# Patient Record
Sex: Female | Born: 1940 | Race: Black or African American | Hispanic: No | Marital: Single | Smoking: Never smoker
Health system: Southern US, Community
[De-identification: ages and names within clinical notes are randomized; demographics above are authoritative.]

## PROBLEM LIST (undated history)

## (undated) DIAGNOSIS — D509 Iron deficiency anemia, unspecified: Secondary | ICD-10-CM

## (undated) DIAGNOSIS — E785 Hyperlipidemia, unspecified: Secondary | ICD-10-CM

## (undated) DIAGNOSIS — E119 Type 2 diabetes mellitus without complications: Secondary | ICD-10-CM

## (undated) DIAGNOSIS — I10 Essential (primary) hypertension: Secondary | ICD-10-CM

## (undated) DIAGNOSIS — M1990 Unspecified osteoarthritis, unspecified site: Secondary | ICD-10-CM

## (undated) DIAGNOSIS — I2510 Atherosclerotic heart disease of native coronary artery without angina pectoris: Secondary | ICD-10-CM

## (undated) DIAGNOSIS — I2119 ST elevation (STEMI) myocardial infarction involving other coronary artery of inferior wall: Secondary | ICD-10-CM

## (undated) HISTORY — DX: Atherosclerotic heart disease of native coronary artery without angina pectoris: I25.10

## (undated) HISTORY — DX: Hyperlipidemia, unspecified: E78.5

## (undated) HISTORY — PX: CATARACT EXTRACTION: SUR2

## (undated) HISTORY — DX: Iron deficiency anemia, unspecified: D50.9

---

## 2007-06-21 ENCOUNTER — Ambulatory Visit: Payer: Self-pay | Admitting: Internal Medicine

## 2007-10-12 DIAGNOSIS — M1710 Unilateral primary osteoarthritis, unspecified knee: Secondary | ICD-10-CM

## 2007-10-12 DIAGNOSIS — K029 Dental caries, unspecified: Secondary | ICD-10-CM | POA: Insufficient documentation

## 2007-10-13 ENCOUNTER — Encounter (INDEPENDENT_AMBULATORY_CARE_PROVIDER_SITE_OTHER): Payer: Self-pay | Admitting: Internal Medicine

## 2007-10-13 ENCOUNTER — Telehealth (INDEPENDENT_AMBULATORY_CARE_PROVIDER_SITE_OTHER): Payer: Self-pay | Admitting: Internal Medicine

## 2007-10-27 ENCOUNTER — Ambulatory Visit: Payer: Self-pay | Admitting: Internal Medicine

## 2007-10-27 DIAGNOSIS — M79609 Pain in unspecified limb: Secondary | ICD-10-CM | POA: Insufficient documentation

## 2008-05-30 ENCOUNTER — Ambulatory Visit: Payer: Self-pay | Admitting: Internal Medicine

## 2008-05-30 ENCOUNTER — Encounter (INDEPENDENT_AMBULATORY_CARE_PROVIDER_SITE_OTHER): Payer: Self-pay | Admitting: Internal Medicine

## 2009-11-12 ENCOUNTER — Encounter: Admission: RE | Admit: 2009-11-12 | Discharge: 2009-11-12 | Payer: Self-pay | Admitting: General Practice

## 2011-01-04 ENCOUNTER — Encounter: Payer: Self-pay | Admitting: Internal Medicine

## 2011-05-11 ENCOUNTER — Emergency Department (HOSPITAL_COMMUNITY): Payer: Medicare Other

## 2011-05-11 ENCOUNTER — Encounter (HOSPITAL_COMMUNITY): Payer: Self-pay

## 2011-05-11 ENCOUNTER — Inpatient Hospital Stay (HOSPITAL_COMMUNITY)
Admission: EM | Admit: 2011-05-11 | Discharge: 2011-05-14 | DRG: 312 | Disposition: A | Discharge status: Home / Self Care | Payer: Medicare Other | Attending: Internal Medicine | Admitting: Internal Medicine

## 2011-05-11 DIAGNOSIS — D72829 Elevated white blood cell count, unspecified: Secondary | ICD-10-CM | POA: Diagnosis present

## 2011-05-11 DIAGNOSIS — R55 Syncope and collapse: Principal | ICD-10-CM | POA: Diagnosis present

## 2011-05-11 DIAGNOSIS — Z79899 Other long term (current) drug therapy: Secondary | ICD-10-CM

## 2011-05-11 DIAGNOSIS — I10 Essential (primary) hypertension: Secondary | ICD-10-CM | POA: Diagnosis present

## 2011-05-11 DIAGNOSIS — M6282 Rhabdomyolysis: Secondary | ICD-10-CM | POA: Diagnosis present

## 2011-05-11 DIAGNOSIS — M1990 Unspecified osteoarthritis, unspecified site: Secondary | ICD-10-CM | POA: Diagnosis present

## 2011-05-11 LAB — COMPREHENSIVE METABOLIC PANEL
ALT: 17 U/L (ref 0–35)
AST: 24 U/L (ref 0–37)
Albumin: 4.4 g/dL (ref 3.5–5.2)
Alkaline Phosphatase: 68 U/L (ref 39–117)
BUN: 16 mg/dL (ref 6–23)
CO2: 26 mEq/L (ref 19–32)
Calcium: 9.8 mg/dL (ref 8.4–10.5)
Chloride: 101 mEq/L (ref 96–112)
Creatinine, Ser: 0.98 mg/dL (ref 0.4–1.2)
GFR calc Af Amer: >60 mL/min (ref >60)
GFR calc non Af Amer: 56 mL/min — ABNORMAL LOW (ref >60)
Glucose, Bld: 112 mg/dL — ABNORMAL HIGH (ref 70–99)
Potassium: 3.4 mEq/L — ABNORMAL LOW (ref 3.5–5.1)
Sodium: 141 mEq/L (ref 135–145)
Total Bilirubin: 0.2 mg/dL — ABNORMAL LOW (ref 0.3–1.2)
Total Protein: 7.8 g/dL (ref 6.0–8.3)

## 2011-05-11 LAB — DIFFERENTIAL
Eosinophils Absolute: 0 10*3/uL (ref 0.0–0.7)
Eosinophils Relative: 0 % (ref 0–5)
Lymphs Abs: 1.6 10*3/uL (ref 0.7–4.0)
Monocytes Relative: 9 % (ref 3–12)

## 2011-05-11 LAB — URINALYSIS, ROUTINE W REFLEX MICROSCOPIC
Bilirubin Urine: NEGATIVE
Glucose, UA: NEGATIVE mg/dL
Hgb urine dipstick: NEGATIVE
Ketones, ur: NEGATIVE mg/dL
Nitrite: NEGATIVE
Protein, ur: NEGATIVE mg/dL
Specific Gravity, Urine: 1.02 (ref 1.005–1.030)
Urobilinogen, UA: 0.2 mg/dL (ref 0.0–1.0)
pH: 5 (ref 5.0–8.0)

## 2011-05-11 LAB — CARDIAC PANEL(CRET KIN+CKTOT+MB+TROPI)
CK, MB: 26.9 ng/mL (ref 0.3–4.0)
CK, MB: 24.1 ng/mL (ref 0.3–4.0)
Relative Index: 1.1 (ref 0.0–2.5)
Relative Index: 0.8 (ref 0.0–2.5)
Total CK: 3019 U/L — ABNORMAL HIGH (ref 7–177)
Troponin I: <0.3 ng/mL (ref <0.30)

## 2011-05-11 LAB — LIPID PANEL
Cholesterol: 236 mg/dL — ABNORMAL HIGH (ref 0–200)
HDL: 72 mg/dL (ref >39)
LDL Cholesterol: 146 mg/dL — ABNORMAL HIGH (ref 0–99)
Triglycerides: 88 mg/dL (ref <150)

## 2011-05-11 LAB — T3, FREE: T3, Free: 3 pg/mL (ref 2.3–4.2)

## 2011-05-11 LAB — CBC
MCH: 25.3 pg — ABNORMAL LOW (ref 26.0–34.0)
MCV: 75.8 fL — ABNORMAL LOW (ref 78.0–100.0)
Platelets: 164 10*3/uL (ref 150–400)
RBC: 4.99 MIL/uL (ref 3.87–5.11)
RDW: 15.1 % (ref 11.5–15.5)

## 2011-05-11 LAB — TSH: TSH: 0.274 u[IU]/mL — ABNORMAL LOW (ref 0.350–4.500)

## 2011-05-11 LAB — T4, FREE: Free T4: 1.14 ng/dL (ref 0.80–1.80)

## 2011-05-11 LAB — TROPONIN I: Troponin I: <0.3 ng/mL (ref <0.30)

## 2011-05-11 MED ORDER — IOHEXOL 300 MG/ML  SOLN
100.0000 mL | Freq: Once | INTRAMUSCULAR | Status: AC | PRN
  Administered 2011-05-11: 100 mL via INTRAVENOUS

## 2011-05-12 LAB — BASIC METABOLIC PANEL
CO2: 28 mEq/L (ref 19–32)
Calcium: 9 mg/dL (ref 8.4–10.5)
Chloride: 105 mEq/L (ref 96–112)
Creatinine, Ser: 0.72 mg/dL (ref 0.4–1.2)
GFR calc Af Amer: >60 mL/min (ref >60)
Sodium: 141 mEq/L (ref 135–145)

## 2011-05-12 LAB — DIFFERENTIAL
Basophils Absolute: 0 10*3/uL (ref 0.0–0.1)
Basophils Relative: 0 % (ref 0–1)
Eosinophils Absolute: 0.1 10*3/uL (ref 0.0–0.7)
Neutro Abs: 3.4 10*3/uL (ref 1.7–7.7)
Neutrophils Relative %: 47 % (ref 43–77)

## 2011-05-12 LAB — CBC
Hemoglobin: 11.8 g/dL — ABNORMAL LOW (ref 12.0–15.0)
MCH: 24.7 pg — ABNORMAL LOW (ref 26.0–34.0)
MCHC: 32.5 g/dL (ref 30.0–36.0)
Platelets: 170 10*3/uL (ref 150–400)
RBC: 4.78 MIL/uL (ref 3.87–5.11)

## 2011-05-12 LAB — CK: Total CK: 2474 U/L — ABNORMAL HIGH (ref 7–177)

## 2011-05-13 DIAGNOSIS — I059 Rheumatic mitral valve disease, unspecified: Secondary | ICD-10-CM

## 2011-05-13 DIAGNOSIS — R55 Syncope and collapse: Secondary | ICD-10-CM

## 2011-05-13 LAB — BASIC METABOLIC PANEL
CO2: 26 mEq/L (ref 19–32)
Chloride: 101 mEq/L (ref 96–112)
Glucose, Bld: 105 mg/dL — ABNORMAL HIGH (ref 70–99)
Potassium: 3.6 mEq/L (ref 3.5–5.1)
Sodium: 140 mEq/L (ref 135–145)

## 2011-05-13 LAB — CBC
HCT: 40.7 % (ref 36.0–46.0)
Hemoglobin: 13.2 g/dL (ref 12.0–15.0)
MCHC: 32.4 g/dL (ref 30.0–36.0)

## 2011-05-13 NOTE — H&P (Signed)
[name], [name]                ACCOUNT NO.:  [id number]  MEDICAL RECORD NO.:  [id number]           PATIENT TYPE:  E  LOCATION:  WLED                         FACILITY:  [facility]  PHYSICIAN:  Thad Ranger, MD       DATE OF BIRTH:  07/15/1941  DATE OF ADMISSION:  05/11/2011 DATE OF DISCHARGE:                             HISTORY & PHYSICAL   PRIMARY CARE PHYSICIAN:  Fleet Contras, M.D.  CHIEF COMPLAINT:  Syncopal episode.  HISTORY OF PRESENT ILLNESS:  [name] is a 70 year old African-American pleasant female who presented to [facility] Emergency Room with a syncopal episode today.  History was obtained from the patient who stated that she was at her friend son's graduation party yesterday.  She stated that she saw all her friends and was extremely happy and drank too much beer copying her friends.  She also stated that she usually does not drink that much and had last drink before this 2 years ago. She stated that she was extremely happy at the party and was just copying her friend.  She stated that she returned to her home at 10:30 p.m. and was hungry.  She had some carry out African food which she stated as "ate a lot" but then she did not drink enough water.  The patient stated that around midnight 12:00 a.m. she went to bed and at 3:30 a.m. she got up to use the bathroom when she felt very dizzy and lightheaded and passed out.  She had initially reported that she fell, however, the daughter who was accompanying the patient told the emergency room physician that she had passed out.  The patient stated that she had felt dizzy and lightheaded and a sharp pain in the back for a few seconds.  She fell otherwise she had no chest pain or any palpitations, fevers or chills, any diaphoresis.  The patient did have vomiting and was found in emesis by the EMS.  She had no urinary or bowel incontinence.  No seizure was reported.  The patient had no history of prior syncopal  episode.  REVIEW OF SYSTEMS:  Pertinent positives are dictated above, otherwise negative.  PAST MEDICAL HISTORY: 1. Hypertension. 2. Arthritis.  SOCIAL HISTORY:  The patient states that she seldom drinks and last drink was 2 years ago.  Denies any smoking or any drug abuse.  She currently lives at home with her family and is functional with her ADLs.  ALLERGIES:  No known drug allergies.  MEDICATIONS PRIOR TO ADMISSION:  Awaiting pharmacy med reconciliation.  PHYSICAL EXAMINATION:  VITAL SIGNS:  Blood pressure 135/60, pulse rate 72, respirations 20, temperature 98.4. GENERAL:  The patient is alert, awake and oriented x3, not in acute distress. HEENT:  Anicteric sclerae.  Pink conjunctivae.  Pupils reactive to light and accommodation.  EOMI. NECK:  Supple.  No lymphadenopathy.  No JVD. CVS:  S1, S2 clear.  Regular rate and rhythm. CHEST:  Clear to auscultation bilaterally. ABDOMEN:  Soft, nontender, nondistended.  Normal bowel sounds.  No flank pain.  No CV angle tenderness. EXTREMITIES:  No cyanosis, clubbing or edema noted  in upper or lowerextremities bilaterally. NEURO:  No focal neurological deficits noted.  The patient is alert and oriented x3, very pleasant and talkative.  Strength 5/5 in upper and lower extremities bilaterally.  LAB DIAGNOSTIC DATA:  CBC showed white count of 12.0, hemoglobin 12.6, hematocrit 37.8, platelets 164, neutrophils 78%, troponin less than 0.3, D-dimer elevated at 1.08.  UA negative for any UTI.  CMP showed sodium 141, potassium 3.4, BUN 16, creatinine 0.9.  LFTs essentially normal. CK elevated as 633, MB 10.3.  EKG currently unavailable to me, however, per ER rate 68, nonspecific intraventricular conduction delay, nonspecific ST-T wave changes.  IMPRESSION AND PLAN:  [name] is a 70 year old female with history of hypertension, arthritis, presenting with syncopal episode. 1. Syncope likely vasovagal from the patient's description,  however,     admits to rule out any cardiac cause.  We will admit the patient to     medicine service to tele monitored floor.  Obtain cardiac enzymes     x3 to rule out ACS.  The patient will have a 2-D echo and carotid     Doppler for further workup.  Will gently hydrate the patient.     Obtain orthostatics on admission and in a.m.. 2. Rhabdomyolysis with slight leukocytosis.  Likely rhabdomyolysis     from the fall.  We will continue to hydrate the patient.  Her renal     function is currently normal.  We will follow the CKs.  Mild     leukocytosis is likely secondary to stress demargination.  Patient     is afebrile.  UA is negative.  Chest x-ray does not show any     pneumonia.  Will continue to monitor the counts or if patient     develops any fevers. 3. Questionable thyroid mass.  Noticed the patient has mild leftward     deviation of the proximal trachea on the chest x-ray.  Will obtain     TSH, T4 and T3 levels and thyroid ultrasound for further workup. 4. Hypertension, currently stable.  Will continue home medications     when pharmacy med reconciliation is available. 5. History of osteoarthritis.  Continue p.r.n. pain medication. 6. DVT prophylaxis.  Lovenox for DVT prophylaxis.     Thad Ranger, MD     RR/MEDQ  D:  05/11/2011  T:  05/11/2011  Job:  [id number]  cc:   Fleet Contras, M.D. Fax: [phone]  Electronically Signed by RIPUDEEP RAI  on 05/13/2011 11:03:11 AM

## 2011-05-17 NOTE — Discharge Summary (Signed)
[name], [name]                ACCOUNT NO.:  [id number]  MEDICAL RECORD NO.:  [id number]           PATIENT TYPE:  I  LOCATION:  1426                         FACILITY:  [facility]  PHYSICIAN:  Kathlen Mody, MD       DATE OF BIRTH:  August 30, 1941  DATE OF ADMISSION:  05/11/2011 DATE OF DISCHARGE:  05/14/2011                              DISCHARGE SUMMARY   DISCHARGE DIAGNOSES: 1. Vasovagal syncope. 2. Rhabdomyolysis. 3. Hypertension. 4. Osteoarthritis.  DISCHARGE MEDICATIONS: 1. Cyclobenzaprine 5 mg 1 tablet q.h.s. p.r.n. 2. Naproxen 100 mg 1 tablet twice a day as needed. 3. Hydrochlorothiazide 12.5 mg 1 tablet daily. 4. Nucynta 50 mg 1 tablet t.i.d. p.r.n. 5. Latanoprost ophthalmic eye drops 1 drop q.h.s.  PERTINENT LABS:  On admission, the patient had a CBC which was significant for a WBC count of 12, hemoglobin of 12.6, hematocrit of 37.8, platelets of 164.  Troponin within normal limits.  D-dimers were high at 1.08.  Urinalysis negative for nitrites and leukocytes. Comprehensive metabolic panel significant for potassium of 3.4, glucose of 112, creatinine kinase was 633.  CK-MB was 10.3.  Lipid profile shows LDL of 146, total cholesterol of 236.  TSH was low, T3 and T4 were within normal limits.  Creatinine kinase was 2474 and repeat CK level on May 13, 2011, 1431.  Repeat CK level on May 14, 2011, was 668.  Basic metabolic panel within normal limits.  CBC showed a WBC count of 5.9, hemoglobin of 13.1, hematocrit of 40.7, platelets of 167.  RADIOLOGY:  The patient had a CT head without contrast showed mild cortical volume loss and mild small vessel ischemic microangiopathy, no evidence of traumatic intracranial injury or fracture.  Chest x-ray shows minimal basilar atelectasis and mild leftward deviation of proximal trachea, may reflect a thyroid mass.  Thyroid ultrasound would be helpful.  Ultrasound of the soft tissues of the neck shows tiny bilateral predominant cystic  thyroid nodule, too small to be palpable on physical examination.  No focal abnormalities.  No specific imaging. Followup is likely needed for either of these nodules.  CT angiogram of the chest was done, shows no evidence of PE or other acute findings.  Extrinsic mass compression on the trachea on chest x- ray findings likely related to phase of respiration and neck rotation.  CONSULTS:  None.  BRIEF HOSPITAL COURSE:  This is a 70 year old lady who was admitted to [facility] for an episode of syncope most likely vasovagal syncope from the history.  She was admitted to telemetry initially to rule out ACS. Cardiac enzymes were negative.  A 2-D echocardiogram showed good ejection fraction with an EF of 60% to 65% with no regional wall abnormalities.  Doppler parameters consistent with abnormal left ventricular relaxation.  Her carotid Dopplers were negative for any carotid stenosis.  She was adequately hydrated and the PT, OT consult was called, and she was able to ambulate without any symptoms of dizziness.  The syncope was most likely secondary to vasovagal and she was asked to adequately hydrate.  Rhabdomyolysis, most likely from the fall.  She was adequately hydrated with  normal saline.  Her renal functions were normal.  Her CK levels have been trending down.  Her latest CK level is 600.  Leukocytosis, most likely secondary to stress demargination.  The patient does not appear to be septic, she is afebrile, her leukocytosis has resolved and chest x-ray is negative, UA is negative for any infection.  Thyroid mass.  An ultrasound of the neck was obtained, showed tiny nodules.  No further workup needed.  A CT angiogram which showed this extensive mass compression on the trachea could be related to phase of respiration and/or neck rotation.  Hypertension, stable.  Continue with her home dose of home medications.  Osteoarthritis.  Continue with her p.r.n. pain medications.  On the  day of discharge, the patient's vitals include temperature of 98.4, pulse of 56, respirations 16, blood pressure 122/74, saturating 99% on room air.  DISCHARGE PHYSICAL EXAMINATION:  GENERAL:  On exam, she is alert, afebrile, oriented x3, comfortable, no acute distress. CARDIOVASCULAR EXAM:  S1 and S2 heard. RESPIRATORY EXAM:  Chest clear to auscultation bilaterally. ABDOMEN:  Soft, nontender, nondistended.  Bowel sounds are heard. EXTREMITIES:  No pedal edema.  The patient is hemodynamically stable for discharge.  She is recommended to follow up with her PCP in about 2 weeks.          ______________________________ Kathlen Mody, MD     VA/MEDQ  D:  05/14/2011  T:  05/14/2011  Job:  [id number]  Electronically Signed by Kathlen Mody MD on 05/17/2011 08:25:12 AM

## 2011-06-18 ENCOUNTER — Other Ambulatory Visit: Payer: Self-pay | Admitting: Internal Medicine

## 2011-06-18 DIAGNOSIS — Z1231 Encounter for screening mammogram for malignant neoplasm of breast: Secondary | ICD-10-CM

## 2011-06-23 ENCOUNTER — Ambulatory Visit
Admission: RE | Admit: 2011-06-23 | Discharge: 2011-06-23 | Disposition: A | Discharge status: Home / Self Care | Payer: Medicare Other | Source: Ambulatory Visit | Attending: Internal Medicine | Admitting: Internal Medicine

## 2011-06-23 DIAGNOSIS — Z1231 Encounter for screening mammogram for malignant neoplasm of breast: Secondary | ICD-10-CM

## 2011-06-26 ENCOUNTER — Other Ambulatory Visit: Payer: Self-pay | Admitting: Internal Medicine

## 2011-06-26 DIAGNOSIS — R928 Other abnormal and inconclusive findings on diagnostic imaging of breast: Secondary | ICD-10-CM

## 2011-07-02 ENCOUNTER — Ambulatory Visit
Admission: RE | Admit: 2011-07-02 | Discharge: 2011-07-02 | Disposition: A | Discharge status: Home / Self Care | Payer: Medicare Other | Source: Ambulatory Visit | Attending: Internal Medicine | Admitting: Internal Medicine

## 2011-07-02 ENCOUNTER — Other Ambulatory Visit: Payer: Self-pay | Admitting: Internal Medicine

## 2011-07-02 DIAGNOSIS — R928 Other abnormal and inconclusive findings on diagnostic imaging of breast: Secondary | ICD-10-CM

## 2011-07-09 ENCOUNTER — Inpatient Hospital Stay: Admission: RE | Admit: 2011-07-09 | Payer: Medicare Other | Source: Ambulatory Visit

## 2011-07-21 ENCOUNTER — Ambulatory Visit
Admission: RE | Admit: 2011-07-21 | Discharge: 2011-07-21 | Disposition: A | Discharge status: Home / Self Care | Payer: Medicare Other | Source: Ambulatory Visit | Attending: Internal Medicine | Admitting: Internal Medicine

## 2011-07-21 ENCOUNTER — Other Ambulatory Visit: Payer: Self-pay | Admitting: Internal Medicine

## 2011-07-21 DIAGNOSIS — R928 Other abnormal and inconclusive findings on diagnostic imaging of breast: Secondary | ICD-10-CM

## 2011-07-27 HISTORY — PX: BREAST BIOPSY: SHX20

## 2012-12-19 ENCOUNTER — Encounter (HOSPITAL_COMMUNITY): Payer: Self-pay | Admitting: Emergency Medicine

## 2012-12-19 ENCOUNTER — Emergency Department (HOSPITAL_COMMUNITY)
Admission: EM | Admit: 2012-12-19 | Discharge: 2012-12-19 | Disposition: A | Discharge status: Home / Self Care | Payer: Medicare Other | Attending: Emergency Medicine | Admitting: Emergency Medicine

## 2012-12-19 ENCOUNTER — Emergency Department (HOSPITAL_COMMUNITY): Payer: Medicare Other

## 2012-12-19 DIAGNOSIS — Y939 Activity, unspecified: Secondary | ICD-10-CM | POA: Insufficient documentation

## 2012-12-19 DIAGNOSIS — W108XXA Fall (on) (from) other stairs and steps, initial encounter: Secondary | ICD-10-CM | POA: Insufficient documentation

## 2012-12-19 DIAGNOSIS — Z79899 Other long term (current) drug therapy: Secondary | ICD-10-CM | POA: Insufficient documentation

## 2012-12-19 DIAGNOSIS — S46909A Unspecified injury of unspecified muscle, fascia and tendon at shoulder and upper arm level, unspecified arm, initial encounter: Secondary | ICD-10-CM | POA: Insufficient documentation

## 2012-12-19 DIAGNOSIS — Y929 Unspecified place or not applicable: Secondary | ICD-10-CM | POA: Insufficient documentation

## 2012-12-19 DIAGNOSIS — S0990XA Unspecified injury of head, initial encounter: Secondary | ICD-10-CM | POA: Insufficient documentation

## 2012-12-19 DIAGNOSIS — W19XXXA Unspecified fall, initial encounter: Secondary | ICD-10-CM

## 2012-12-19 DIAGNOSIS — I10 Essential (primary) hypertension: Secondary | ICD-10-CM | POA: Insufficient documentation

## 2012-12-19 DIAGNOSIS — G44309 Post-traumatic headache, unspecified, not intractable: Secondary | ICD-10-CM | POA: Insufficient documentation

## 2012-12-19 DIAGNOSIS — S4980XA Other specified injuries of shoulder and upper arm, unspecified arm, initial encounter: Secondary | ICD-10-CM | POA: Insufficient documentation

## 2012-12-19 HISTORY — DX: Essential (primary) hypertension: I10

## 2012-12-19 NOTE — ED Provider Notes (Signed)
History   CSN: [id number] Arrival date & time 12/19/12  1601 First MD Initiated Contact with Patient 12/19/12 1828      Chief Complaint  Patient presents with  . Fall  . Hip Pain  . Headache  . Shoulder Pain    HPI The patient presents emergency room after stumbling and falling down a couple of stairs appear the patient fell striking her head her left shoulder and her right hip. The injury occurred a few hours ago. Patient is able to walk.  She denies any loss of consciousness. She has not had any nausea vomiting.  Patient denies any chest pain or abdominal pain. She has no focal numbness or weakness Past Medical History  Diagnosis Date  . Hypertension     Past Surgical History  Procedure Date  . Cataract extraction     History reviewed. No pertinent family history.  History  Substance Use Topics  . Smoking status: Never Smoker   . Smokeless tobacco: Not on file  . Alcohol Use: No    OB History    Grav Para Term Preterm Abortions TAB SAB Ect Mult Living                  Review of Systems  All other systems reviewed and are negative.    Allergies  Review of patient's allergies indicates no known allergies.  Home Medications   Current Outpatient Rx  Name  Route  Sig  Dispense  Refill  . HYDROCHLOROTHIAZIDE 12.5 MG PO CAPS   Oral   Take 12.5 mg by mouth daily.         Marland Kitchen LATANOPROST 0.005 % OP SOLN   Both Eyes   Place 1 drop into both eyes at bedtime.         Marland Kitchen NAPROXEN 500 MG PO TBEC   Oral   Take 500 mg by mouth 2 (two) times daily with a meal.           BP 157/68  Pulse 77  Temp 98.7 F (37.1 C) (Oral)  Resp 18  SpO2 99%  Physical Exam  Nursing note and vitals reviewed. Constitutional: She appears well-developed and well-nourished. No distress.  HENT:  Head: Normocephalic.  Right Ear: External ear normal.  Left Ear: External ear normal.       Small contusion right posterior occiput  Eyes: Conjunctivae normal are normal. Right eye  exhibits no discharge. Left eye exhibits no discharge. No scleral icterus.  Neck: Neck supple. No spinous process tenderness present. No tracheal deviation present.  Cardiovascular: Normal rate, regular rhythm and intact distal pulses.   Pulmonary/Chest: Effort normal and breath sounds normal. No stridor. No respiratory distress. She has no wheezes. She has no rales.  Abdominal: Soft. Bowel sounds are normal. She exhibits no distension. There is no tenderness. There is no rebound and no guarding.  Musculoskeletal: She exhibits no edema and no tenderness.       Left shoulder: She exhibits normal range of motion, no tenderness, no deformity, no pain, no spasm and normal pulse.       Right hip: She exhibits tenderness. She exhibits normal range of motion, normal strength, no swelling and no deformity.       Cervical back: Normal.       Thoracic back: Normal.       Lumbar back: Normal.  Neurological: She is alert. She has normal strength. No sensory deficit. Cranial nerve deficit:  no gross defecits noted. She exhibits normal muscle tone.  She displays no seizure activity. Coordination normal.  Skin: Skin is warm and dry. No rash noted.  Psychiatric: She has a normal mood and affect.    ED Course  Procedures (including critical care time)  Labs Reviewed - No data to display Dg Hip Complete Right  12/19/2012  *RADIOLOGY REPORT*  Clinical Data: Fall, right hip pain  RIGHT HIP - COMPLETE 2+ VIEW  Comparison: None.  Findings: There is no evidence of fracture or dislocation.  There is no evidence of arthropathy or other focal bony abnormality. Soft tissues are unremarkable.  IMPRESSION: Negative.   Original Report Authenticated By: Davonna Belling, M.D.    Ct Head Wo Contrast  12/19/2012  *RADIOLOGY REPORT*  Clinical Data: Posterior head trauma.  Fall.  Headache.  CT HEAD WITHOUT CONTRAST  Technique:  Contiguous axial images were obtained from the base of the skull through the vertex without contrast.   Comparison: 05/10/2012.  Findings: No mass lesion, mass effect, midline shift, hydrocephalus, hemorrhage.  No territorial ischemia or acute infarction.  Left ethmoid mucous retention cyst or polyp. Intracranial atherosclerosis.  IMPRESSION: Negative CT brain.   Original Report Authenticated By: Andreas Newport, M.D.    Dg Shoulder Left  12/19/2012  *RADIOLOGY REPORT*  Clinical Data: Larey Seat,  left shoulder pain.  LEFT SHOULDER - 2+ VIEW  Comparison: None.  Findings: Mild degenerative change at the acromioclavicular joint and glenohumeral joint.  There is no fracture or dislocation.  IMPRESSION: As above.   Original Report Authenticated By: Davonna Belling, M.D.       MDM  Patient does not appear to have any serious injuries associated with her fall.  At this time there does not appear to be any evidence of an acute emergency medical condition and the patient appears stable for discharge with appropriate outpatient follow up.         Celene Kras, MD 12/19/12 [phone]

## 2012-12-19 NOTE — ED Notes (Signed)
Patient slipped down the steps and fell. Patient c/o pain to right hip, left shoulder, and a bump on the posterior right side of her head. Patient denies taking blood thinners. Patient states she has been able to ambulate since incident. Patient also c/o right shoulder pain that started after arriving to hospital.

## 2012-12-19 NOTE — ED Notes (Addendum)
Pt states that she fell around 1430 today.  Denies LOC.  C/o bump on back of head and right hip pain, lt shoulder pain.  Small knot noted to back of head.  Pt is A&O x 4 and denies blood thinners.

## 2012-12-23 ENCOUNTER — Encounter (HOSPITAL_COMMUNITY): Payer: Self-pay | Admitting: Emergency Medicine

## 2012-12-23 ENCOUNTER — Emergency Department (HOSPITAL_COMMUNITY)
Admission: EM | Admit: 2012-12-23 | Discharge: 2012-12-23 | Disposition: A | Discharge status: Home / Self Care | Payer: Medicare Other | Attending: Emergency Medicine | Admitting: Emergency Medicine

## 2012-12-23 DIAGNOSIS — R55 Syncope and collapse: Secondary | ICD-10-CM

## 2012-12-23 DIAGNOSIS — Z8739 Personal history of other diseases of the musculoskeletal system and connective tissue: Secondary | ICD-10-CM | POA: Insufficient documentation

## 2012-12-23 DIAGNOSIS — Z79899 Other long term (current) drug therapy: Secondary | ICD-10-CM | POA: Insufficient documentation

## 2012-12-23 DIAGNOSIS — I10 Essential (primary) hypertension: Secondary | ICD-10-CM | POA: Insufficient documentation

## 2012-12-23 HISTORY — DX: Unspecified osteoarthritis, unspecified site: M19.90

## 2012-12-23 LAB — URINALYSIS, ROUTINE W REFLEX MICROSCOPIC
Bilirubin Urine: NEGATIVE
Glucose, UA: NEGATIVE mg/dL
Ketones, ur: NEGATIVE mg/dL
Specific Gravity, Urine: 1.021 (ref 1.005–1.030)
pH: 5.5 (ref 5.0–8.0)

## 2012-12-23 LAB — BASIC METABOLIC PANEL
BUN: 14 mg/dL (ref 6–23)
CO2: 30 mEq/L (ref 19–32)
Chloride: 101 mEq/L (ref 96–112)
Creatinine, Ser: 0.8 mg/dL (ref 0.50–1.10)

## 2012-12-23 LAB — CBC
HCT: 38.9 % (ref 36.0–46.0)
Hemoglobin: 13.1 g/dL (ref 12.0–15.0)
MCV: 75.4 fL — ABNORMAL LOW (ref 78.0–100.0)
RBC: 5.16 MIL/uL — ABNORMAL HIGH (ref 3.87–5.11)
WBC: 8.6 10*3/uL (ref 4.0–10.5)

## 2012-12-23 LAB — URINE MICROSCOPIC-ADD ON

## 2012-12-23 NOTE — ED Provider Notes (Signed)
History     CSN: [id number]  Arrival date & time 12/23/12  1919   First MD Initiated Contact with Patient 12/23/12 1928      Chief Complaint  Patient presents with  . Loss of Consciousness    (Consider location/radiation/quality/duration/timing/severity/associated sxs/prior treatment) Patient is a 72 y.o. female presenting with syncope. The history is provided by the patient and a relative.  Loss of Consciousness Pertinent negatives include no chest pain, no abdominal pain, no headaches and no shortness of breath.  pt states riding in car with family. Then noted she began to feel nauseated. Then felt hot/flushed. Then briefly fainted. Notes similar symptoms 1 x in past. No recent fainting. No injury or fall, was sitting in car. No preceding palpitations or sense of irregular heart beat. No hx dysrhythmia or cad. No current or recent cp or discomfort of any sort. No unusual fatigue or doe. No headache. No problems w speech or vision. No numbness/weakness. No abd pain. No vomiting or diarrhea. No dysuria or gu c/o. No cough or uri c/o. States had eaten very little today, hungry now. No recent change in meds or new meds.     Past Medical History  Diagnosis Date  . Hypertension   . Arthritis     Past Surgical History  Procedure Date  . Cataract extraction     No family history on file.  History  Substance Use Topics  . Smoking status: Never Smoker   . Smokeless tobacco: Not on file  . Alcohol Use: No    OB History    Grav Para Term Preterm Abortions TAB SAB Ect Mult Living                  Review of Systems  Constitutional: Negative for fever and chills.  HENT: Negative for neck pain.   Eyes: Negative for redness and visual disturbance.  Respiratory: Negative for shortness of breath.   Cardiovascular: Positive for syncope. Negative for chest pain, palpitations and leg swelling.  Gastrointestinal: Negative for abdominal pain.  Genitourinary: Negative for dysuria and  flank pain.  Musculoskeletal: Negative for back pain.  Skin: Negative for rash.  Neurological: Negative for weakness, numbness and headaches.  Hematological: Does not bruise/bleed easily.  Psychiatric/Behavioral: Negative for confusion.    Allergies  Review of patient's allergies indicates no known allergies.  Home Medications   Current Outpatient Rx  Name  Route  Sig  Dispense  Refill  . HYDROCHLOROTHIAZIDE 12.5 MG PO CAPS   Oral   Take 12.5 mg by mouth daily.         Marland Kitchen LATANOPROST 0.005 % OP SOLN   Both Eyes   Place 1 drop into both eyes at bedtime.         Marland Kitchen NAPROXEN 500 MG PO TBEC   Oral   Take 500 mg by mouth 2 (two) times daily with a meal.           BP 121/57  Pulse 60  Temp 98 F (36.7 C) (Oral)  Resp 18  SpO2 100%  Physical Exam  Nursing note and vitals reviewed. Constitutional: She is oriented to person, place, and time. She appears well-developed and well-nourished. No distress.  HENT:  Mouth/Throat: Oropharynx is clear and moist.  Eyes: Conjunctivae normal are normal. Pupils are equal, round, and reactive to light. No scleral icterus.  Neck: Neck supple. No tracheal deviation present.  Cardiovascular: Normal rate, regular rhythm, normal heart sounds and intact distal pulses.  Exam reveals  no gallop and no friction rub.   No murmur heard. Pulmonary/Chest: Effort normal and breath sounds normal. No respiratory distress.  Abdominal: Soft. Normal appearance and bowel sounds are normal. She exhibits no distension. There is no tenderness.  Genitourinary:       No cva tenderness  Musculoskeletal: She exhibits no edema and no tenderness.  Neurological: She is alert and oriented to person, place, and time. No cranial nerve deficit.       Motor intact bil.   Skin: Skin is warm and dry. No rash noted.  Psychiatric: She has a normal mood and affect.    ED Course  Procedures (including critical care time)  Results for orders placed during the hospital  encounter of 12/23/12  CBC      Component Value Range   WBC 8.6  4.0 - 10.5 K/uL   RBC 5.16 (*) 3.87 - 5.11 MIL/uL   Hemoglobin 13.1  12.0 - 15.0 g/dL   HCT 16.1  09.6 - 04.5 %   MCV 75.4 (*) 78.0 - 100.0 fL   MCH 25.4 (*) 26.0 - 34.0 pg   MCHC 33.7  30.0 - 36.0 g/dL   RDW 40.9  81.1 - 91.4 %   Platelets 165  150 - 400 K/uL  BASIC METABOLIC PANEL      Component Value Range   Sodium 142  135 - 145 mEq/L   Potassium 4.7  3.5 - 5.1 mEq/L   Chloride 101  96 - 112 mEq/L   CO2 30  19 - 32 mEq/L   Glucose, Bld 116 (*) 70 - 99 mg/dL   BUN 14  6 - 23 mg/dL   Creatinine, Ser 7.82  0.50 - 1.10 mg/dL   Calcium 95.6  8.4 - 21.3 mg/dL   GFR calc non Af Amer 74 (*) >90 mL/min   GFR calc Af Amer 85 (*) >90 mL/min  URINALYSIS, ROUTINE W REFLEX MICROSCOPIC      Component Value Range   Color, Urine YELLOW  YELLOW   APPearance HAZY (*) CLEAR   Specific Gravity, Urine 1.021  1.005 - 1.030   pH 5.5  5.0 - 8.0   Glucose, UA NEGATIVE  NEGATIVE mg/dL   Hgb urine dipstick NEGATIVE  NEGATIVE   Bilirubin Urine NEGATIVE  NEGATIVE   Ketones, ur NEGATIVE  NEGATIVE mg/dL   Protein, ur NEGATIVE  NEGATIVE mg/dL   Urobilinogen, UA 0.2  0.0 - 1.0 mg/dL   Nitrite NEGATIVE  NEGATIVE   Leukocytes, UA SMALL (*) NEGATIVE  URINE MICROSCOPIC-ADD ON      Component Value Range   Squamous Epithelial / LPF FEW (*) RARE   WBC, UA 3-6  <3 WBC/hpf   Bacteria, UA RARE  RARE   Urine-Other MUCOUS PRESENT        MDM  Iv ns. Monitor.   Labs.  Reviewed nursing notes and prior charts for additional history.   Notes reviewed ?prior eval for vasovagal syncope w extensive workup then, neg for acute process   Date: 12/23/2012  Rate: 59  Rhythm: normal sinus rhythm  QRS Axis: normal  Intervals: normal  ST/T Wave abnormalities: normal  Conduction Disutrbances:none  Narrative Interpretation:   Old EKG Reviewed: unchanged  Recheck remains asymptomatic. Ambulatory. bp stable, no dysrhythmia on monitor. Symptoms  appear c/w vasovagal episode.  Pt appears stable for d/c. pcp w [facility], discussed close pcp f/u.         Suzi Roots, MD 12/23/12 2214

## 2012-12-23 NOTE — ED Notes (Signed)
Dr. Steinl at bedside 

## 2012-12-23 NOTE — ED Notes (Signed)
Per Pt's son pt passed out in the car and lost consciousness for 5-64mins. He states "She looked like she just started sleeping, and she wasn't responding". Pt was c/o feeling "sick", stated she felt like she needed to use the bathroom, and she was "hot".

## 2012-12-23 NOTE — ED Notes (Addendum)
Per EMS pt had a syncope episode with loc for . Family states pt was diaphoretic, felt weak, and had a stomach ache. Pt was A&O x 4 when EMS arrived. Pt was seen at [facility] two days ago for syncopal episode. Vitals 132/80, 62 P irregular, 100% RA, denies pain. 20g LH.

## 2012-12-23 NOTE — ED Notes (Signed)
Discharge instructions reviewed. Pt verbalized understanding.  

## 2014-03-25 ENCOUNTER — Ambulatory Visit (HOSPITAL_COMMUNITY): Admit: 2014-03-25 | Payer: Self-pay | Admitting: Interventional Cardiology

## 2014-03-25 ENCOUNTER — Encounter (HOSPITAL_COMMUNITY): Payer: Self-pay | Admitting: Internal Medicine

## 2014-03-25 ENCOUNTER — Inpatient Hospital Stay (HOSPITAL_COMMUNITY): Payer: Medicare Other

## 2014-03-25 ENCOUNTER — Inpatient Hospital Stay (HOSPITAL_COMMUNITY)
Admission: EM | Admit: 2014-03-25 | Discharge: 2014-03-27 | DRG: 247 | Disposition: A | Discharge status: Home / Self Care | Payer: Medicare Other | Attending: Interventional Cardiology | Admitting: Interventional Cardiology

## 2014-03-25 ENCOUNTER — Encounter (HOSPITAL_COMMUNITY)
Admission: EM | Disposition: A | Discharge status: Home / Self Care | Payer: Medicare Other | Source: Home / Self Care | Attending: Interventional Cardiology

## 2014-03-25 DIAGNOSIS — Z955 Presence of coronary angioplasty implant and graft: Secondary | ICD-10-CM

## 2014-03-25 DIAGNOSIS — I252 Old myocardial infarction: Secondary | ICD-10-CM | POA: Diagnosis present

## 2014-03-25 DIAGNOSIS — I498 Other specified cardiac arrhythmias: Secondary | ICD-10-CM | POA: Diagnosis present

## 2014-03-25 DIAGNOSIS — M129 Arthropathy, unspecified: Secondary | ICD-10-CM | POA: Diagnosis present

## 2014-03-25 DIAGNOSIS — D649 Anemia, unspecified: Secondary | ICD-10-CM

## 2014-03-25 DIAGNOSIS — D509 Iron deficiency anemia, unspecified: Secondary | ICD-10-CM | POA: Diagnosis present

## 2014-03-25 DIAGNOSIS — E785 Hyperlipidemia, unspecified: Secondary | ICD-10-CM | POA: Diagnosis present

## 2014-03-25 DIAGNOSIS — I213 ST elevation (STEMI) myocardial infarction of unspecified site: Secondary | ICD-10-CM

## 2014-03-25 DIAGNOSIS — I2119 ST elevation (STEMI) myocardial infarction involving other coronary artery of inferior wall: Secondary | ICD-10-CM

## 2014-03-25 DIAGNOSIS — I10 Essential (primary) hypertension: Secondary | ICD-10-CM

## 2014-03-25 DIAGNOSIS — M7750 Other enthesopathy of unspecified foot: Secondary | ICD-10-CM

## 2014-03-25 DIAGNOSIS — I2510 Atherosclerotic heart disease of native coronary artery without angina pectoris: Secondary | ICD-10-CM

## 2014-03-25 DIAGNOSIS — E876 Hypokalemia: Secondary | ICD-10-CM | POA: Diagnosis not present

## 2014-03-25 DIAGNOSIS — Z79899 Other long term (current) drug therapy: Secondary | ICD-10-CM

## 2014-03-25 HISTORY — PX: PERCUTANEOUS CORONARY STENT INTERVENTION (PCI-S): SHX5485

## 2014-03-25 HISTORY — DX: ST elevation (STEMI) myocardial infarction involving other coronary artery of inferior wall: I21.19

## 2014-03-25 HISTORY — PX: CORONARY ANGIOPLASTY WITH STENT PLACEMENT: SHX49

## 2014-03-25 HISTORY — PX: LEFT HEART CATH: SHX5478

## 2014-03-25 LAB — BASIC METABOLIC PANEL
BUN: 12 mg/dL (ref 6–23)
CHLORIDE: 103 meq/L (ref 96–112)
CO2: 25 mEq/L (ref 19–32)
Calcium: 9.4 mg/dL (ref 8.4–10.5)
Creatinine, Ser: 0.98 mg/dL (ref 0.50–1.10)
GFR calc non Af Amer: 57 mL/min — ABNORMAL LOW (ref >90)
GFR, EST AFRICAN AMERICAN: 66 mL/min — AB (ref >90)
GLUCOSE: 136 mg/dL — AB (ref 70–99)
POTASSIUM: 3.4 meq/L — AB (ref 3.7–5.3)
Sodium: 143 mEq/L (ref 137–147)

## 2014-03-25 LAB — DIFFERENTIAL
Basophils Absolute: 0 10*3/uL (ref 0.0–0.1)
Basophils Relative: 1 % (ref 0–1)
EOS ABS: 0.1 10*3/uL (ref 0.0–0.7)
EOS PCT: 2 % (ref 0–5)
LYMPHS ABS: 4.6 10*3/uL — AB (ref 0.7–4.0)
Lymphocytes Relative: 59 % — ABNORMAL HIGH (ref 12–46)
MONO ABS: 0.6 10*3/uL (ref 0.1–1.0)
MONOS PCT: 7 % (ref 3–12)
Neutro Abs: 2.5 10*3/uL (ref 1.7–7.7)
Neutrophils Relative %: 32 % — ABNORMAL LOW (ref 43–77)

## 2014-03-25 LAB — CBC
HCT: 35.8 % — ABNORMAL LOW (ref 36.0–46.0)
HEMOGLOBIN: 12 g/dL (ref 12.0–15.0)
MCH: 25.8 pg — AB (ref 26.0–34.0)
MCHC: 33.5 g/dL (ref 30.0–36.0)
MCV: 77 fL — AB (ref 78.0–100.0)
Platelets: 149 10*3/uL — ABNORMAL LOW (ref 150–400)
RBC: 4.65 MIL/uL (ref 3.87–5.11)
RDW: 15.5 % (ref 11.5–15.5)
WBC: 7.9 10*3/uL (ref 4.0–10.5)

## 2014-03-25 LAB — APTT: aPTT: 53 seconds — ABNORMAL HIGH (ref 24–37)

## 2014-03-25 LAB — POCT I-STAT TROPONIN I: Troponin i, poc: 0.01 ng/mL (ref 0.00–0.08)

## 2014-03-25 LAB — PROTIME-INR
INR: 1.22 (ref 0.00–1.49)
Prothrombin Time: 15.1 seconds (ref 11.6–15.2)

## 2014-03-25 LAB — MRSA PCR SCREENING: MRSA by PCR: NEGATIVE

## 2014-03-25 SURGERY — LEFT HEART CATH
Anesthesia: LOCAL

## 2014-03-25 MED ORDER — MORPHINE SULFATE 2 MG/ML IJ SOLN: 2.0000 mg | INTRAMUSCULAR | Status: DC | PRN

## 2014-03-25 MED ORDER — TICAGRELOR 90 MG PO TABS
ORAL_TABLET | ORAL | Status: AC
  Administered 2014-03-26: 90 mg via ORAL
  Filled 2014-03-25: qty 1

## 2014-03-25 MED ORDER — ASPIRIN EC 81 MG PO TBEC: 81.0000 mg | DELAYED_RELEASE_TABLET | Freq: Every day | ORAL | Status: DC

## 2014-03-25 MED ORDER — ATORVASTATIN CALCIUM 40 MG PO TABS
40.0000 mg | ORAL_TABLET | Freq: Every day | ORAL | Status: DC
  Administered 2014-03-26: 40 mg via ORAL
  Filled 2014-03-25 (×2): qty 1

## 2014-03-25 MED ORDER — HEART ATTACK BOUNCING BOOK
Freq: Once | Status: AC
  Administered 2014-03-25: 1
  Filled 2014-03-25: qty 1

## 2014-03-25 MED ORDER — VERAPAMIL HCL 2.5 MG/ML IV SOLN
INTRAVENOUS | Status: AC
  Filled 2014-03-25: qty 2

## 2014-03-25 MED ORDER — TICAGRELOR 90 MG PO TABS
90.0000 mg | ORAL_TABLET | Freq: Two times a day (BID) | ORAL | Status: DC
Start: 2014-03-26 — End: 2014-03-27
  Administered 2014-03-26 – 2014-03-27 (×3): 90 mg via ORAL
  Filled 2014-03-25 (×4): qty 1

## 2014-03-25 MED ORDER — LIDOCAINE HCL (PF) 1 % IJ SOLN
INTRAMUSCULAR | Status: AC
  Filled 2014-03-25: qty 30

## 2014-03-25 MED ORDER — EXERCISE FOR HEART AND HEALTH BOOK
Freq: Once | Status: DC
  Filled 2014-03-25: qty 1

## 2014-03-25 MED ORDER — HEPARIN SODIUM (PORCINE) 1000 UNIT/ML IJ SOLN
4000.0000 [IU] | Freq: Once | INTRAMUSCULAR | Status: DC
  Filled 2014-03-25: qty 4

## 2014-03-25 MED ORDER — ASPIRIN 81 MG PO CHEW
81.0000 mg | CHEWABLE_TABLET | Freq: Every day | ORAL | Status: DC
  Administered 2014-03-26 – 2014-03-27 (×2): 81 mg via ORAL
  Filled 2014-03-25 (×2): qty 1

## 2014-03-25 MED ORDER — HEPARIN (PORCINE) IN NACL 2-0.9 UNIT/ML-% IJ SOLN
INTRAMUSCULAR | Status: AC
  Filled 2014-03-25: qty 1000

## 2014-03-25 MED ORDER — TICAGRELOR 90 MG PO TABS
ORAL_TABLET | ORAL | Status: AC
  Filled 2014-03-25: qty 1

## 2014-03-25 MED ORDER — NITROGLYCERIN 0.4 MG SL SUBL: 0.4000 mg | SUBLINGUAL_TABLET | SUBLINGUAL | Status: DC | PRN

## 2014-03-25 MED ORDER — SODIUM CHLORIDE 0.9 % IV SOLN
INTRAVENOUS | Status: AC
  Administered 2014-03-25: 23:00:00 via INTRAVENOUS

## 2014-03-25 MED ORDER — OXYCODONE-ACETAMINOPHEN 5-325 MG PO TABS
1.0000 | ORAL_TABLET | ORAL | Status: DC | PRN
  Administered 2014-03-26: 1 via ORAL
  Filled 2014-03-25: qty 1

## 2014-03-25 MED ORDER — NITROGLYCERIN 0.2 MG/ML ON CALL CATH LAB
INTRAVENOUS | Status: AC
  Filled 2014-03-25: qty 1

## 2014-03-25 MED ORDER — SODIUM CHLORIDE 0.9 % IV SOLN: INTRAVENOUS | Status: DC

## 2014-03-25 MED ORDER — ATROPINE SULFATE 0.1 MG/ML IJ SOLN
INTRAMUSCULAR | Status: AC
  Filled 2014-03-25: qty 10

## 2014-03-25 MED ORDER — ASPIRIN 81 MG PO CHEW
324.0000 mg | CHEWABLE_TABLET | ORAL | Status: AC
  Administered 2014-03-25: 324 mg via ORAL
  Filled 2014-03-25: qty 4

## 2014-03-25 MED ORDER — BIVALIRUDIN 250 MG IV SOLR
INTRAVENOUS | Status: AC
  Filled 2014-03-25: qty 250

## 2014-03-25 MED ORDER — HEPARIN SODIUM (PORCINE) 5000 UNIT/ML IJ SOLN
INTRAMUSCULAR | Status: AC
  Administered 2014-03-25: 4000 [IU]
  Filled 2014-03-25: qty 1

## 2014-03-25 MED ORDER — LATANOPROST 0.005 % OP SOLN
1.0000 [drp] | Freq: Every day | OPHTHALMIC | Status: DC
  Administered 2014-03-25 – 2014-03-26 (×2): 1 [drp] via OPHTHALMIC
  Filled 2014-03-25: qty 2.5

## 2014-03-25 MED ORDER — ASPIRIN 300 MG RE SUPP
300.0000 mg | RECTAL | Status: AC
  Filled 2014-03-25: qty 1

## 2014-03-25 MED ORDER — HEPARIN SODIUM (PORCINE) 5000 UNIT/ML IJ SOLN
5000.0000 [IU] | Freq: Three times a day (TID) | INTRAMUSCULAR | Status: DC
  Administered 2014-03-26 – 2014-03-27 (×4): 5000 [IU] via SUBCUTANEOUS
  Filled 2014-03-25 (×8): qty 1

## 2014-03-25 NOTE — ED Provider Notes (Signed)
I saw and evaluated the patient, reviewed the resident's note and I agree with the findings and plan.  Pt presented to the ED as a STEMI activation.  Cardiology was at the bedside upon arrival.   EKG consistent with STEMI.  Pt was taken up to the cath lab in stable condition.  Celene Kras, MD 03/25/14 [phone]

## 2014-03-25 NOTE — CV Procedure (Signed)
Left Heart Catheterization with Coronary Angiography and PCI Report  Lynn CanadaFenella Whitehead  73 y.o.  female 10/12/1941  Procedure Date: 03/25/2014 Referring Physician: Chelsea PrimusEdwin Avebere, , M.D. Primary Cardiologist:: Alanda AmassHenry WB Leia AlfSmith, III, M.D.  INDICATIONS: Inferior ST elevation MI  PROCEDURE: 1. Left heart catheterization; 2. Coronary angiography; 3. Left ventriculography; 4. Drug-eluting stent implantation mid right coronary x2, overlapping  CONSENT:  The risks, benefits, and details of the procedure were explained in detail to the patient. Risks including death, stroke, heart attack, kidney injury, allergy, limb ischemia, bleeding and radiation injury were discussed.  The patient verbalized understanding and wanted to proceed.  Informed written consent was obtained.  PROCEDURE TECHNIQUE:  After Xylocaine anesthesia a 5 French Slender sheath was placed in the right radial artery with an angiocath and the modified Seldinger technique.  Coronary angiography was done using a 5 F JL4 diagnostic and JR 4 guide catheters.  Left ventriculography was done using the JR 4 guide catheter and hand injection.   The culprit vessel is identified to be the right coronary which is totally occluded in the mid segment. She was loaded Brilinta, 180 mg orally, and an IV bolus and infusion a bivalirudin. A CT was documented greater than 300. The JR 4 guide catheter easily engage the right coronary. A Pro-water 0.014 wire easily crossed the mid right coronary total occlusion. Predilatation with a 2 5 x 12 mm Trek led to reperfusion. We then positioned and deployed a 27 5 x 23 mm long Xience Alpine. Due to respiratory and cardiac induced stent motion partial geographic miss of the predilatation territory occurred. A second Xience Alpine was overlapped with the proximal margin of the initial stent. This was a 3.0 x 8 mm long drug-eluting stent deployed at 14 atmospheres. The initial stent was also deployed at 14 atmospheres.  Postdilatation was performed with a 3.0 x 15 mm Vernon Euphora balloon to 14 atmospheres within the entire stented segment.. The final result was excellent with TIMI grade 3 flow noted.  Atropine 0.5 mg was administered after the first balloon inflation reestablished antegrade blood flow and produced a Bezold Jarisch reflex. The atropine and IV fluids reversed the reflex.   CONTRAST:  Total of 175 cc.  COMPLICATIONS:  None   HEMODYNAMICS:  Aortic pressure 126/60 mmHg; LV pressure 130/16 mmHg; LVEDP 20 mm mercury  ANGIOGRAPHIC DATA:   The left main coronary artery is widely patent.  The left anterior descending artery is widely patent and transapical. There is proximal to mid irregularities but no high-grade obstruction is noted..  The left circumflex artery is widely patent. 3 obtuse of free of any significant obstruction.  The ramus intermedius is diffusely diseased and contains a 70 to 80% ostial/proximal stenosis and a mid 95% stenosis. Likely this vessel is relatively small in vascular territory supplied.  The right coronary artery is 100% occluded in the mid vessel.  PCI RESULTS: The right coronary was 100% occluded in the midsegment and following PCI with overlapping stent implantation and post dilatation to 3.0 mm in diameter, 0% stenosis was noted to TIMI grade 3 flow was reestablished.  LEFT VENTRICULOGRAM:  Left ventricular angiogram was done in the 30 RAO projection and revealed minimal inferobasal hypokinesis. EF 60%.   IMPRESSIONS:  1. Acute inferior ST elevation microinfarction related to acute occlusion of the mid right coronary  2. Successful drug-eluting stent implantation in the mid right coronary, overlapping and post dilated to 3.0 mm in diameter.  3. Severe ramus  intermedius disease. This vessel is relatively small in distribution and can easily be treated with medical therapy. There is irregularity in the mid LAD but no significant obstruction is noted in the LAD or  other vascular territories not already mentioned.  4. Overall normal LV function with inferobasal hypokinesis. EF 60%   RECOMMENDATION:   1. Aspirin and Brilinta 2. Statin therapy 3. Consider beta blocker therapy within the next 12-24 hours depending upon heart rate and blood pressure 4.Candidate for discharge in 48-72 hours .

## 2014-03-25 NOTE — H&P (Signed)
[name] is an 73 y.o. female.     Chief Complaint: inferior STEMI HPI: [name] is a 73 yo woman with PMH of hypertension who was able to go to the beach today [facility] with her family but developed sudden chest pain at home where she slumped and had brief 30 seconds to 1 minute loss of consciousness. She did not hit her head. EMS called and STEMI activation in the field. On arrival, [name] felt improved but had inferior STEMI. She denied hitting her head; she had some associated SOB and chest pain improved with aspirin. In the ER she received 4000 units of IV heparin. Cath lab arrived soon and urgent cardiac catheterization performed.   Past Medical History  Diagnosis Date  . Hypertension   . Arthritis     Past Surgical History  Procedure Laterality Date  . Cataract extraction      History reviewed. No pertinent family history. Social History:  reports that she has never smoked. She does not have any smokeless tobacco history on file. She reports that she does not drink alcohol or use illicit drugs. No known family history of CAD Allergies: No Known Allergies  Medications Prior to Admission  Medication Sig Dispense Refill  . furosemide (LASIX) 40 MG tablet Take 20 mg by mouth daily as needed (Swelling).      . hydrochlorothiazide (MICROZIDE) 12.5 MG capsule Take 12.5 mg by mouth daily.      Marland Kitchen latanoprost (XALATAN) 0.005 % ophthalmic solution Place 1 drop into both eyes at bedtime.      . naproxen (EC NAPROSYN) 500 MG EC tablet Take 500 mg by mouth 2 (two) times daily as needed. For pain/inflammation        Results for orders placed during the hospital encounter of 03/25/14 (from the past 48 hour(s))  CBC     Status: Abnormal   Collection Time    03/25/14  8:08 PM      Result Value Ref Range   WBC 7.9  4.0 - 10.5 K/uL   RBC 4.65  3.87 - 5.11 MIL/uL   Hemoglobin 12.0  12.0 - 15.0 g/dL   HCT 35.8 (*) 36.0 - 46.0 %   MCV 77.0 (*) 78.0 - 100.0 fL   MCH 25.8 (*) 26.0 -  34.0 pg   MCHC 33.5  30.0 - 36.0 g/dL   RDW 15.5  11.5 - 15.5 %   Platelets 149 (*) 150 - 400 K/uL  DIFFERENTIAL     Status: Abnormal   Collection Time    03/25/14  8:08 PM      Result Value Ref Range   Neutrophils Relative % 32 (*) 43 - 77 %   Neutro Abs 2.5  1.7 - 7.7 K/uL   Lymphocytes Relative 59 (*) 12 - 46 %   Lymphs Abs 4.6 (*) 0.7 - 4.0 K/uL   Monocytes Relative 7  3 - 12 %   Monocytes Absolute 0.6  0.1 - 1.0 K/uL   Eosinophils Relative 2  0 - 5 %   Eosinophils Absolute 0.1  0.0 - 0.7 K/uL   Basophils Relative 1  0 - 1 %   Basophils Absolute 0.0  0.0 - 0.1 K/uL  PROTIME-INR     Status: None   Collection Time    03/25/14  8:08 PM      Result Value Ref Range   Prothrombin Time 15.1  11.6 - 15.2 seconds   INR 1.22  0.00 - 1.49  APTT     Status: Abnormal   Collection Time    03/25/14  8:08 PM      Result Value Ref Range   aPTT 53 (*) 24 - 37 seconds   Comment:            IF BASELINE aPTT IS ELEVATED,     SUGGEST PATIENT RISK ASSESSMENT     BE USED TO DETERMINE APPROPRIATE     ANTICOAGULANT THERAPY.  BASIC METABOLIC PANEL     Status: Abnormal   Collection Time    03/25/14  8:08 PM      Result Value Ref Range   Sodium 143  137 - 147 mEq/L   Potassium 3.4 (*) 3.7 - 5.3 mEq/L   Chloride 103  96 - 112 mEq/L   CO2 25  19 - 32 mEq/L   Glucose, Bld 136 (*) 70 - 99 mg/dL   BUN 12  6 - 23 mg/dL   Creatinine, Ser 0.98  0.50 - 1.10 mg/dL   Calcium 9.4  8.4 - 10.5 mg/dL   GFR calc non Af Amer 57 (*) >90 mL/min   GFR calc Af Amer 66 (*) >90 mL/min   Comment: (NOTE)     The eGFR has been calculated using the CKD EPI equation.     This calculation has not been validated in all clinical situations.     eGFR's persistently <90 mL/min signify possible Chronic Kidney     Disease.  POCT I-STAT TROPONIN I     Status: None   Collection Time    03/25/14  8:13 PM      Result Value Ref Range   Troponin i, poc 0.01  0.00 - 0.08 ng/mL   Comment 3            Comment: Due to the  release kinetics of cTnI,     a negative result within the first hours     of the onset of symptoms does not rule out     myocardial infarction with certainty.     If myocardial infarction is still suspected,     repeat the test at appropriate intervals.   No results found.  Review of Systems  Constitutional: Negative for fever, chills and weight loss.  HENT: Negative for hearing loss and tinnitus.   Eyes: Negative for blurred vision, photophobia and pain.  Respiratory: Positive for shortness of breath. Negative for hemoptysis and sputum production.   Cardiovascular: Positive for chest pain. Negative for palpitations and claudication.  Gastrointestinal: Negative for nausea, vomiting and abdominal pain.  Genitourinary: Negative for dysuria, urgency and frequency.  Musculoskeletal: Negative for back pain and neck pain.  Skin: Negative for rash.  Neurological: Positive for dizziness and loss of consciousness. Negative for tremors, speech change and headaches.  Endo/Heme/Allergies: Negative for polydipsia. Does not bruise/bleed easily.  Psychiatric/Behavioral: Negative for depression and substance abuse.    Blood pressure 100/68, pulse 55, temperature 96.6 F (35.9 C), temperature source Temporal, resp. rate 18, height '5\' 4"'  (1.626 m), weight 72.576 kg (160 lb), SpO2 98.00%. Physical Exam  Nursing note and vitals reviewed. Constitutional: She is oriented to person, place, and time. She appears well-developed and well-nourished. She appears distressed.  HENT:  Head: Normocephalic and atraumatic.  Nose: Nose normal.  Mouth/Throat: Oropharynx is clear and moist. No oropharyngeal exudate.  Eyes: Conjunctivae and EOM are normal. Pupils are equal, round, and reactive to light. No scleral icterus.  Neck: Normal range of motion. Neck supple. No JVD  present. No tracheal deviation present.  Cardiovascular: Normal rate, regular rhythm, normal heart sounds and intact distal pulses.  Exam reveals  no gallop.   No murmur heard. Respiratory: Effort normal and breath sounds normal. No respiratory distress. She has no wheezes. She has no rales.  GI: Soft. Bowel sounds are normal. She exhibits no distension. There is no tenderness. There is no rebound.  Musculoskeletal: Normal range of motion. She exhibits no edema and no tenderness.  Neurological: She is alert and oriented to person, place, and time. No cranial nerve deficit. Coordination normal.  Skin: No rash noted. She is diaphoretic. No erythema.  Psychiatric: She has a normal mood and affect. Her behavior is normal. Thought content normal.   labs reviewed as above; cr 0.98, K 3.4, wbc 7.9, plt 140s ECG: inferior STEMI  Problem List Inferior STEMI Hypertension Hypokalemia Dyslipidemia  Assessment/Plan 73 yo woman with PMH of hypertension here with inferior stemi s/p cardiac catheterization revealing 100% mid RCA s/p DES. Loaded with ticagrelor. Stable in CCU now.  - CCU, telemetry - trend cardiac markers x2 - aspirin 81 mg daily, ticagrelor 90 mg bid now - defer beta blocker given bradycardia on admission - begin cardiac rehab phase I - diet/education - tsh, bnp, lipid panel, hba1c   Jules Husbands 03/25/2014, 9:40 PM

## 2014-03-25 NOTE — ED Notes (Addendum)
Cath lab ready, to cath lab with RR RN and Dr. Katrinka Blazing.

## 2014-03-25 NOTE — ED Notes (Addendum)
Arrives on LSB, flat, on NRB and zoll pads, SB 34 PTA, now 55, 300cc NS bolusn given by EMS, ASA 324mg  given PTA, no ntg given d/t sBP 80s. Arrives to full STEMI team, Drs. Ayesha Rumpf (card) & card fellow. EMS reports syncope sb and low BP, chest & back pain.

## 2014-03-25 NOTE — ED Notes (Signed)
Patient taken to Cath lab at this time.

## 2014-03-25 NOTE — ED Provider Notes (Signed)
CSN: [id number]     Arrival date & time 03/25/14  1953 History   First MD Initiated Contact with Patient 03/25/14 2000     Chief Complaint: chest pain   (Consider location/radiation/quality/duration/timing/severity/associated sxs/prior Treatment) The history is provided by the patient.   history of present illness: 73 year old female who presents with chief complaint of chest pain. Onset of symptoms was earlier today. [place] After arriving home today she had onset of chest pain. Pain is substernal, described as pressure. Pain radiates to the back.. Was initially worse now rated 2/10. She also had associated shortness of breath.  Past Medical History  Diagnosis Date  . Hypertension   . Arthritis    Past Surgical History  Procedure Laterality Date  . Cataract extraction     History reviewed. No pertinent family history. History  Substance Use Topics  . Smoking status: Never Smoker   . Smokeless tobacco: Not on file  . Alcohol Use: No   OB History   Grav Para Term Preterm Abortions TAB SAB Ect Mult Living                 Review of Systems  Constitutional: Negative for fever and chills.  HENT: Negative for congestion.   Eyes: Negative for pain.  Respiratory: Positive for shortness of breath.   Cardiovascular: Positive for chest pain.  Gastrointestinal: Negative for nausea, vomiting, abdominal pain, diarrhea and constipation.  Genitourinary: Negative for dysuria.  Musculoskeletal: Negative for back pain.  Skin: Negative for rash and wound.  Neurological: Negative for headaches.  All other systems reviewed and are negative.     Allergies  Review of patient's allergies indicates no known allergies.  Home Medications   No current outpatient prescriptions on file. BP 136/70  Pulse 54  Temp(Src) 96.7 F (35.9 C) (Oral)  Resp 19  Ht 5\' 4"  (1.626 m)  Wt 164 lb 0.4 oz (74.4 kg)  BMI 28.14 kg/m2  SpO2  100% Physical Exam  Nursing note and vitals reviewed. Constitutional: She is oriented to person, place, and time. She appears well-developed and well-nourished. No distress.  HENT:  Head: Normocephalic and atraumatic.  Eyes: Conjunctivae are normal.  Neck: Neck supple.  Cardiovascular: Regular rhythm, normal heart sounds and intact distal pulses.  Bradycardia present.   Pulmonary/Chest: Effort normal and breath sounds normal. She has no wheezes. She has no rales.  Abdominal: Soft. She exhibits no distension. There is no tenderness.  Musculoskeletal: Normal range of motion. She exhibits no edema.  Neurological: She is alert and oriented to person, place, and time.  Skin: Skin is warm and dry.    ED Course  Procedures (including critical care time) Labs Review Labs Reviewed  CBC - Abnormal; Notable for the following:    HCT 35.8 (*)    MCV 77.0 (*)    MCH 25.8 (*)    Platelets 149 (*)    All other components within normal limits  DIFFERENTIAL - Abnormal; Notable for the following:    Neutrophils Relative % 32 (*)    Lymphocytes Relative 59 (*)    Lymphs Abs 4.6 (*)    All other components within normal limits  APTT - Abnormal; Notable for the following:    aPTT 53 (*)    All other components within normal limits  BASIC METABOLIC PANEL - Abnormal; Notable for the following:    Potassium 3.4 (*)    Glucose, Bld 136 (*)  GFR calc non Af Amer 57 (*)    GFR calc Af Amer 66 (*)    All other components within normal limits  MRSA PCR SCREENING  PROTIME-INR  CBC  BASIC METABOLIC PANEL  TSH  LIPID PANEL  PRO B NATRIURETIC PEPTIDE  TROPONIN I  TROPONIN I  LIPID PANEL  I-STAT TROPOININ, ED  POCT I-STAT TROPONIN I   Imaging Review Dg Chest Portable 1 View  03/25/2014   CLINICAL DATA:  Status post cardiac catheterization, no chest pain or shortness of breath, hypertension  EXAM: PORTABLE CHEST - 1 VIEW  COMPARISON:  CT ANGIO CHEST W/CM &/OR WO/CM dated 05/11/2011  FINDINGS:  The heart size and mediastinal contours are within normal limits. Both lungs are clear. The visualized skeletal structures are unremarkable.  IMPRESSION: No active disease.   Electronically Signed   By: Elige Ko   On: 03/25/2014 22:45     EKG Interpretation None      MDM   Final diagnoses:  None    73 year old female with history of hypertension who presented with several hours of substernal chest pain radiating to back and shortness of breath. Patient bradycardic with vital signs otherwise stable. EKG showed inferior ST elevation myocardial infarction. Cardiology present on patient's arrival to the emergency department. She was given aspirin and heparin in the ED. Patient taken directly to the Cath Lab with cardiology further management.   Date: 03/25/2014  Rate: 54  Rhythm: normal sinus rhythm  QRS Axis: normal  Intervals: normal  ST/T Wave abnormalities: ST elevations inferiorly and ST depressions laterally  Conduction Disutrbances:none  Narrative Interpretation: Inferior STEMI with lateral reciprocal changes  Old EKG Reviewed: none available    Cherre Robins, MD 03/25/14 2332

## 2014-03-25 NOTE — ED Notes (Addendum)
Heparin given

## 2014-03-25 NOTE — ED Notes (Addendum)
Care handed over to cath team, no changes, pt flat, supine, alert, NAD, calm, interactive, resps e/u, no dyspnea noted, SB 56 on monitor, rates pain "slight" 2/10 back, denies CP.

## 2014-03-26 DIAGNOSIS — I10 Essential (primary) hypertension: Secondary | ICD-10-CM

## 2014-03-26 DIAGNOSIS — I2119 ST elevation (STEMI) myocardial infarction involving other coronary artery of inferior wall: Secondary | ICD-10-CM

## 2014-03-26 DIAGNOSIS — M7750 Other enthesopathy of unspecified foot: Secondary | ICD-10-CM

## 2014-03-26 DIAGNOSIS — D649 Anemia, unspecified: Secondary | ICD-10-CM

## 2014-03-26 DIAGNOSIS — I219 Acute myocardial infarction, unspecified: Secondary | ICD-10-CM

## 2014-03-26 LAB — TROPONIN I
TROPONIN I: 16.39 ng/mL — AB (ref <0.30)
TROPONIN I: 19.26 ng/mL — AB (ref <0.30)

## 2014-03-26 LAB — LIPID PANEL
CHOLESTEROL: 167 mg/dL (ref 0–200)
HDL: 53 mg/dL (ref >39)
LDL CALC: 89 mg/dL (ref 0–99)
TRIGLYCERIDES: 124 mg/dL (ref <150)
Total CHOL/HDL Ratio: 3.2 RATIO
VLDL: 25 mg/dL (ref 0–40)

## 2014-03-26 LAB — BASIC METABOLIC PANEL
BUN: 10 mg/dL (ref 6–23)
CALCIUM: 9.4 mg/dL (ref 8.4–10.5)
CO2: 20 mEq/L (ref 19–32)
CREATININE: 0.6 mg/dL (ref 0.50–1.10)
Chloride: 104 mEq/L (ref 96–112)
GFR calc Af Amer: >90 mL/min (ref >90)
GFR calc non Af Amer: 90 mL/min — ABNORMAL LOW (ref >90)
Glucose, Bld: 110 mg/dL — ABNORMAL HIGH (ref 70–99)
Potassium: 4.3 mEq/L (ref 3.7–5.3)
Sodium: 140 mEq/L (ref 137–147)

## 2014-03-26 LAB — POCT ACTIVATED CLOTTING TIME: ACTIVATED CLOTTING TIME: 653 s

## 2014-03-26 LAB — PRO B NATRIURETIC PEPTIDE: Pro B Natriuretic peptide (BNP): 133.2 pg/mL — ABNORMAL HIGH (ref 0–125)

## 2014-03-26 LAB — TSH: TSH: 0.402 u[IU]/mL (ref 0.350–4.500)

## 2014-03-26 MED ORDER — METOPROLOL TARTRATE 12.5 MG HALF TABLET
12.5000 mg | ORAL_TABLET | Freq: Two times a day (BID) | ORAL | Status: DC
Start: 2014-03-26 — End: 2014-03-27
  Administered 2014-03-26 – 2014-03-27 (×3): 12.5 mg via ORAL
  Filled 2014-03-26 (×4): qty 1

## 2014-03-26 MED ORDER — ISOSORBIDE MONONITRATE 15 MG HALF TABLET
15.0000 mg | ORAL_TABLET | Freq: Every day | ORAL | Status: DC
  Administered 2014-03-26 – 2014-03-27 (×2): 15 mg via ORAL
  Filled 2014-03-26 (×2): qty 1

## 2014-03-26 MED FILL — Sodium Chloride IV Soln 0.9%: INTRAVENOUS | Qty: 50 | Status: AC

## 2014-03-26 NOTE — Care Management Note (Signed)
    Page 1 of 1   03/26/2014     10:09:05 AM   CARE MANAGEMENT NOTE 03/26/2014  Patient:  [name], [name]   Account Number:  [id number]  Date Initiated:  03/26/2014  Documentation initiated by:  Junius Creamer  Subjective/Objective Assessment:   adm w mi     Action/Plan:   lives w husband, pcp dr Julieanne Manson   Anticipated DC Date:     Anticipated DC Plan:        DC Planning Services  CM consult  Medication Assistance      Choice offered to / List presented to:             Status of service:   Medicare Important Message given?   (If response is "NO", the following Medicare IM given date fields will be blank) Date Medicare IM given:   Date Additional Medicare IM given:    Discharge Disposition:    Per UR Regulation:  Reviewed for med. necessity/level of care/duration of stay  If discussed at Long Length of Stay Meetings, dates discussed:    Comments:  4/13 1008 debbie Amica Harron rn,bsn spoke w pt. she states has ins for meds. gave her 30day free brilinta card.

## 2014-03-26 NOTE — Progress Notes (Signed)
Subjective:  Day 1 s/p Inferior STEMI; no recurrent chest pain.  Objective:   Vital Signs in the last 24 hours: Temp:  [96.6 F (35.9 C)-97.7 F (36.5 C)] 97.7 F (36.5 C) (04/13 0700) Pulse Rate:  [45-82] 58 (04/13 0700) Resp:  [11-24] 19 (04/13 0700) BP: (100-141)/(54-88) 136/70 mmHg (04/13 0700) SpO2:  [98 %-100 %] 100 % (04/13 0700) FiO2 (%):  [28 %] 28 % (04/12 2154) Weight:  [160 lb (72.576 kg)-164 lb 0.4 oz (74.4 kg)] 163 lb 5.8 oz (74.1 kg) (04/13 0500)  Intake/Output from previous day: 04/12 0701 - 04/13 0700 In: 875 [P.O.:60; I.V.:815] Out: 1800 [Urine:1800]  Medications: . aspirin  81 mg Oral Daily  . atorvastatin  40 mg Oral q1800  . excerise for heart and health book   Does not apply Once  . heparin  5,000 Units Subcutaneous 3 times per day  . isosorbide mononitrate  15 mg Oral Daily  . latanoprost  1 drop Both Eyes QHS  . metoprolol tartrate  12.5 mg Oral BID  . Ticagrelor  90 mg Oral BID       Physical Exam:   General appearance: alert, cooperative and no distress Neck: no adenopathy, no carotid bruit, no JVD, supple, symmetrical, trachea midline and thyroid not enlarged, symmetric, no tenderness/mass/nodules Lungs: clear to auscultation bilaterally Heart: regular rate and rhythm and 1/6 sem; no s3 or s4 Abdomen: soft, non-tender; bowel sounds normal; no masses,  no organomegaly Extremities: no edema, redness or tenderness in the calves or thighs Pulses: 2+ and symmetric; R radial cath site stable. Skin: Skin color, texture, turgor normal. No rashes or lesions Neurologic: Grossly normal   Rate: 70  Rhythm: normal sinus rhythm  ECG: SB at 59 with T inversion 3, avF  Lab Results:     Recent Labs  03/25/14 2008 03/26/14 0250  NA 143 140  K 3.4* 4.3  CL 103 104  CO2 25 20  GLUCOSE 136* 110*  BUN 12 10  CREATININE 0.98 0.60   CBC    Component Value Date/Time   WBC 7.9 03/25/2014 2008   RBC 4.65 03/25/2014 2008   HGB 12.0 03/25/2014  2008   HCT 35.8* 03/25/2014 2008   PLT 149* 03/25/2014 2008   MCV 77.0* 03/25/2014 2008   MCH 25.8* 03/25/2014 2008   MCHC 33.5 03/25/2014 2008   RDW 15.5 03/25/2014 2008   LYMPHSABS 4.6* 03/25/2014 2008   MONOABS 0.6 03/25/2014 2008   EOSABS 0.1 03/25/2014 2008   BASOSABS 0.0 03/25/2014 2008     Recent Labs  03/26/14 0055 03/26/14 0315  TROPONINI 19.26* 16.39*   Hepatic Function Panel No results found for this basename: PROT, ALBUMIN, AST, ALT, ALKPHOS, BILITOT, BILIDIR, IBILI,  in the last 72 hours  Recent Labs  03/25/14 2008  INR 1.22   BNP (last 3 results)  Recent Labs  03/26/14 0315  PROBNP 133.2*    Lipid Panel     Component Value Date/Time   CHOL 167 03/26/2014 0250   TRIG 124 03/26/2014 0250   HDL 53 03/26/2014 0250   CHOLHDL 3.2 03/26/2014 0250   VLDL 25 03/26/2014 0250   LDLCALC 89 03/26/2014 0250      Imaging:  Dg Chest Portable 1 View  03/25/2014   CLINICAL DATA:  Status post cardiac catheterization, no chest pain or shortness of breath, hypertension  EXAM: PORTABLE CHEST - 1 VIEW  COMPARISON:  CT ANGIO CHEST W/CM &/OR WO/CM dated 05/11/2011  FINDINGS: The heart size  and mediastinal contours are within normal limits. Both lungs are clear. The visualized skeletal structures are unremarkable.  IMPRESSION: No active disease.   Electronically Signed   By: Elige Ko   On: 03/25/2014 22:45      Assessment/Plan:   Principal Problem:   ST elevation myocardial infarction (STEMI) of inferior wall Active Problems:   Hypertension   Anemia   Pt is [place] from [place]. Day 1 s/p inferior STEMI with DES stenting to RCA with concomitant CAD of Ramus Intermediate for which medical therapy is recommended. Will add lopressor 12.5 mg bid today and low dose nitrate for concomitant disease. If BP stable then add low dose ACE-I. Try to ambulate later today and if stable probable transfer to telemetry. Microcytic indices; will check Fe  studies.   Lynn Bihari, MD, [facility] 03/26/2014, 9:31 AM

## 2014-03-26 NOTE — Progress Notes (Signed)
CARDIAC REHAB PHASE I   PRE:  Rate/Rhythm: 64 SR    BP: sitting 126/64    SaO2:   MODE:  Ambulation: 350 ft   POST:  Rate/Rhythm: 90 SR    BP: sitting 148/49     SaO2:   Tolerated well. Sts she has slight right knee pain from arthritis. Began discussing MI education with daughter present. Will f/u tomorrow. [id number]   Megan Salon CES, ACSM 03/26/2014 3:05 PM

## 2014-03-27 ENCOUNTER — Other Ambulatory Visit: Payer: Self-pay

## 2014-03-27 ENCOUNTER — Encounter (HOSPITAL_COMMUNITY): Payer: Self-pay | Admitting: Physician Assistant

## 2014-03-27 LAB — RETICULOCYTES
RBC.: 4.93 MIL/uL (ref 3.87–5.11)
RETIC COUNT ABSOLUTE: 49.3 10*3/uL (ref 19.0–186.0)
RETIC CT PCT: 1 % (ref 0.4–3.1)

## 2014-03-27 LAB — FOLATE: Folate: >20 ng/mL

## 2014-03-27 LAB — VITAMIN B12: VITAMIN B 12: 594 pg/mL (ref 211–911)

## 2014-03-27 LAB — FERRITIN: Ferritin: 91 ng/mL (ref 10–291)

## 2014-03-27 MED ORDER — ATORVASTATIN CALCIUM 40 MG PO TABS: 40.0000 mg | ORAL_TABLET | Freq: Every day | ORAL | Status: DC

## 2014-03-27 MED ORDER — TICAGRELOR 90 MG PO TABS: 90.0000 mg | ORAL_TABLET | Freq: Two times a day (BID) | ORAL | Status: DC

## 2014-03-27 MED ORDER — NITROGLYCERIN 0.4 MG SL SUBL: 0.4000 mg | SUBLINGUAL_TABLET | SUBLINGUAL | Status: DC | PRN

## 2014-03-27 MED ORDER — ASPIRIN 81 MG PO TABS: 81.0000 mg | ORAL_TABLET | Freq: Every day | ORAL | Status: DC

## 2014-03-27 MED ORDER — ISOSORBIDE MONONITRATE 15 MG HALF TABLET: 15.0000 mg | ORAL_TABLET | Freq: Every day | ORAL | Status: DC

## 2014-03-27 MED ORDER — METOPROLOL SUCCINATE ER 25 MG PO TB24: 25.0000 mg | ORAL_TABLET | Freq: Every day | ORAL | Status: DC

## 2014-03-27 NOTE — Progress Notes (Signed)
Patient ID: [name], female   DOB: Jan 27, 1941, 73 y.o.   MRN: [id number]   Subjective:  Day 2 s/p Inferior STEMI; no recurrent chest pain. Ambulating and had breakfast   Objective:   Vital Signs in the last 24 hours: Temp:  [98 F (36.7 C)-98.7 F (37.1 C)] 98 F (36.7 C) (04/14 0623) Pulse Rate:  [52-62] 52 (04/14 0623) Resp:  [18] 18 (04/14 0623) BP: (122-133)/(53-69) 122/69 mmHg (04/14 0623) SpO2:  [98 %-100 %] 100 % (04/14 0623) Weight:  [163 lb 9.3 oz (74.2 kg)] 163 lb 9.3 oz (74.2 kg) (04/14 0623)  Intake/Output from previous day: 04/13 0701 - 04/14 0700 In: 960 [P.O.:960] Out: -   Medications: . aspirin  81 mg Oral Daily  . atorvastatin  40 mg Oral q1800  . excerise for heart and health book   Does not apply Once  . heparin  5,000 Units Subcutaneous 3 times per day  . isosorbide mononitrate  15 mg Oral Daily  . latanoprost  1 drop Both Eyes QHS  . metoprolol tartrate  12.5 mg Oral BID  . Ticagrelor  90 mg Oral BID       Physical Exam:   General appearance: alert, cooperative and no distress Neck: no adenopathy, no carotid bruit, no JVD, supple, symmetrical, trachea midline and thyroid not enlarged, symmetric, no tenderness/mass/nodules Lungs: clear to auscultation bilaterally Heart: regular rate and rhythm and 1/6 sem; no s3 or s4 Abdomen: soft, non-tender; bowel sounds normal; no masses,  no organomegaly Extremities: no edema, redness or tenderness in the calves or thighs Pulses: 2+ and symmetric; R radial cath site stable. Skin: Skin color, texture, turgor normal. No rashes or lesions Neurologic: Grossly normal   Rate: 70  Rhythm: normal sinus rhythm  ECG: SB at 59 with T inversion 3, avF  Lab Results:     Recent Labs  03/25/14 2008 03/26/14 0250  NA 143 140  K 3.4* 4.3  CL 103 104  CO2 25 20  GLUCOSE 136* 110*  BUN 12 10  CREATININE 0.98 0.60   CBC    Component Value Date/Time   WBC 7.9 03/25/2014 2008   RBC 4.93 03/27/2014 0601   RBC 4.65 03/25/2014 2008   HGB 12.0 03/25/2014 2008   HCT 35.8* 03/25/2014 2008   PLT 149* 03/25/2014 2008   MCV 77.0* 03/25/2014 2008   MCH 25.8* 03/25/2014 2008   MCHC 33.5 03/25/2014 2008   RDW 15.5 03/25/2014 2008   LYMPHSABS 4.6* 03/25/2014 2008   MONOABS 0.6 03/25/2014 2008   EOSABS 0.1 03/25/2014 2008   BASOSABS 0.0 03/25/2014 2008     Recent Labs  03/26/14 0055 03/26/14 0315  TROPONINI 19.26* 16.39*    Recent Labs  03/25/14 2008  INR 1.22   BNP (last 3 results)  Recent Labs  03/26/14 0315  PROBNP 133.2*    Lipid Panel     Component Value Date/Time   CHOL 167 03/26/2014 0250   TRIG 124 03/26/2014 0250   HDL 53 03/26/2014 0250   CHOLHDL 3.2 03/26/2014 0250   VLDL 25 03/26/2014 0250   LDLCALC 89 03/26/2014 0250      Imaging:  Dg Chest Portable 1 View  03/25/2014   CLINICAL DATA:  Status post cardiac catheterization, no chest pain or shortness of breath, hypertension  EXAM: PORTABLE CHEST - 1 VIEW  COMPARISON:  CT ANGIO CHEST W/CM &/OR WO/CM dated 05/11/2011  FINDINGS: The heart size and mediastinal contours are within normal limits. Both lungs are  clear. The visualized skeletal structures are unremarkable.  IMPRESSION: No active disease.   Electronically Signed   By: Elige Ko   On: 03/25/2014 22:45      Assessment/Plan:   Principal Problem:   ST elevation myocardial infarction (STEMI) of inferior wall Active Problems:   Hypertension   Anemia   Pt is originally from [place]. Day 2 s/p inferior STEMI with DES stenting to RCA with concomitant CAD of Ramus Intermediate for which medical therapy is recommended.  Tolerating Beta blocker and nitrates with moderate IM disease.  D/C home today Outpatient f/u anemia with primary Abruve.  F/U Dr Katrinka Blazing next available  DAT for a year  Lennette Bihari, MD, [facility] 03/27/2014, 8:35 AM

## 2014-03-27 NOTE — Discharge Instructions (Signed)
PLEASE REMEMBER TO BRING ALL OF YOUR MEDICATIONS TO EACH OF YOUR FOLLOW-UP OFFICE VISITS. ° °PLEASE ATTEND ALL SCHEDULED FOLLOW-UP APPOINTMENTS.  ° °Activity: Increase activity slowly as tolerated. You may shower, but no soaking baths (or swimming) for 1 week. No driving for 1 week. No lifting over 5 lbs for 2 weeks. No sexual activity for 1 week.  ° °You May Return to Work: in 3 weeks (if applicable) ° °Wound Care: You may wash cath site gently with soap and water. Keep cath site clean and dry. If you notice pain, swelling, bleeding or pus at your cath site, please call [phone]. ° ° ° °Cardiac Cath Site Care °Refer to this sheet in the next few weeks. These instructions provide you with information on caring for yourself after your procedure. Your caregiver may also give you more specific instructions. Your treatment has been planned according to current medical practices, but problems sometimes occur. Call your caregiver if you have any problems or questions after your procedure. °HOME CARE INSTRUCTIONS °· You may shower 24 hours after the procedure. Remove the bandage (dressing) and gently wash the site with plain soap and water. Gently pat the site dry.  °· Do not apply powder or lotion to the site.  °· Do not sit in a bathtub, swimming pool, or whirlpool for 5 to 7 days.  °· No bending, squatting, or lifting anything over 10 pounds (4.5 kg) as directed by your caregiver.  °· Inspect the site at least twice daily.  °· Do not drive home if you are discharged the same day of the procedure. Have someone else drive you.  °· You may drive 24 hours after the procedure unless otherwise instructed by your caregiver.  °What to expect: °· Any bruising will usually fade within 1 to 2 weeks.  °· Blood that collects in the tissue (hematoma) may be painful to the touch. It should usually decrease in size and tenderness within 1 to 2 weeks.  °SEEK IMMEDIATE MEDICAL CARE IF: °· You have unusual pain at the site or down the  affected limb.  °· You have redness, warmth, swelling, or pain at the site.  °· You have drainage (other than a small amount of blood on the dressing).  °· You have chills.  °· You have a fever or persistent symptoms for more than 72 hours.  °· You have a fever and your symptoms suddenly get worse.  °· Your leg becomes pale, cool, tingly, or numb.  °· You have heavy bleeding from the site. Hold pressure on the site.  °Document Released: 01/02/2011 Document Revised: 11/19/2011 Document Reviewed:  ° °

## 2014-03-27 NOTE — Clinical Documentation Improvement (Signed)
Presents with MI, Cath'd and stented x2. Anemia documented in progress notes.  Please clarify/specify the type of anemia you feel the patient has to enhance the Severity of Illness and Risk of Mortality scores.    Acute Blood Loss Anemia Acute on chronic blood loss anemia Chronic Blood Loss Anemia Fe Deficiency Anemia Other Condition     Thank You, Shellee Milo ,RN Clinical Documentation Specialist:  [phone]  [facility]- Health Information Management

## 2014-03-27 NOTE — Discharge Summary (Signed)
CARDIOLOGY DISCHARGE SUMMARY   Patient ID: [name] MRN: [id number] DOB/AGE: 1941-09-30 73 y.o.  Admit date: 03/25/2014 Discharge date: 03/27/2014  PCP: Julieanne Manson, MD Primary Cardiologist: New will be Dr. Katrinka Blazing  Primary Discharge Diagnosis:  ST elevation myocardial infarction (STEMI) of inferior wall - s/p 2.75 x 23 mm long Xience Alpine DES overlapped with 3.0 x 8 mm long DES to the RCA  Secondary Discharge Diagnosis:    Hypertension   Anemia - possible iron deficiency   Hyperglycemia   Hypokalemia  Procedures: 1. Left heart catheterization; 2. Coronary angiography; 3. Left ventriculography; 4. Drug-eluting stent implantation mid right coronary x2, overlapping   Hospital Course: [name] is a 73 y.o. female with no history of CAD. She had a brief syncopal episode and EMS was called. Her ECG was consistent with an inferior STEMI and she was taken directly to the Cath Lab.  Cardiac catheterization results below. She had 2 drug-eluting stents to the RCA, overlapping. She tolerated the procedure well. Her EF was 60%. Her ramus intermedius was diffusely diseased with an 80% ostial and mid 95% stenosis. This is a small vessel and medical therapy was recommended. She tolerated the procedure well.  She was on aspirin, a statin and a beta blocker was added. She also had nitrates added for the ramus intermedius disease. She was seen by cardiac rehabilitation and educated on MI restrictions, heart-healthy lifestyle changes and exercise guidelines. She is encouraged to follow up with cardiac rehabilitation as an outpatient.  She was noted to have a low MCV, and iron studies were drawn and are pending. Her blood pressure was tolerating the new medications. She is to hold the HCTZ and PRN Lasix she was taking prior to admission. She is to hold the Naprosyn she was taking for arthritis pain as well. She was having no bleeding problems on the aspirin and Brilinta.  On 4/14, she  was seen by Dr. Eden Emms and by cardiac rehabilitation. He reviewed all data. She was ambulating without chest pain or shortness of breath. No further inpatient workup is indicated and she is considered stable for discharge, to follow up as an outpatient.  Labs:   Lab Results  Component Value Date   WBC 7.9 03/25/2014   HGB 12.0 03/25/2014   HCT 35.8* 03/25/2014   MCV 77.0* 03/25/2014   PLT 149* 03/25/2014     Recent Labs Lab 03/26/14 0250  NA 140  K 4.3  CL 104  CO2 20  BUN 10  CREATININE 0.60  CALCIUM 9.4  GLUCOSE 110*    Recent Labs  03/26/14 0055 03/26/14 0315  TROPONINI 19.26* 16.39*   Lipid Panel     Component Value Date/Time   CHOL 167 03/26/2014 0250   TRIG 124 03/26/2014 0250   HDL 53 03/26/2014 0250   CHOLHDL 3.2 03/26/2014 0250   VLDL 25 03/26/2014 0250   LDLCALC 89 03/26/2014 0250    Pro B Natriuretic peptide (BNP)  Date/Time Value Ref Range Status  03/26/2014  3:15 AM 133.2* 0 - 125 pg/mL Final    Recent Labs  03/25/14 2008  INR 1.22   No results found for this basename: IRON,  TIBC,  FERRITIN      Radiology: Dg Chest Portable 1 View 03/25/2014   CLINICAL DATA:  Status post cardiac catheterization, no chest pain or shortness of breath, hypertension  EXAM: PORTABLE CHEST - 1 VIEW  COMPARISON:  CT ANGIO CHEST W/CM &/OR WO/CM dated 05/11/2011  FINDINGS: The heart  size and mediastinal contours are within normal limits. Both lungs are clear. The visualized skeletal structures are unremarkable.  IMPRESSION: No active disease.   Electronically Signed   By: Elige Ko   On: 03/25/2014 22:45    Cardiac Cath: 03/25/2014 ANGIOGRAPHIC DATA: The left main coronary artery is widely patent.  The left anterior descending artery is widely patent and transapical. There is proximal to mid irregularities but no high-grade obstruction is noted..  The left circumflex artery is widely patent. 3 obtuse of free of any significant obstruction.  The ramus intermedius is diffusely  diseased and contains a 70 to 80% ostial/proximal stenosis and a mid 95% stenosis. Likely this vessel is relatively small in vascular territory supplied.  The right coronary artery is 100% occluded in the mid vessel.  PCI RESULTS: The right coronary was 100% occluded in the midsegment and following PCI with overlapping stent implantation and post dilatation to 3.0 mm in diameter, 0% stenosis was noted to TIMI grade 3 flow was reestablished.  LEFT VENTRICULOGRAM: Left ventricular angiogram was done in the 30 RAO projection and revealed minimal inferobasal hypokinesis. EF 60%.  IMPRESSIONS: 1. Acute inferior ST elevation microinfarction related to acute occlusion of the mid right coronary  2. Successful drug-eluting stent implantation in the mid right coronary, overlapping and post dilated to 3.0 mm in diameter.  3. Severe ramus intermedius disease. This vessel is relatively small in distribution and can easily be treated with medical therapy. There is irregularity in the mid LAD but no significant obstruction is noted in the LAD or other vascular territories not already mentioned.  4. Overall normal LV function with inferobasal hypokinesis. EF 60%  RECOMMENDATION:  1. Aspirin and Brilinta  2. Statin therapy  3. Consider beta blocker therapy within the next 12-24 hours depending upon heart rate and blood pressure  4.Candidate for discharge in 48-72 hours  EKG: 27-Mar-2014 05:17:53   Sinus bradycardia with Premature supraventricular complexes Minimal voltage criteria for LVH, may be normal variant Inferior infarct  Vent. rate 54 BPM PR interval 124 ms QRS duration 100 ms QT/QTc 474/449 ms P-R-T axes 74 -4 -28  FOLLOW UP PLANS AND APPOINTMENTS No Known Allergies   Medication List    STOP taking these medications       furosemide 40 MG tablet  Commonly known as:  LASIX     hydrochlorothiazide 12.5 MG capsule  Commonly known as:  MICROZIDE     naproxen 500 MG EC tablet  Commonly  known as:  EC NAPROSYN      TAKE these medications       aspirin 81 MG tablet  Take 1 tablet (81 mg total) by mouth daily.     atorvastatin 40 MG tablet  Commonly known as:  LIPITOR  Take 1 tablet (40 mg total) by mouth daily.     isosorbide mononitrate 15 mg Tb24 24 hr tablet  Commonly known as:  IMDUR  Take 0.5 tablets (15 mg total) by mouth daily.     latanoprost 0.005 % ophthalmic solution  Commonly known as:  XALATAN  Place 1 drop into both eyes at bedtime.     metoprolol succinate 25 MG 24 hr tablet  Commonly known as:  TOPROL XL  Take 1 tablet (25 mg total) by mouth daily.     nitroGLYCERIN 0.4 MG SL tablet  Commonly known as:  NITROSTAT  Place 1 tablet (0.4 mg total) under the tongue every 5 (five) minutes as needed for chest pain.  Ticagrelor 90 MG Tabs tablet  Commonly known as:  BRILINTA  Take 1 tablet (90 mg total) by mouth 2 (two) times daily.        Discharge Orders   Future Appointments Provider Department Dept Phone   04/11/2014 11:10 AM Beatrice LecherScott T Weaver, PA-C [facility] [address] [facility] [phone]   Future Orders Complete By Expires   Amb Referral to Cardiac Rehabilitation  As directed    Diet - low sodium heart healthy  As directed    Increase activity slowly  As directed      Follow-up Information   Follow up with Tereso NewcomerScott Weaver, PA-C On 04/11/2014. (See for Dr. Katrinka BlazingSmith at 11:10 am.)    Specialty:  Physician Assistant   Contact information:   [address] [place] [address] [phone]       BRING ALL MEDICATIONS WITH YOU TO FOLLOW UP APPOINTMENTS  Time spent with patient to include physician time: 41 min Signed: Darrol JumpRhonda G Divit Stipp, PA-C 03/27/2014, 11:40 AM Co-Sign MD

## 2014-03-27 NOTE — Progress Notes (Signed)
[id number] Cardiac Rehab Pt has been up ambulating denies any cp or SOB. Completed MI and stent education with pt. She voices understanding. Pt agrees to Outpt. CRP in [place], will send referral. Beatrix Fetters, RN 03/27/2014 10:25 AM

## 2014-03-27 NOTE — Progress Notes (Signed)
1128 03-27-14 Pt has brilinta discount card. CM did call [facility] and medication is available. Cost will be $3.60. No further needs from CM at this time. Lamar Laundry [place] , RN,BSN [phone]

## 2014-03-28 LAB — IRON AND TIBC
Iron: 102 ug/dL (ref 42–135)
Saturation Ratios: 31 % (ref 20–55)
TIBC: 324 ug/dL (ref 250–470)
UIBC: 222 ug/dL (ref 125–400)

## 2014-04-10 ENCOUNTER — Encounter: Payer: Self-pay | Admitting: *Deleted

## 2014-04-11 ENCOUNTER — Ambulatory Visit (INDEPENDENT_AMBULATORY_CARE_PROVIDER_SITE_OTHER): Payer: Medicare Other | Admitting: Physician Assistant

## 2014-04-11 ENCOUNTER — Encounter: Payer: Self-pay | Admitting: Physician Assistant

## 2014-04-11 VITALS — BP 130/78 | HR 67 | Ht 64.0 in | Wt 155.0 lb

## 2014-04-11 DIAGNOSIS — D509 Iron deficiency anemia, unspecified: Secondary | ICD-10-CM

## 2014-04-11 DIAGNOSIS — I252 Old myocardial infarction: Secondary | ICD-10-CM

## 2014-04-11 DIAGNOSIS — I209 Angina pectoris, unspecified: Secondary | ICD-10-CM

## 2014-04-11 DIAGNOSIS — I2510 Atherosclerotic heart disease of native coronary artery without angina pectoris: Secondary | ICD-10-CM

## 2014-04-11 DIAGNOSIS — E785 Hyperlipidemia, unspecified: Secondary | ICD-10-CM

## 2014-04-11 DIAGNOSIS — I208 Other forms of angina pectoris: Secondary | ICD-10-CM

## 2014-04-11 MED ORDER — ISOSORBIDE MONONITRATE ER 30 MG PO TB24: 30.0000 mg | ORAL_TABLET | Freq: Every day | ORAL | Status: DC

## 2014-04-11 MED ORDER — METOPROLOL SUCCINATE ER 50 MG PO TB24: 50.0000 mg | ORAL_TABLET | Freq: Every day | ORAL | Status: DC

## 2014-04-11 NOTE — Progress Notes (Signed)
[address] [place], [place]  [address] Phone: [phone] Fax:  [phone]  Date:  04/11/2014   ID:  [name], DOB [birth year], MRN [id number]  PCP:  Lynn Manson, MD  Cardiologist:  Dr. Verdis Whitehead      History of Present Illness: [name] is a 73 y.o. female who was recently admitted 4/12-4/14 with an inferior STEMI. She presented with syncope and was noted to have inferior ST elevation on ECG by EMS. Emergent cardiac catheterization demonstrated diffuse disease in the ramus intermedius with small distribution and an occluded mid RCA. The RI is to be treated medically. The RCA occlusion was the culprit vessel and this was treated with overlapping DES. Nitrates were added for symptom prevention.  The patient was noted to have very mild microcytic anemia. Iron studies were normal. [place] and likely has a Psychologist, forensic.  She has been asked to followup with primary care.  She returns for follow up.  She is feeling much better.  She denies chest pain or shortness of breath.  She continues to have interscapular back pain with increased activity. She denies syncope.  No orthopnea, PND, edema.     Studies:  - LHC (03/25/14):  Ostial/prox RI 70-80%, mid RI 95%, mid RCA occluded (culprit).  EF 60% with inferobasal HK.  RI small in distribution and med Rx recommended.  PCI:  Overlapping Xience Alpine (2.5 x 12 mm and 3 x 8 mm) DES to mid RCA.    Recent Labs: 03/25/2014: Hemoglobin 12.0  03/26/2014: Creatinine 0.60; HDL Cholesterol 53; LDL (calc) 89; Potassium 4.3; Pro B Natriuretic peptide (BNP) 133.2*; TSH 0.402   Wt Readings from Last 3 Encounters:  04/11/14 155 lb (70.308 kg)  03/27/14 163 lb 9.3 oz (74.2 kg)  03/27/14 163 lb 9.3 oz (74.2 kg)     Past Medical History  Diagnosis Date  . Hypertension   . Arthritis   . ST elevation myocardial infarction (STEMI) of inferior wall 03/25/2014    DES x2 RCA    . Coronary artery disease     a. Inf  STEMI (03/2014) => LHC (03/25/14):  Ostial/prox RI 70-80%, mid RI 95%, mid RCA occluded (culprit).  EF 60% with inferobasal HK.  RI small in distribution and med Rx recommended.  PCI:  Overlapping Xience Alpine (2.5 x 12 mm and 3 x 8 mm) DES to mid RCA.    Marland Kitchen Hyperlipidemia   . Microcytic anemia     Current Outpatient Prescriptions  Medication Sig Dispense Refill  . aspirin 81 MG tablet Take 1 tablet (81 mg total) by mouth daily.      Marland Kitchen atorvastatin (LIPITOR) 40 MG tablet Take 1 tablet (40 mg total) by mouth daily.  30 tablet  11  . isosorbide mononitrate (IMDUR) 15 mg TB24 24 hr tablet Take 30 mg by mouth daily.      Marland Kitchen latanoprost (XALATAN) 0.005 % ophthalmic solution Place 1 drop into both eyes at bedtime.      . metoprolol succinate (TOPROL XL) 25 MG 24 hr tablet Take 1 tablet (25 mg total) by mouth daily.  30 tablet  11  . nitroGLYCERIN (NITROSTAT) 0.4 MG SL tablet Place 1 tablet (0.4 mg total) under the tongue every 5 (five) minutes as needed for chest pain.  25 tablet  3  . Ticagrelor (BRILINTA) 90 MG TABS tablet Take 1 tablet (90 mg total) by mouth 2 (two) times daily.  60 tablet  11  No current facility-administered medications for this visit.    Allergies:   Review of patient's allergies indicates no known allergies.   Social History:  The patient  reports that she has never smoked. She does not have any smokeless tobacco history on file. She reports that she does not drink alcohol or use illicit drugs.   Family History:  The patient's family history is not on file.   ROS:  Please see the history of present illness.      All other systems reviewed and negative.   PHYSICAL EXAM: VS:  BP 130/78  Pulse 67  Ht 5\' 4"  (1.626 m)  Wt 155 lb (70.308 kg)  BMI 26.59 kg/m2 Well nourished, well developed, in no acute distress HEENT: normal Neck: no JVD Cardiac:  normal S1, S2; RRR; no murmur Lungs:  clear to auscultation bilaterally, no wheezing, rhonchi or rales Abd: soft, nontender,  no hepatomegaly Ext: no edema Skin: warm and dryright wrist without hematoma or mass  Neuro:  CNs 2-12 intact, no focal abnormalities noted  EKG:  NSR, HR 67, iRBBB     ASSESSMENT AND PLAN:  1. History of ST elevation myocardial infarction (STEMI):  She is doing well after recent inf STEMI tx with DES x 2.  We discussed the importance of dual antiplatelet therapy. She will enroll in cardiac rehab soon.   2. Stable Exertional Angina:  She has some interscapular back pain with exertion at times.  I suspect this is her anginal equivalent.  She has residual disease in the RI that is to be treated medically.  She will remain on Isosorbide.  I will increase her Toprol to 50 mg QD.   3. Coronary artery disease:  As above.  Continue ASA, Brilinta, beta blocker, nitrates, statin.  4. Hyperlipidemia:  Continue statin.  Check Lipids and LFTs in 6 weeks.   5. 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  Fe studies were normal.  F/u with PCP as scheduled. 6. Disposition:  F/u with Dr. Verdis PrimeHenry Whitehead in 6-8 weeks.   Signed, Lynn NewcomerScott Tericka Devincenzi, PA-C  04/11/2014 11:22 AM

## 2014-04-11 NOTE — Patient Instructions (Addendum)
We sent in a new prescription for Isosorbide 30 mg take one tablet daily to [facility] on [address].  Increase your Toprol XL to 50 mg one tablet daily.  You can take 2 tablets of the 25 mg to equal 50 mg until you get the new prescription.  Your physician recommends that you schedule a follow-up appointment in:  6-8 weeks with Dr. Verdis Prime. Your physician recommends that you return for lab work in:  6 weeks (Lipids and LFTS).

## 2014-04-13 ENCOUNTER — Telehealth: Payer: Self-pay | Admitting: Interventional Cardiology

## 2014-04-13 NOTE — Telephone Encounter (Signed)
Pt called because she was seen by Tereso Newcomer PA on 04/11/14 PA recommended cardiac rehab. PA States in his O/V note:" she will be enroll in cardiac rehab soon" Pt would like to start rehab prior going to [facility] for the weekend on 5/23 rd. Pt is aware that this note will be send to Dr. Katrinka Blazing for recommendations.

## 2014-04-13 NOTE — Telephone Encounter (Signed)
New message          Pt would like to start rehab soon / pt has to travel to [facility] 5/23 and would like to get started before then.

## 2014-04-19 NOTE — Telephone Encounter (Signed)
See if we can arrange

## 2014-04-21 ENCOUNTER — Other Ambulatory Visit: Payer: Self-pay | Admitting: Physician Assistant

## 2014-04-25 NOTE — Telephone Encounter (Signed)
Follow up  ° ° °Patient returning call back to nurse  °

## 2014-04-25 NOTE — Telephone Encounter (Signed)
New problem   Pt want to know if she can cook with a pinch of salt, can she eat tomato paste and ranch on her salad.

## 2014-04-25 NOTE — Telephone Encounter (Signed)
Called wanting to know what foods were allowed for low sodium diet.  Reviewed foods.  She specifically wanted to know about tomato paste and ginger.  Advised her to look at labels to see Sodium content and be aware that she should limit Sodium intake to 1500 mg daily. She will be more aware of her intake.

## 2014-04-25 NOTE — Telephone Encounter (Signed)
Lm to call back

## 2014-05-01 ENCOUNTER — Telehealth: Payer: Self-pay | Admitting: Interventional Cardiology

## 2014-05-01 NOTE — Telephone Encounter (Signed)
returned pt call.pt sts that she has had a itchy rash that is all over body the last couple of weeks.pt sts thst initially she thought it was bug bites or bed bugs.pt sts that she has since gotten a new bed.pt sts that she now believes that it is an reaction to medication.pt has started new medication upon discharge from the hospital in April.pt sts that she does not know which one it might be.pt is currently taking all medication as prescribed.pt denies fever,chills,sob,swelling,chest pain, nausea,vomitting, or diarrhea. Adv pt I will discuss with Dr.Smith and call her back.pt verbalized understanding

## 2014-05-01 NOTE — Telephone Encounter (Signed)
New message    Sided effect from medication - rash . Patient stated she does not know which med's it is.

## 2014-05-02 MED ORDER — CLOPIDOGREL BISULFATE 75 MG PO TABS: 75.0000 mg | ORAL_TABLET | Freq: Every day | ORAL | Status: DC

## 2014-05-02 NOTE — Telephone Encounter (Signed)
called pt with Dr.Smith's instructions d/c brilinta start Plavix 75mg  daily. Rx sent to pt pharmacy.pt will pick it up and start this evening.pt has taken brilinta dose this morning.pt will keep appt with Dr.Smith 6/18 lab on 6/8. pt agreeable with plan and verbalized understanding.

## 2014-05-04 ENCOUNTER — Telehealth: Payer: Self-pay | Admitting: Interventional Cardiology

## 2014-05-04 NOTE — Telephone Encounter (Signed)
Patient called back, if she cant travel this weekend. Can Dr Katrinka Blazing order a caregiver for the weekend? Please call and advise.

## 2014-05-04 NOTE — Telephone Encounter (Signed)
New message     Rash is gone from taking brilinta.  Patient feels better.  Patient want to know if she can travel to [place]---she will be riding in a car leaving this afternoon and returning Sunday or Monday.

## 2014-05-04 NOTE — Telephone Encounter (Signed)
returned pt call.adv her per Dr.Smith ok for her to travel to [facility] if she feels up to it.pt verbalized understanding.

## 2014-05-15 ENCOUNTER — Encounter: Payer: Self-pay | Admitting: Interventional Cardiology

## 2014-05-21 ENCOUNTER — Other Ambulatory Visit: Payer: Medicare Other

## 2014-05-28 ENCOUNTER — Ambulatory Visit: Payer: Medicare Other | Admitting: Interventional Cardiology

## 2014-05-31 ENCOUNTER — Ambulatory Visit: Payer: Medicare Other | Admitting: Interventional Cardiology

## 2014-06-02 ENCOUNTER — Other Ambulatory Visit: Payer: Self-pay | Admitting: Cardiology

## 2014-06-13 ENCOUNTER — Other Ambulatory Visit (INDEPENDENT_AMBULATORY_CARE_PROVIDER_SITE_OTHER): Payer: Medicare Other

## 2014-06-13 DIAGNOSIS — I2510 Atherosclerotic heart disease of native coronary artery without angina pectoris: Secondary | ICD-10-CM

## 2014-06-13 DIAGNOSIS — E785 Hyperlipidemia, unspecified: Secondary | ICD-10-CM

## 2014-06-13 LAB — LIPID PANEL
Cholesterol: 137 mg/dL (ref 0–200)
HDL: 55.1 mg/dL (ref >39.00)
LDL Cholesterol: 65 mg/dL (ref 0–99)
NONHDL: 81.9
Total CHOL/HDL Ratio: 2
Triglycerides: 86 mg/dL (ref 0.0–149.0)
VLDL: 17.2 mg/dL (ref 0.0–40.0)

## 2014-06-13 LAB — HEPATIC FUNCTION PANEL
ALT: 19 U/L (ref 0–35)
AST: 23 U/L (ref 0–37)
Albumin: 4.4 g/dL (ref 3.5–5.2)
Alkaline Phosphatase: 64 U/L (ref 39–117)
BILIRUBIN DIRECT: 0 mg/dL (ref 0.0–0.3)
BILIRUBIN TOTAL: 0.5 mg/dL (ref 0.2–1.2)
Total Protein: 8 g/dL (ref 6.0–8.3)

## 2014-06-18 ENCOUNTER — Telehealth: Payer: Self-pay | Admitting: *Deleted

## 2014-06-18 NOTE — Telephone Encounter (Signed)
pt notified about lab results with verbal understanding. Pt then states has rash since starting imdur, so she has stopped for now and will d/w Dr. Katrinka Blazing 7/8.

## 2014-06-20 ENCOUNTER — Encounter: Payer: Self-pay | Admitting: Interventional Cardiology

## 2014-06-20 ENCOUNTER — Encounter (INDEPENDENT_AMBULATORY_CARE_PROVIDER_SITE_OTHER): Payer: Self-pay

## 2014-06-20 ENCOUNTER — Ambulatory Visit (INDEPENDENT_AMBULATORY_CARE_PROVIDER_SITE_OTHER): Payer: Medicare Other | Admitting: Interventional Cardiology

## 2014-06-20 VITALS — BP 122/64 | HR 65 | Ht 64.0 in | Wt 153.0 lb

## 2014-06-20 DIAGNOSIS — I252 Old myocardial infarction: Secondary | ICD-10-CM

## 2014-06-20 DIAGNOSIS — I10 Essential (primary) hypertension: Secondary | ICD-10-CM

## 2014-06-20 DIAGNOSIS — I2510 Atherosclerotic heart disease of native coronary artery without angina pectoris: Secondary | ICD-10-CM

## 2014-06-20 DIAGNOSIS — E785 Hyperlipidemia, unspecified: Secondary | ICD-10-CM

## 2014-06-20 NOTE — Patient Instructions (Signed)
Your physician recommends that you continue on your current medications as directed. Please refer to the Current Medication list given to you today.  Try to stay active  Your physician wants you to follow-up in: 3-4 momths You will receive a reminder letter in the mail two months in advance. If you don't receive a letter, please call our office to schedule the follow-up appointment.

## 2014-06-20 NOTE — Progress Notes (Signed)
Patient ID: Lynn CanadaFenella Whitehead, female   DOB: 1941/02/28, 73 y.o.   MRN: [id number]    [address] [place], [place]  [address] Phone: [phone] Fax:  [phone]  Date:  06/20/2014   ID:  [name], DOB 1941/02/28, MRN [id number]  PCP:  Julieanne MansonMULBERRY,ELIZABETH, MD   ASSESSMENT:  1. Hypertension 2. Coronary artery disease with RCA drug-eluting stent in April 2015 3. Hyperlipidemia with recent LDL of 65 on Lipitor 40 mg daily  PLAN:  1.Continue current medical regimen as listed 2. I recommended cardiac rehabilitation but she refused 3. Clinical followup in 3-4 months 4. Continue medications as listed   SUBJECTIVE: [name] is a 73 y.o. female who is currently doing well. No symptoms of angina since RCA stenting on acute inferior MI in April. She had rash associated with Brilinta and is now on clopidogrel. Itching occurred and she discontinue isosorbide 3 weeks ago. No subsequent itching has occurred. She denies orthopnea, PND, and syncope. Overall she is happy with her current exertional tolerance and lifestyle.   Wt Readings from Last 3 Encounters:  06/20/14 153 lb (69.4 kg)  04/11/14 155 lb (70.308 kg)  03/27/14 163 lb 9.3 oz (74.2 kg)     Past Medical History  Diagnosis Date  . Hypertension   . Arthritis   . ST elevation myocardial infarction (STEMI) of inferior wall 03/25/2014    DES x2 RCA    . Coronary artery disease     a. Inf STEMI (03/2014) => LHC (03/25/14):  Ostial/prox RI 70-80%, mid RI 95%, mid RCA occluded (culprit).  EF 60% with inferobasal HK.  RI small in distribution and med Rx recommended.  PCI:  Overlapping Xience Alpine (2.5 x 12 mm and 3 x 8 mm) DES to mid RCA.    Marland Kitchen. Hyperlipidemia   . Microcytic anemia     Current Outpatient Prescriptions  Medication Sig Dispense Refill  . ASPIRIN LOW DOSE 81 MG EC tablet TAKE 1 TABLET BY MOUTH EVERY DAY  30 tablet  2  . atorvastatin (LIPITOR) 40 MG tablet Take 1 tablet (40 mg total) by mouth daily.  30  tablet  11  . clopidogrel (PLAVIX) 75 MG tablet Take 1 tablet (75 mg total) by mouth daily.  30 tablet  11  . hydrochlorothiazide (HYDRODIURIL) 25 MG tablet Take 25 mg by mouth daily.      . hydrOXYzine (ATARAX/VISTARIL) 25 MG tablet Take 25 mg by mouth as needed.      . latanoprost (XALATAN) 0.005 % ophthalmic solution Place 1 drop into both eyes at bedtime.      . metoprolol succinate (TOPROL XL) 50 MG 24 hr tablet Take 1 tablet (50 mg total) by mouth daily.  30 tablet  11  . nitroGLYCERIN (NITROSTAT) 0.4 MG SL tablet Place 1 tablet (0.4 mg total) under the tongue every 5 (five) minutes as needed for chest pain.  25 tablet  3   No current facility-administered medications for this visit.    Allergies:    Allergies  Allergen Reactions  . Imdur [Isosorbide Dinitrate] Itching    Social History:  The patient  reports that she has never smoked. She does not have any smokeless tobacco history on file. She reports that she does not drink alcohol or use illicit drugs.   ROS:  Please see the history of present illness.   No neurological complaints. Rash is resolved. Itching is resolved. She has not had syncope. No medication side effects.  All other systems reviewed and negative.   OBJECTIVE: VS:  BP 122/64  Pulse 65  Ht 5\' 4"  (1.626 m)  Wt 153 lb (69.4 kg)  BMI 26.25 kg/m2 Well nourished, well developed, in no acute distress, young-appearing  HEENT: normal Neck: JVD flat. Carotid bruit absent  Cardiac:  normal S1, S2; RRR; no murmur Lungs:  clear to auscultation bilaterally, no wheezing, rhonchi or rales Abd: soft, nontender, no hepatomegaly Ext: Edema absent. Pulses 2+ Skin: warm and dry Neuro:  CNs 2-12 intact, no focal abnormalities noted  EKG:  Not  performed     Signed, Darci Needle III, MD 06/20/2014 10:12 AM

## 2014-07-02 ENCOUNTER — Other Ambulatory Visit: Payer: Self-pay | Admitting: Cardiology

## 2014-08-08 ENCOUNTER — Other Ambulatory Visit: Payer: Self-pay | Admitting: Interventional Cardiology

## 2014-08-13 ENCOUNTER — Ambulatory Visit (INDEPENDENT_AMBULATORY_CARE_PROVIDER_SITE_OTHER): Payer: Medicare Other | Admitting: Physician Assistant

## 2014-08-13 ENCOUNTER — Encounter: Payer: Self-pay | Admitting: Physician Assistant

## 2014-08-13 VITALS — BP 120/62 | HR 85 | Ht 64.0 in | Wt 152.0 lb

## 2014-08-13 DIAGNOSIS — I10 Essential (primary) hypertension: Secondary | ICD-10-CM

## 2014-08-13 DIAGNOSIS — K029 Dental caries, unspecified: Secondary | ICD-10-CM

## 2014-08-13 DIAGNOSIS — I2510 Atherosclerotic heart disease of native coronary artery without angina pectoris: Secondary | ICD-10-CM

## 2014-08-13 DIAGNOSIS — K5900 Constipation, unspecified: Secondary | ICD-10-CM | POA: Insufficient documentation

## 2014-08-13 DIAGNOSIS — I252 Old myocardial infarction: Secondary | ICD-10-CM

## 2014-08-13 MED ORDER — CLOPIDOGREL BISULFATE 75 MG PO TABS: 75.0000 mg | ORAL_TABLET | Freq: Every day | ORAL | Status: DC

## 2014-08-13 MED ORDER — HYDROCHLOROTHIAZIDE 25 MG PO TABS: 25.0000 mg | ORAL_TABLET | Freq: Every day | ORAL | Status: DC

## 2014-08-13 MED ORDER — ASPIRIN 81 MG PO TBEC: 81.0000 mg | DELAYED_RELEASE_TABLET | Freq: Every day | ORAL | Status: DC

## 2014-08-13 MED ORDER — METOPROLOL SUCCINATE ER 50 MG PO TB24: 50.0000 mg | ORAL_TABLET | Freq: Every day | ORAL | Status: DC

## 2014-08-13 MED ORDER — ATORVASTATIN CALCIUM 40 MG PO TABS: 40.0000 mg | ORAL_TABLET | Freq: Every day | ORAL | Status: DC

## 2014-08-13 NOTE — Patient Instructions (Signed)
Your physician recommends that you continue on your current medications as directed. Please refer to the Current Medication list given to you today.   CAN TAKE METAMUCIL FOR  CONSTIPATION

## 2014-08-13 NOTE — Assessment & Plan Note (Signed)
Stable without chest pain. Continue Plavix and aspirin. I told her she can see her dentist and they can pull her tooth they can do it without stopping Plavix. Do not stop Plavix for at least 6 months after her MI and stenting, and consult with Dr. Katrinka Blazing prior to stopping Plavix.

## 2014-08-13 NOTE — Assessment & Plan Note (Signed)
See above

## 2014-08-13 NOTE — Assessment & Plan Note (Signed)
May use Metamucil for constipation.

## 2014-08-13 NOTE — Assessment & Plan Note (Signed)
Controlled.  

## 2014-08-13 NOTE — Progress Notes (Signed)
HPI: This is a very pleasant 73 year old female patient of Dr. Garnette Scheuermann who has history of inferior wall STEMI treated with overlapping drug-eluting stents to the RCA in April 2015. She had diffuse disease in the ramus intermedius with small distribution that was treated medically. EF was 60% with inferior basal hypokinesis. She had a rash associated with Brilinta and is now on Plavix.  She recently traveled to [place] and was driving home and stopped to eat at 1 AM. She then vomited and EMS was called and wanted to take her to the hospital. She comes in today just to have a checkup. She denied any chest pain, palpitations, dyspnea, dyspnea on exertion, dizziness, or presyncope. She exercises daily and denies any cardiac symptoms. She feels quite well. She also needs medication refills today. She mentioned that she needs to see her dentist for her loose tooth.  Allergies  Allergen Reactions  . Imdur [Isosorbide Dinitrate] Itching     Current Outpatient Prescriptions  Medication Sig Dispense Refill  . ASPIRIN LOW DOSE 81 MG EC tablet TAKE 1 TABLET BY MOUTH EVERY DAY  30 tablet  2  . atorvastatin (LIPITOR) 40 MG tablet Take 1 tablet (40 mg total) by mouth daily.  30 tablet  11  . clopidogrel (PLAVIX) 75 MG tablet Take 1 tablet (75 mg total) by mouth daily.  30 tablet  11  . hydrochlorothiazide (HYDRODIURIL) 25 MG tablet Take 25 mg by mouth daily.      . hydrOXYzine (ATARAX/VISTARIL) 25 MG tablet Take 25 mg by mouth as needed.      . latanoprost (XALATAN) 0.005 % ophthalmic solution Place 1 drop into both eyes at bedtime.      . metoprolol succinate (TOPROL XL) 50 MG 24 hr tablet Take 1 tablet (50 mg total) by mouth daily.  30 tablet  11  . nitroGLYCERIN (NITROSTAT) 0.4 MG SL tablet Place 1 tablet (0.4 mg total) under the tongue every 5 (five) minutes as needed for chest pain.  25 tablet  3   No current facility-administered medications for this visit.    Past Medical  History  Diagnosis Date  . Hypertension   . Arthritis   . ST elevation myocardial infarction (STEMI) of inferior wall 03/25/2014    DES x2 RCA    . Coronary artery disease     a. Inf STEMI (03/2014) => LHC (03/25/14):  Ostial/prox RI 70-80%, mid RI 95%, mid RCA occluded (culprit).  EF 60% with inferobasal HK.  RI small in distribution and med Rx recommended.  PCI:  Overlapping Xience Alpine (2.5 x 12 mm and 3 x 8 mm) DES to mid RCA.    Marland Kitchen Hyperlipidemia   . Microcytic anemia     Past Surgical History  Procedure Laterality Date  . Cataract extraction    . Coronary angioplasty with stent placement  03/25/2014    LAD irreg, RI 80%/95% (med Rx), RCA 100%-->0 w/ overlapping DES x 2, EF OK.    No family history on file.  History   Social History  . Marital Status: Single    Spouse Name: N/A    Number of Children: N/A  . Years of Education: N/A   Occupational History  . Not on file.   Social History Main Topics  . Smoking status: Never Smoker   . Smokeless tobacco: Not on file  . Alcohol Use: No  . Drug Use: No  . Sexual Activity: Not Currently   Other Topics  Concern  . Not on file   Social History Narrative  . No narrative on file    ROS: Complains of constipation, bilateral knee arthritis, otherwise see history of present illness  BP 120/62  Pulse 85  Ht  (1.626 m)  Wt 152 lb (68.947 kg)  BMI 26.08 kg/m2  PHYSICAL EXAM: Well-nournished, in no acute distress. Neck: No JVD, HJR, Bruit, or thyroid enlargement  Lungs: No tachypnea, clear without wheezing, rales, or rhonchi  Cardiovascular: RRR, PMI not displaced, heart sounds normal, no murmurs, gallops, bruit, thrill, or heave.  Abdomen: BS normal. Soft without organomegaly, masses, lesions or tenderness.  Extremities: without cyanosis, clubbing or edema. Good distal pulses bilateral  SKin: Warm, no lesions or rashes   Musculoskeletal: No deformities  Neuro: no focal signs   Wt Readings from Last 3  Encounters:  08/13/14 152 lb (68.947 kg)  06/20/14 153 lb (69.4 kg)  04/11/14 155 lb (70.308 kg)     - LHC (03/25/14):  Ostial/prox RI 70-80%, mid RI 95%, mid RCA occluded (culprit).  EF 60% with inferobasal HK.  RI small in distribution and med Rx recommended.  PCI:  Overlapping Xience Alpine (2.5 x 12 mm and 3 x 8 mm) DES to mid RCA.

## 2014-09-27 ENCOUNTER — Telehealth: Payer: Self-pay | Admitting: Interventional Cardiology

## 2014-09-27 NOTE — Telephone Encounter (Signed)
New problem   Pt want nurse to know that she has stop using her Clopidogrel 75mg .. She is having purple skin patches while using this medication. Please advise pt.

## 2014-09-27 NOTE — Telephone Encounter (Signed)
returned pt call. pt sts that she has not taken her Plavix today. pt is concerned about bruising of the skin. adv her that Plavix should not be stopped, she would put her self at risk for stent restenosis.pt adv to resume Plavix today. pt agreeable and verbalized understanding.

## 2014-10-18 ENCOUNTER — Telehealth: Payer: Self-pay | Admitting: Interventional Cardiology

## 2014-10-18 NOTE — Telephone Encounter (Signed)
New message     Patient calling need to discuss medication clopidogrel  75 mg

## 2014-10-18 NOTE — Telephone Encounter (Signed)
Returned pt call.pt reports that she is still having bruising on her arms and legs.pt sts the bruising goes away and comes back.pt denies injuring herself. Pt denies any other symptoms.pt adv again that Plavix (antiplatelet) therapy can cause bruising. Pt instructed to NOT interrupt Plavix therapy and to take as prescribed.pt adv to keep upcoming appt with Dr.Smith on 11/25.pt adv that I willf wd an update to Dr.Smith. Pt agreeable and verbalized understanding.

## 2014-11-07 ENCOUNTER — Ambulatory Visit (INDEPENDENT_AMBULATORY_CARE_PROVIDER_SITE_OTHER): Payer: Medicare Other | Admitting: Interventional Cardiology

## 2014-11-07 ENCOUNTER — Encounter: Payer: Self-pay | Admitting: Interventional Cardiology

## 2014-11-07 VITALS — BP 126/70 | HR 61 | Ht 64.0 in | Wt 151.8 lb

## 2014-11-07 DIAGNOSIS — I252 Old myocardial infarction: Secondary | ICD-10-CM

## 2014-11-07 DIAGNOSIS — I10 Essential (primary) hypertension: Secondary | ICD-10-CM

## 2014-11-07 DIAGNOSIS — I2510 Atherosclerotic heart disease of native coronary artery without angina pectoris: Secondary | ICD-10-CM

## 2014-11-07 DIAGNOSIS — E785 Hyperlipidemia, unspecified: Secondary | ICD-10-CM

## 2014-11-07 NOTE — Patient Instructions (Signed)
Your physician recommends that you schedule a follow-up appointment in:  April 2016  Your physician recommends that you return for fasting lab work in:  April on day of appt with Dr. Katrinka Blazing  Your physician has recommended you make the following change in your medication:  Stop Clopidogrel at the end of March 2016

## 2014-11-07 NOTE — Progress Notes (Signed)
Patient ID: [name], female   DOB: [birth year], 73 y.o.   MRN: [id number]    [address] [place], [place]  [address] Phone: [phone] Fax:  [phone]  Date:  11/07/2014   ID:  [name], DOB 1941/07/01, MRN [id number]  PCP:  Julieanne Manson, MD   ASSESSMENT:  1. Coronary artery disease with inferior infarction in April 2015 treated with overlapping DES. Small ramus intermedius with 80-90% stenosis treated with medication. No angina. 2. Dual antiplatelet therapy with bruising 3. Essential hypertension, stable 4. Hyperlipidemia on therapy  PLAN:  1. We'll discontinue clopidogrel at the end of March 2. Would hold off on tooth extraction until April after getting off of clopidogrel 3. Office visit in 5 months 6. Continue aerobic activity. Call if any angina   SUBJECTIVE: [name] is a 73 y.o. female who is doing well. She was taking high-dose ibuprofen when she developed acute chest pain. She received overlapping drug-eluting stents in the mid right coronary. She has done well since that time. She has residual ramus intermedius disease and is being treated medically. She is asymptomatic with reference to angina.  She is on assistance and gets all of her medications including aspirin for one dollar per prescription.   Wt Readings from Last 3 Encounters:  11/07/14 151 lb 12.8 oz (68.856 kg)  08/13/14 152 lb (68.947 kg)  06/20/14 153 lb (69.4 kg)     Past Medical History  Diagnosis Date  . Hypertension   . Arthritis   . ST elevation myocardial infarction (STEMI) of inferior wall 03/25/2014    DES x2 RCA    . Coronary artery disease     a. Inf STEMI (03/2014) => LHC (03/25/14):  Ostial/prox RI 70-80%, mid RI 95%, mid RCA occluded (culprit).  EF 60% with inferobasal HK.  RI small in distribution and med Rx recommended.  PCI:  Overlapping Xience Alpine (2.5 x 12 mm and 3 x 8 mm) DES to mid RCA.    Marland Kitchen Hyperlipidemia   . Microcytic anemia      Current Outpatient Prescriptions  Medication Sig Dispense Refill  . aspirin (ASPIRIN LOW DOSE) 81 MG EC tablet Take 1 tablet (81 mg total) by mouth daily. Swallow whole. 30 tablet 5  . atorvastatin (LIPITOR) 40 MG tablet Take 1 tablet (40 mg total) by mouth daily. 30 tablet 11  . clopidogrel (PLAVIX) 75 MG tablet Take 1 tablet (75 mg total) by mouth daily. 30 tablet 11  . hydrochlorothiazide (HYDRODIURIL) 25 MG tablet Take 1 tablet (25 mg total) by mouth daily. 25 tablet 10  . hydrOXYzine (ATARAX/VISTARIL) 25 MG tablet Take 25 mg by mouth as needed.    . latanoprost (XALATAN) 0.005 % ophthalmic solution Place 1 drop into both eyes at bedtime.    . metoprolol succinate (TOPROL-XL) 50 MG 24 hr tablet Take 1 tablet (50 mg total) by mouth daily. 30 tablet 11  . nitroGLYCERIN (NITROSTAT) 0.4 MG SL tablet Place 1 tablet (0.4 mg total) under the tongue every 5 (five) minutes as needed for chest pain. 25 tablet 3   No current facility-administered medications for this visit.    Allergies:    Allergies  Allergen Reactions  . Imdur [Isosorbide Dinitrate] Itching    Social History:  The patient  reports that she has never smoked. She does not have any smokeless tobacco history on file. She reports that she does not drink alcohol or use illicit drugs.   ROS:  Please see the  history of present illness.   Easy bruising. Denies syncope. No melena. Denies dyspnea.   All other systems reviewed and negative.   OBJECTIVE: VS:  BP 126/70 mmHg  Pulse 61  Ht 5\' 4"  (1.626 m)  Wt 151 lb 12.8 oz (68.856 kg)  BMI 26.04 kg/m2  SpO2 99% Well nourished, well developed, in no acute distress, mildly obese HEENT: normal Neck: JVD flat. Carotid bruit absent  Cardiac:  normal S1, S2; RRR; no murmur Lungs:  clear to auscultation bilaterally, no wheezing, rhonchi or rales Abd: soft, nontender, no hepatomegaly Ext: Edema absent. Pulses 2+ and symmetric Skin: warm and dry Neuro:  CNs 2-12 intact, no focal  abnormalities noted  EKG:  Not repeated       Signed, Darci NeedleHenry W. B. Yanitza Shvartsman III, MD 11/07/2014 2:38 PM

## 2014-11-13 ENCOUNTER — Telehealth: Payer: Self-pay | Admitting: Interventional Cardiology

## 2014-11-13 DIAGNOSIS — E785 Hyperlipidemia, unspecified: Secondary | ICD-10-CM

## 2014-11-13 NOTE — Telephone Encounter (Signed)
LMTCB

## 2014-11-13 NOTE — Telephone Encounter (Signed)
New problem   Pt has appt 03/06/14 @ 9:15 and need orders for labs entered.

## 2014-11-14 NOTE — Telephone Encounter (Signed)
Lab orders in

## 2014-11-22 ENCOUNTER — Encounter (HOSPITAL_COMMUNITY): Payer: Self-pay | Admitting: Interventional Cardiology

## 2014-11-24 ENCOUNTER — Other Ambulatory Visit: Payer: Self-pay | Admitting: Interventional Cardiology

## 2015-01-21 ENCOUNTER — Other Ambulatory Visit: Payer: Self-pay

## 2015-01-21 MED ORDER — CLOPIDOGREL BISULFATE 75 MG PO TABS: 75.0000 mg | ORAL_TABLET | Freq: Every day | ORAL | Status: DC

## 2015-01-23 DIAGNOSIS — H5210 Myopia, unspecified eye: Secondary | ICD-10-CM | POA: Diagnosis not present

## 2015-01-28 ENCOUNTER — Other Ambulatory Visit: Payer: Self-pay | Admitting: Nurse Practitioner

## 2015-01-28 MED ORDER — CLOPIDOGREL BISULFATE 75 MG PO TABS: 75.0000 mg | ORAL_TABLET | Freq: Every day | ORAL | Status: DC

## 2015-01-31 ENCOUNTER — Other Ambulatory Visit: Payer: Self-pay | Admitting: Physician Assistant

## 2015-02-01 DIAGNOSIS — M25579 Pain in unspecified ankle and joints of unspecified foot: Secondary | ICD-10-CM | POA: Diagnosis not present

## 2015-02-01 DIAGNOSIS — M129 Arthropathy, unspecified: Secondary | ICD-10-CM | POA: Diagnosis not present

## 2015-02-01 DIAGNOSIS — I10 Essential (primary) hypertension: Secondary | ICD-10-CM | POA: Diagnosis not present

## 2015-02-01 DIAGNOSIS — R7301 Impaired fasting glucose: Secondary | ICD-10-CM | POA: Diagnosis not present

## 2015-02-06 ENCOUNTER — Telehealth: Payer: Self-pay | Admitting: Interventional Cardiology

## 2015-02-06 NOTE — Telephone Encounter (Signed)
Okay to go to dentist but cannot stop plavix until April. Therefore, any extraction, etc. needs to be delayed.

## 2015-02-06 NOTE — Telephone Encounter (Signed)
New Message    Patient is calling because she needs to have a tooth extracted and needs to know if she can. Please give a call.

## 2015-02-06 NOTE — Telephone Encounter (Signed)
Spoke with pt and asked about tooth extraction. Pt states that she actually hasn't been to Dentist office yet. Pt states that she wanted to get clearance prior to going to dentist. Informed pt that I would send this information over to Dr. Katrinka Blazing but that typically we receive documents from the Dentist office for cardiac clearance for dental work. Pt stated again that she was calling the office to get clearance from Dr. Katrinka Blazing first. Assured pt that I would get this information routed to Dr. Katrinka Blazing for review and advisement. Pt verbalized understanding and was in agreement with this plan.

## 2015-02-07 NOTE — Telephone Encounter (Signed)
Called and spoke with pt. Informed her that Dr. Katrinka Blazing said that it was ok for her to see the dentist but that she will not be able to stop her Plavix until April so an extraction would have to be delayed.   Pt verbalized understanding and was in agreement with this plan.

## 2015-02-07 NOTE — Telephone Encounter (Signed)
Left message to call back  

## 2015-02-07 NOTE — Telephone Encounter (Signed)
F/U ° ° ° ° ° ° ° ° °Pt returning call from yesterday. Please call back °

## 2015-02-18 ENCOUNTER — Other Ambulatory Visit: Payer: Self-pay | Admitting: Interventional Cardiology

## 2015-02-18 ENCOUNTER — Telehealth: Payer: Self-pay | Admitting: *Deleted

## 2015-02-18 NOTE — Telephone Encounter (Signed)
Pt should continue dual antiplatelet therapy until instructed otherwise.

## 2015-02-18 NOTE — Telephone Encounter (Signed)
Patient is questioning whether ir not she should continue the asa 81mg . If so, ok to refill? Please advise. Thanks, MI

## 2015-02-21 NOTE — Telephone Encounter (Signed)
Will need to trake aspirin 81 mg indefinitely.

## 2015-02-22 NOTE — Telephone Encounter (Signed)
Called patient back with Dr. Lonn Georgia recommendation. Patient is to take aspirin 81 mg indefinitely. Patient verbalized understanding.

## 2015-03-01 DIAGNOSIS — E784 Other hyperlipidemia: Secondary | ICD-10-CM | POA: Diagnosis not present

## 2015-03-01 DIAGNOSIS — E119 Type 2 diabetes mellitus without complications: Secondary | ICD-10-CM | POA: Diagnosis not present

## 2015-03-01 DIAGNOSIS — M129 Arthropathy, unspecified: Secondary | ICD-10-CM | POA: Diagnosis not present

## 2015-03-01 DIAGNOSIS — Z1239 Encounter for other screening for malignant neoplasm of breast: Secondary | ICD-10-CM | POA: Diagnosis not present

## 2015-03-01 DIAGNOSIS — I2510 Atherosclerotic heart disease of native coronary artery without angina pectoris: Secondary | ICD-10-CM | POA: Diagnosis not present

## 2015-03-01 DIAGNOSIS — I10 Essential (primary) hypertension: Secondary | ICD-10-CM | POA: Diagnosis not present

## 2015-03-07 ENCOUNTER — Encounter: Payer: Self-pay | Admitting: Interventional Cardiology

## 2015-03-07 ENCOUNTER — Other Ambulatory Visit: Payer: Self-pay

## 2015-03-07 ENCOUNTER — Other Ambulatory Visit (INDEPENDENT_AMBULATORY_CARE_PROVIDER_SITE_OTHER): Payer: Commercial Managed Care - HMO | Admitting: *Deleted

## 2015-03-07 ENCOUNTER — Ambulatory Visit (INDEPENDENT_AMBULATORY_CARE_PROVIDER_SITE_OTHER): Payer: Commercial Managed Care - HMO | Admitting: Interventional Cardiology

## 2015-03-07 VITALS — BP 110/62 | HR 58 | Ht 64.0 in | Wt 150.4 lb

## 2015-03-07 DIAGNOSIS — E785 Hyperlipidemia, unspecified: Secondary | ICD-10-CM

## 2015-03-07 DIAGNOSIS — I25119 Atherosclerotic heart disease of native coronary artery with unspecified angina pectoris: Secondary | ICD-10-CM

## 2015-03-07 DIAGNOSIS — I252 Old myocardial infarction: Secondary | ICD-10-CM

## 2015-03-07 DIAGNOSIS — I10 Essential (primary) hypertension: Secondary | ICD-10-CM

## 2015-03-07 LAB — LIPID PANEL
Cholesterol: 145 mg/dL (ref 0–200)
HDL: 50.4 mg/dL (ref >39.00)
LDL CALC: 77 mg/dL (ref 0–99)
NONHDL: 94.6
Total CHOL/HDL Ratio: 3
Triglycerides: 89 mg/dL (ref 0.0–149.0)
VLDL: 17.8 mg/dL (ref 0.0–40.0)

## 2015-03-07 LAB — ALT: ALT: 16 U/L (ref 0–35)

## 2015-03-07 MED ORDER — CLOPIDOGREL BISULFATE 75 MG PO TABS: 75.0000 mg | ORAL_TABLET | Freq: Every day | ORAL | Status: DC

## 2015-03-07 NOTE — Progress Notes (Signed)
Cardiology Office Note   Date:  03/07/2015   ID:  [name], DOB 1941-03-05, MRN [id number]  PCP:  Julieanne Manson, MD  Cardiologist:   Lesleigh Noe, MD   No chief complaint on file.     History of Present Illness: [name] is a 74 y.o. female who presents for CAD follow-up. She denies angina. She is followed a heart healthy diet. No palpitations, syncope, orthopnea. She has not had lower extremity edema. She has not needed sublingual nitroglycerin.    Past Medical History  Diagnosis Date  . Hypertension   . Arthritis   . ST elevation myocardial infarction (STEMI) of inferior wall 03/25/2014    DES x2 RCA    . Coronary artery disease     a. Inf STEMI (03/2014) => LHC (03/25/14):  Ostial/prox RI 70-80%, mid RI 95%, mid RCA occluded (culprit).  EF 60% with inferobasal HK.  RI small in distribution and med Rx recommended.  PCI:  Overlapping Xience Alpine (2.5 x 12 mm and 3 x 8 mm) DES to mid RCA.    Marland Kitchen Hyperlipidemia   . Microcytic anemia     Past Surgical History  Procedure Laterality Date  . Cataract extraction    . Coronary angioplasty with stent placement  03/25/2014    LAD irreg, RI 80%/95% (med Rx), RCA 100%-->0 w/ overlapping DES x 2, EF OK.  . Left heart cath N/A 03/25/2014    Procedure: LEFT HEART CATH;  Surgeon: Lesleigh Noe, MD;  Location: [facility] CATH LAB;  Service: Cardiovascular;  Laterality: N/A;  . Percutaneous coronary stent intervention (pci-s)  03/25/2014    Procedure: PERCUTANEOUS CORONARY STENT INTERVENTION (PCI-S);  Surgeon: Lesleigh Noe, MD;  Location: [facility] CATH LAB;  Service: Cardiovascular;;     Current Outpatient Prescriptions  Medication Sig Dispense Refill  . ASPIRIN LOW DOSE 81 MG EC tablet TAKE 1 TABLET BY MOUTH EVERY DAY 30 tablet 0  . atorvastatin (LIPITOR) 40 MG tablet Take 1 tablet (40 mg total) by mouth daily. 30 tablet 11  . clopidogrel (PLAVIX) 75 MG tablet Take 1 tablet (75 mg total) by mouth daily. 30 tablet 3  .  hydrochlorothiazide (HYDRODIURIL) 25 MG tablet Take 1 tablet (25 mg total) by mouth daily. 25 tablet 10  . hydrOXYzine (ATARAX/VISTARIL) 25 MG tablet Take 25 mg by mouth as needed.    . latanoprost (XALATAN) 0.005 % ophthalmic solution Place 1 drop into both eyes at bedtime.    . metoprolol succinate (TOPROL-XL) 50 MG 24 hr tablet Take 1 tablet (50 mg total) by mouth daily. 30 tablet 11  . nitroGLYCERIN (NITROSTAT) 0.4 MG SL tablet Place 1 tablet (0.4 mg total) under the tongue every 5 (five) minutes as needed for chest pain. 25 tablet 3  . VOLTAREN 1 % GEL Apply topically as directed.      No current facility-administered medications for this visit.    Allergies:   Imdur    Social History:  The patient  reports that she has never smoked. She does not have any smokeless tobacco history on file. She reports that she does not drink alcohol or use illicit drugs.   Family History:  The patient's family history includes Stroke in her sister.    ROS:  Please see the history of present illness.   Otherwise, review of systems are positive for significant bilateral knee discomfort with ambulation. Out in the right ankle. Otherwise unremarkable..   All other systems are reviewed  and negative.    PHYSICAL EXAM: VS:  BP 110/62 mmHg  Pulse 58  Ht 5\' 4"  (1.626 m)  Wt 150 lb 6.4 oz (68.221 kg)  BMI 25.80 kg/m2  SpO2 99% , BMI Body mass index is 25.8 kg/(m^2). GEN: Well nourished, well developed, in no acute distress HEENT: normal Neck: no JVD, carotid bruits, or masses Cardiac: RRR; no murmurs, rubs, or gallops,no edema  Respiratory:  clear to auscultation bilaterally, normal work of breathing GI: soft, nontender, nondistended, + BS MS: no deformity or atrophy Skin: warm and dry, no rash Neuro:  Strength and sensation are intact Psych: euthymic mood, full affect   EKG:  EKG is not ordered today.    Recent Labs: 03/25/2014: Hemoglobin 12.0; Platelets 149* 03/26/2014: BUN 10; Creatinine  0.60; Potassium 4.3; Pro B Natriuretic peptide (BNP) 133.2*; Sodium 140; TSH 0.402 06/13/2014: ALT 19    Lipid Panel    Component Value Date/Time   CHOL 137 06/13/2014 0825   TRIG 86.0 06/13/2014 0825   HDL 55.10 06/13/2014 0825   CHOLHDL 2 06/13/2014 0825   VLDL 17.2 06/13/2014 0825   LDLCALC 65 06/13/2014 0825      Wt Readings from Last 3 Encounters:  03/07/15 150 lb 6.4 oz (68.221 kg)  11/07/14 151 lb 12.8 oz (68.856 kg)  08/13/14 152 lb (68.947 kg)      Other studies Reviewed: Additional studies/ records that were reviewed today include: None.    ASSESSMENT AND PLAN:  Coronary artery disease with unspecified angina pectoris: No angina. Limited activity due to bilateral knee arthritis.  Essential hypertension: Controlled  History of acute inferior wall MI  Hyperlipidemia: On therapy and followed by primary care    Current medicines are reviewed at length with the patient today.  The patient does not have concerns regarding medicines.  The following changes have been made:  Discontinue Plavix October 1.  Labs/ tests ordered today include: None  No orders of the defined types were placed in this encounter.     Disposition:   FU with Mendel Ryder in 8 months   Signed, Lesleigh Noe, MD  03/07/2015 9:44 AM    [facility] [address] [place], [place], [place]  [address] Phone: [phone]; Fax: [phone]

## 2015-03-07 NOTE — Patient Instructions (Signed)
Your physician has recommended you make the following change in your medication:  1) You can STOP Plavix October 1,2016  Take all other medications as prescribed  Your physician discussed the importance of regular exercise and recommended that you start or continue a regular exercise program for good health.   Your physician wants you to follow-up in: 6-9 months with Dr.Smith You will receive a reminder letter in the mail two months in advance. If you don't receive a letter, please call our office to schedule the follow-up appointment.

## 2015-03-18 ENCOUNTER — Telehealth: Payer: Self-pay | Admitting: Interventional Cardiology

## 2015-03-18 NOTE — Telephone Encounter (Signed)
Will fax over information to [facility] office

## 2015-03-18 NOTE — Telephone Encounter (Signed)
Will forward to Dr. Smith for review and advisement 

## 2015-03-18 NOTE — Telephone Encounter (Signed)
She does not need antibiotics 

## 2015-03-18 NOTE — Telephone Encounter (Signed)
New message      What dental office are you calling from? [facility] What is your office phone and fax number? Fax [phone] 1. What type of procedure is the patient having performed?  Dental cleaning  2. What date is procedure scheduled?  03-19-15  3. What is your question (ex. Antibiotics prior to procedure, holding medication-we need to know how long dentist wants pt to hold med)?  Will pt need an antibiotic prior to appt?

## 2015-03-27 ENCOUNTER — Telehealth: Payer: Self-pay | Admitting: Interventional Cardiology

## 2015-03-27 NOTE — Telephone Encounter (Signed)
error 

## 2015-05-09 ENCOUNTER — Telehealth: Payer: Self-pay | Admitting: Interventional Cardiology

## 2015-05-09 NOTE — Telephone Encounter (Signed)
New problem   Pt want to know if there is any medications she need to spot using since her appt isn't until December. Please advise pt.

## 2015-05-09 NOTE — Telephone Encounter (Signed)
Returned pt call. Pt would like clarification as to which medication Dr.Smith wants her to stop in the fall. Adv her that at her March appt Dr.Smih instructed her to STOP Plavix October 1,2016 Pt verbalized understanding.

## 2015-06-14 DIAGNOSIS — M129 Arthropathy, unspecified: Secondary | ICD-10-CM | POA: Diagnosis not present

## 2015-06-14 DIAGNOSIS — E119 Type 2 diabetes mellitus without complications: Secondary | ICD-10-CM | POA: Diagnosis not present

## 2015-06-14 DIAGNOSIS — Z1211 Encounter for screening for malignant neoplasm of colon: Secondary | ICD-10-CM | POA: Diagnosis not present

## 2015-06-14 DIAGNOSIS — I10 Essential (primary) hypertension: Secondary | ICD-10-CM | POA: Diagnosis not present

## 2015-06-14 DIAGNOSIS — E784 Other hyperlipidemia: Secondary | ICD-10-CM | POA: Diagnosis not present

## 2015-06-26 ENCOUNTER — Telehealth: Payer: Self-pay | Admitting: *Deleted

## 2015-06-26 NOTE — Telephone Encounter (Signed)
Returned pt call. Adv her that is is ok to use generic coated Asa 81mg  daily. She verbalized understanding

## 2015-06-26 NOTE — Telephone Encounter (Signed)
Patient would like a call to discuss the aspirin that she has. She stated that this time it is different from what she got last time and would like to know if ok to take. Please advise. Thanks, MI

## 2015-07-03 ENCOUNTER — Telehealth: Payer: Self-pay | Admitting: Interventional Cardiology

## 2015-07-03 NOTE — Telephone Encounter (Signed)
New message     Request for surgical clearance:  1. What type of surgery is being performed? Extraction  2. When is this surgery scheduled? July 26  3. Are there any medications that need to be held prior to surgery and how long? Not sure, please advise   4. Name of physician performing surgery? Dr. Lorin Picket  5. What is your office phone and fax number? [facility] [phone]   Fax [phone]  Dr. Roby Lofts office would like list of patients medications also Please call to advise

## 2015-07-04 NOTE — Telephone Encounter (Signed)
Dr.Smith is out of the office will ask the office DOD to adv

## 2015-07-04 NOTE — Telephone Encounter (Signed)
Called dentist office. lmom Per Dr.Turner office DOD. It is up to pt dentist/oral surgeon to determine whether or not they want pt to HOLD any medications. If they need an ok to HOLD any cardiac meds,then we would then adv

## 2015-07-04 NOTE — Telephone Encounter (Signed)
Follow up    Returning your call. Will be in office until 4 today and will return on Monday.

## 2015-07-04 NOTE — Telephone Encounter (Signed)
Returned call. 2nd attempt to reach pt dentist office. Lmtcb if assistance is still needed

## 2015-07-08 NOTE — Telephone Encounter (Signed)
Debbie @ pt dentist office aware. Pt does not need to hold Plavix for her tooth extraction scheduled for 7/26.

## 2015-08-23 DIAGNOSIS — Z0100 Encounter for examination of eyes and vision without abnormal findings: Secondary | ICD-10-CM | POA: Diagnosis not present

## 2015-09-03 ENCOUNTER — Other Ambulatory Visit: Payer: Self-pay | Admitting: Physician Assistant

## 2015-09-03 ENCOUNTER — Other Ambulatory Visit: Payer: Self-pay | Admitting: Interventional Cardiology

## 2015-09-03 MED ORDER — METOPROLOL SUCCINATE ER 50 MG PO TB24: 50.0000 mg | ORAL_TABLET | Freq: Every day | ORAL | Status: DC

## 2015-09-04 ENCOUNTER — Other Ambulatory Visit: Payer: Self-pay | Admitting: Physician Assistant

## 2015-09-13 ENCOUNTER — Other Ambulatory Visit: Payer: Self-pay | Admitting: *Deleted

## 2015-09-13 MED ORDER — ATORVASTATIN CALCIUM 40 MG PO TABS: 40.0000 mg | ORAL_TABLET | Freq: Every day | ORAL | Status: DC

## 2015-09-17 ENCOUNTER — Telehealth: Payer: Self-pay | Admitting: Interventional Cardiology

## 2015-09-17 NOTE — Telephone Encounter (Signed)
Returned pt call. lmtcb Per Dr.Smith last o/v pt is d/c Plavix starting on 10/1

## 2015-09-17 NOTE — Telephone Encounter (Signed)
New message      Pt cannot remember which medication she is to stop using starting on 09-14-15.  Please call

## 2015-09-26 DIAGNOSIS — M1A9XX0 Chronic gout, unspecified, without tophus (tophi): Secondary | ICD-10-CM | POA: Diagnosis not present

## 2015-09-26 DIAGNOSIS — E119 Type 2 diabetes mellitus without complications: Secondary | ICD-10-CM | POA: Diagnosis not present

## 2015-09-26 DIAGNOSIS — I2510 Atherosclerotic heart disease of native coronary artery without angina pectoris: Secondary | ICD-10-CM | POA: Diagnosis not present

## 2015-09-26 DIAGNOSIS — I10 Essential (primary) hypertension: Secondary | ICD-10-CM | POA: Diagnosis not present

## 2015-09-26 DIAGNOSIS — Z23 Encounter for immunization: Secondary | ICD-10-CM | POA: Diagnosis not present

## 2015-09-26 DIAGNOSIS — M129 Arthropathy, unspecified: Secondary | ICD-10-CM | POA: Diagnosis not present

## 2015-09-26 DIAGNOSIS — E2839 Other primary ovarian failure: Secondary | ICD-10-CM | POA: Diagnosis not present

## 2015-10-03 ENCOUNTER — Other Ambulatory Visit: Payer: Self-pay | Admitting: Interventional Cardiology

## 2015-10-03 NOTE — Telephone Encounter (Signed)
Per last office with Dr Katrinka Blazing: Patient Instructions     Your physician has recommended you make the following change in your medication:  1) You can STOP Plavix October 1,2016

## 2015-10-19 ENCOUNTER — Other Ambulatory Visit: Payer: Self-pay | Admitting: Internal Medicine

## 2015-10-19 DIAGNOSIS — E2839 Other primary ovarian failure: Secondary | ICD-10-CM

## 2015-10-30 ENCOUNTER — Other Ambulatory Visit: Payer: Self-pay | Admitting: Interventional Cardiology

## 2015-11-15 ENCOUNTER — Other Ambulatory Visit: Payer: Self-pay

## 2015-11-23 ENCOUNTER — Other Ambulatory Visit: Payer: Self-pay | Admitting: Physician Assistant

## 2015-12-03 ENCOUNTER — Encounter: Payer: Self-pay | Admitting: Nurse Practitioner

## 2015-12-03 ENCOUNTER — Ambulatory Visit (INDEPENDENT_AMBULATORY_CARE_PROVIDER_SITE_OTHER): Payer: Commercial Managed Care - HMO | Admitting: Nurse Practitioner

## 2015-12-03 VITALS — BP 122/78 | HR 92 | Ht 64.0 in | Wt 155.1 lb

## 2015-12-03 DIAGNOSIS — I2510 Atherosclerotic heart disease of native coronary artery without angina pectoris: Secondary | ICD-10-CM

## 2015-12-03 DIAGNOSIS — I10 Essential (primary) hypertension: Secondary | ICD-10-CM | POA: Diagnosis not present

## 2015-12-03 DIAGNOSIS — E785 Hyperlipidemia, unspecified: Secondary | ICD-10-CM | POA: Diagnosis not present

## 2015-12-03 MED ORDER — HYDROCHLOROTHIAZIDE 25 MG PO TABS: 25.0000 mg | ORAL_TABLET | Freq: Every day | ORAL | Status: AC

## 2015-12-03 NOTE — Progress Notes (Signed)
CARDIOLOGY OFFICE NOTE  Date:  12/03/2015    [name] Date of Birth: 11-Feb-1941 Medical Record #[id number]  PCP:  Dorrene German, MD  Cardiologist:  Katrinka Blazing    Chief Complaint  Patient presents with  . Coronary Artery Disease    9 month check - seen for Dr. Katrinka Blazing  . Hypertension  . Hyperlipidemia    History of Present Illness: [name] is a 74 y.o. female who presents today for a follow up visit. Seen for Dr. Katrinka Blazing.   She has a history of HTN, HLD, anemia and known CAD with prior inferior STEMI in 03/2014 with PCI to the RCA. Has residual disease in the ramus intermediate - treated medically.   Last seen in March of 2016 and was felt to be doing well.  Comes back today. Here alone. Doing ok. No issues. No chest pain. Not short of breath[facility] [employer] [facility] Has enjoyed traveling. She is happy with how she is doing.   Past Medical History  Diagnosis Date  . Hypertension   . Arthritis   . ST elevation myocardial infarction (STEMI) of inferior wall (HCC) 03/25/2014    DES x2 RCA    . Coronary artery disease     a. Inf STEMI (03/2014) => LHC (03/25/14):  Ostial/prox RI 70-80%, mid RI 95%, mid RCA occluded (culprit).  EF 60% with inferobasal HK.  RI small in distribution and med Rx recommended.  PCI:  Overlapping Xience Alpine (2.5 x 12 mm and 3 x 8 mm) DES to mid RCA.    Marland Kitchen Hyperlipidemia   . Microcytic anemia     Past Surgical History  Procedure Laterality Date  . Cataract extraction    . Coronary angioplasty with stent placement  03/25/2014    LAD irreg, RI 80%/95% (med Rx), RCA 100%-->0 w/ overlapping DES x 2, EF OK.  . Left heart cath N/A 03/25/2014    Procedure: LEFT HEART CATH;  Surgeon: Lesleigh Noe, MD;  Location: [facility] CATH LAB;  Service: Cardiovascular;  Laterality: N/A;  . Percutaneous coronary stent intervention (pci-s)  03/25/2014    Procedure: PERCUTANEOUS CORONARY STENT INTERVENTION (PCI-S);  Surgeon: Lesleigh Noe, MD;   Location: [facility] CATH LAB;  Service: Cardiovascular;;     Medications: Current Outpatient Prescriptions  Medication Sig Dispense Refill  . ASPIRIN LOW DOSE 81 MG EC tablet TAKE 1 TABLET BY MOUTH EVERY DAY 30 tablet 0  . atorvastatin (LIPITOR) 40 MG tablet Take 1 tablet (40 mg total) by mouth daily. 90 tablet 0  . hydrochlorothiazide (HYDRODIURIL) 25 MG tablet Take 1 tablet (25 mg total) by mouth daily. 90 tablet 3  . hydrOXYzine (ATARAX/VISTARIL) 25 MG tablet Take 25 mg by mouth as needed.    . latanoprost (XALATAN) 0.005 % ophthalmic solution Place 1 drop into both eyes at bedtime.    . metoprolol succinate (TOPROL-XL) 50 MG 24 hr tablet Take 1 tablet (50 mg total) by mouth daily. 90 tablet 3  . nitroGLYCERIN (NITROSTAT) 0.4 MG SL tablet Place 1 tablet (0.4 mg total) under the tongue every 5 (five) minutes as needed for chest pain. 25 tablet 3  . VOLTAREN 1 % GEL Apply topically as directed.      No current facility-administered medications for this visit.    Allergies: Allergies  Allergen Reactions  . Imdur [Isosorbide Dinitrate] Itching    Social History: The patient  reports that she has never smoked. She does not  have any smokeless tobacco history on file. She reports that she does not drink alcohol or use illicit drugs.   Family History: The patient's family history includes Stroke in her sister.   Review of Systems: Please see the history of present illness.   Otherwise, the review of systems is positive for none.   All other systems are reviewed and negative.   Physical Exam: VS:  BP 122/78 mmHg  Pulse 92  Ht  (1.626 m)  Wt 155 lb 1.9 oz (70.362 kg)  BMI 26.61 kg/m2 .  BMI Body mass index is 26.61 kg/(m^2).  Wt Readings from Last 3 Encounters:  12/03/15 155 lb 1.9 oz (70.362 kg)  03/07/15 150 lb 6.4 oz (68.221 kg)  11/07/14 151 lb 12.8 oz (68.856 kg)    General: Pleasant. Well developed, well nourished and in no acute distress.  Neck: Supple Cardiac: Regular  rate and rhythm. No murmurs, rubs, or gallops. No edema.  Respiratory:  Lungs are clear to auscultation bilaterally with normal work of breathing.  MS: No deformity or atrophy. Gait and ROM intact. Skin: Warm and dry. Color is normal.  Neuro:  Strength and sensation are intact and no gross focal deficits noted.  Psych: Alert, appropriate and with normal affect.   LABORATORY DATA:  EKG:  EKG is ordered today. This demonstrates NSR with incomplete RBBB.  Lab Results  Component Value Date   WBC 7.9 03/25/2014   HGB 12.0 03/25/2014   HCT 35.8* 03/25/2014   PLT 149* 03/25/2014   GLUCOSE 110* 03/26/2014   CHOL 145 03/07/2015   TRIG 89.0 03/07/2015   HDL 50.40 03/07/2015   LDLCALC 77 03/07/2015   ALT 16 03/07/2015   AST 23 06/13/2014   NA 140 03/26/2014   K 4.3 03/26/2014   CL 104 03/26/2014   CREATININE 0.60 03/26/2014   BUN 10 03/26/2014   CO2 20 03/26/2014   TSH 0.402 03/26/2014   INR 1.22 03/25/2014    BNP (last 3 results) No results for input(s): BNP in the last 8760 hours.  ProBNP (last 3 results) No results for input(s): PROBNP in the last 8760 hours.   Other Studies Reviewed Today:  Cardiac Cath from 03/2014 IMPRESSIONS: 1. Acute inferior ST elevation microinfarction related to acute occlusion of the mid right coronary  2. Successful drug-eluting stent implantation in the mid right coronary, overlapping and post dilated to 3.0 mm in diameter.  3. Severe ramus intermedius disease. This vessel is relatively small in distribution and can easily be treated with medical therapy. There is irregularity in the mid LAD but no significant obstruction is noted in the LAD or other vascular territories not already mentioned.  4. Overall normal LV function with inferobasal hypokinesis. EF 60%  Assessment/Plan:  Coronary artery disease with past inferior MI/PCI and has residual disease: No angina. She is doing well on her current therapy and has no symptoms.   Essential  hypertension: Controlled with her current regimen. I have refilled her HCTZ today.   Hyperlipidemia: On therapy and followed by primary care.   Current medicines are reviewed with the patient today.  The patient does not have concerns regarding medicines other than what has been noted above.  The following changes have been made:  See above.  Labs/ tests ordered today include:    Orders Placed This Encounter  Procedures  . EKG 12-Lead     Disposition:   FU with Dr. Katrinka Blazing in 6 months.   Patient is agreeable to this  plan and will call if any problems develop in the interim.   Signed: Rosalio Macadamia, RN, ANP-C 12/03/2015 9:11 AM  [facility] [address] [place], [place]  [address] Phone: [phone] Fax: [phone]

## 2015-12-03 NOTE — Patient Instructions (Addendum)
We will be checking the following labs today - NONE   Medication Instructions:    Continue with your current medicines.   I refilled your HCTZ today    Testing/Procedures To Be Arranged:  N/A  Follow-Up:   See Dr. Katrinka Blazing in 6 months    Other Special Instructions:   Keep up the good work!    If you need a refill on your cardiac medications before your next appointment, please call your pharmacy.   Call the [facility] office at [phone] if you have any questions, problems or concerns.

## 2015-12-25 DIAGNOSIS — Z719 Counseling, unspecified: Secondary | ICD-10-CM | POA: Diagnosis not present

## 2015-12-25 DIAGNOSIS — I2510 Atherosclerotic heart disease of native coronary artery without angina pectoris: Secondary | ICD-10-CM | POA: Diagnosis not present

## 2015-12-25 DIAGNOSIS — I10 Essential (primary) hypertension: Secondary | ICD-10-CM | POA: Diagnosis not present

## 2015-12-25 DIAGNOSIS — E784 Other hyperlipidemia: Secondary | ICD-10-CM | POA: Diagnosis not present

## 2015-12-25 DIAGNOSIS — M1A9XX0 Chronic gout, unspecified, without tophus (tophi): Secondary | ICD-10-CM | POA: Diagnosis not present

## 2015-12-25 DIAGNOSIS — E119 Type 2 diabetes mellitus without complications: Secondary | ICD-10-CM | POA: Diagnosis not present

## 2015-12-25 DIAGNOSIS — Z1389 Encounter for screening for other disorder: Secondary | ICD-10-CM | POA: Diagnosis not present

## 2016-01-07 DIAGNOSIS — H35351 Cystoid macular degeneration, right eye: Secondary | ICD-10-CM | POA: Diagnosis not present

## 2016-01-08 DIAGNOSIS — H34831 Tributary (branch) retinal vein occlusion, right eye, with macular edema: Secondary | ICD-10-CM | POA: Diagnosis not present

## 2016-01-08 DIAGNOSIS — H35362 Drusen (degenerative) of macula, left eye: Secondary | ICD-10-CM | POA: Diagnosis not present

## 2016-01-23 ENCOUNTER — Other Ambulatory Visit: Payer: Self-pay | Admitting: *Deleted

## 2016-01-23 MED ORDER — ATORVASTATIN CALCIUM 40 MG PO TABS: 40.0000 mg | ORAL_TABLET | Freq: Every day | ORAL | Status: DC

## 2016-02-15 ENCOUNTER — Other Ambulatory Visit: Payer: Self-pay | Admitting: Physician Assistant

## 2016-04-16 ENCOUNTER — Other Ambulatory Visit: Payer: Self-pay | Admitting: Interventional Cardiology

## 2016-07-03 ENCOUNTER — Encounter: Payer: Self-pay | Admitting: Interventional Cardiology

## 2016-07-03 ENCOUNTER — Ambulatory Visit (INDEPENDENT_AMBULATORY_CARE_PROVIDER_SITE_OTHER): Payer: Medicare HMO | Admitting: Interventional Cardiology

## 2016-07-03 VITALS — BP 102/56 | HR 67 | Ht 64.0 in | Wt 154.2 lb

## 2016-07-03 DIAGNOSIS — I10 Essential (primary) hypertension: Secondary | ICD-10-CM

## 2016-07-03 DIAGNOSIS — E785 Hyperlipidemia, unspecified: Secondary | ICD-10-CM | POA: Diagnosis not present

## 2016-07-03 DIAGNOSIS — I2510 Atherosclerotic heart disease of native coronary artery without angina pectoris: Secondary | ICD-10-CM

## 2016-07-03 NOTE — Progress Notes (Signed)
Cardiology Office Note    Date:  07/03/2016   ID:  [name], DOB 06-20-1941, MRN [id number]  PCP:  Dorrene German, MD  Cardiologist: Lesleigh Noe, MD   Chief Complaint  Patient presents with  . Coronary Artery Disease    History of Present Illness:  [name] is a 75 y.o. female returns for follow-up of known CAD, hypertension, hyperlipidemia, and acute inferior infarction in 2015.   We are now 2 years post infarct. Heart catheterization done acutely revealed: IMPRESSIONS: 1. Acute inferior ST elevation microinfarction related to acute occlusion of the mid right coronary  2. Successful drug-eluting stent implantation in the mid right coronary, overlapping and post dilated to 3.0 mm in diameter.  3. Severe ramus intermedius disease. This vessel is relatively small in distribution and can easily be treated with medical therapy. There is irregularity in the mid LAD but no significant obstruction is noted in the LAD or other vascular territories not already mentioned.  4. Overall normal LV function with inferobasal hypokinesis. EF 60%   She is doing well from cardiac standpoint. She is not had palpitations or syncope. She is off dual antiplatelet therapy now taking only one aspirin, 81 mg, daily. She is followed by Dr. Fleet Contras and there is been some recent issue with blood sugar control.  Past Medical History  Diagnosis Date  . Hypertension   . Arthritis   . ST elevation myocardial infarction (STEMI) of inferior wall (HCC) 03/25/2014    DES x2 RCA    . Coronary artery disease     a. Inf STEMI (03/2014) => LHC (03/25/14):  Ostial/prox RI 70-80%, mid RI 95%, mid RCA occluded (culprit).  EF 60% with inferobasal HK.  RI small in distribution and med Rx recommended.  PCI:  Overlapping Xience Alpine (2.5 x 12 mm and 3 x 8 mm) DES to mid RCA.    Marland Kitchen Hyperlipidemia   . Microcytic anemia     Past Surgical History  Procedure Laterality Date  . Cataract extraction     . Coronary angioplasty with stent placement  03/25/2014    LAD irreg, RI 80%/95% (med Rx), RCA 100%-->0 w/ overlapping DES x 2, EF OK.  . Left heart cath N/A 03/25/2014    Procedure: LEFT HEART CATH;  Surgeon: Lesleigh Noe, MD;  Location: [facility] CATH LAB;  Service: Cardiovascular;  Laterality: N/A;  . Percutaneous coronary stent intervention (pci-s)  03/25/2014    Procedure: PERCUTANEOUS CORONARY STENT INTERVENTION (PCI-S);  Surgeon: Lesleigh Noe, MD;  Location: [facility] CATH LAB;  Service: Cardiovascular;;    Current Medications: Outpatient Prescriptions Prior to Visit  Medication Sig Dispense Refill  . ASPIRIN LOW DOSE 81 MG EC tablet TAKE 1 TABLET BY MOUTH EVERY DAY 30 tablet 0  . atorvastatin (LIPITOR) 40 MG tablet Take 1 tablet (40 mg total) by mouth daily. 90 tablet 1  . hydrochlorothiazide (HYDRODIURIL) 25 MG tablet Take 1 tablet (25 mg total) by mouth daily. 90 tablet 3  . hydrOXYzine (ATARAX/VISTARIL) 25 MG tablet Take 25 mg by mouth as needed.    . latanoprost (XALATAN) 0.005 % ophthalmic solution Place 1 drop into both eyes at bedtime.    . metoprolol succinate (TOPROL-XL) 50 MG 24 hr tablet Take 1 tablet (50 mg total) by mouth daily. 90 tablet 3  . nitroGLYCERIN (NITROSTAT) 0.4 MG SL tablet Place 1 tablet (0.4 mg total) under the tongue every 5 (five) minutes as needed for chest  pain. 25 tablet 3  . VOLTAREN 1 % GEL Apply topically as directed.      No facility-administered medications prior to visit.     Allergies:   Imdur   Social History   Social History  . Marital Status: Single    Spouse Name: N/A  . Number of Children: N/A  . Years of Education: N/A   Social History Main Topics  . Smoking status: Never Smoker   . Smokeless tobacco: Never Used  . Alcohol Use: No  . Drug Use: No  . Sexual Activity: Not Currently   Other Topics Concern  . None   Social History Narrative     Family History:  The patient's family history includes Stroke in her sister.    ROS:   Please see the history of present illness.    Gout, joint swelling, balance, cough, and vision disturbance. Appetite changes also occurred. Weight is been stable.  All other systems reviewed and are negative.   PHYSICAL EXAM:   VS:  BP 102/56 mmHg  Pulse 67  Ht 5\' 4"  (1.626 m)  Wt 154 lb 3.2 oz (69.945 kg)  BMI 26.46 kg/m2   GEN: Well nourished, well developed, in no acute distress HEENT: normal Neck: no JVD, carotid bruits, or masses Cardiac: RRR; no murmurs, rubs, or gallops,no edema  Respiratory:  clear to auscultation bilaterally, normal work of breathing GI: soft, nontender, nondistended, + BS MS: no deformity or atrophy Skin: warm and dry, no rash Neuro:  Alert and Oriented x 3, Strength and sensation are intact Psych: euthymic mood, full affect  Wt Readings from Last 3 Encounters:  07/03/16 154 lb 3.2 oz (69.945 kg)  12/03/15 155 lb 1.9 oz (70.362 kg)  03/07/15 150 lb 6.4 oz (68.221 kg)      Studies/Labs Reviewed:   EKG:  EKG  Last EKG was performed in December 2016 and revealed sinus rhythm with incomplete right bundle and nonspecific ST-T wave change.  Recent Labs: No results found for requested labs within last 365 days.   Lipid Panel    Component Value Date/Time   CHOL 145 03/07/2015 0911   TRIG 89.0 03/07/2015 0911   HDL 50.40 03/07/2015 0911   CHOLHDL 3 03/07/2015 0911   VLDL 17.8 03/07/2015 0911   LDLCALC 77 03/07/2015 0911    Additional studies/ records that were reviewed today include:  No new data    ASSESSMENT:    1. Coronary artery disease involving native coronary artery of native heart without angina pectoris   2. Hyperlipidemia   3. Essential hypertension      PLAN:  In order of problems listed above:  1. Aerobic activity, lipid control, glycemic control, weight loss, and low salt diet all advocated. She is urged to report any chest symptoms. 2. Low-fat diet and continue statin therapy that is followed by Dr.E.  Avebuere 3. Low-salt diet. We discussed target blood pressure 130/90 mmHg. Aerobic exercise also discussed as a reasonable approach to minimize medication requirement.    Medication Adjustments/Labs and Tests Ordered: Current medicines are reviewed at length with the patient today.  Concerns regarding medicines are outlined above.  Medication changes, Labs and Tests ordered today are listed in the Patient Instructions below. Patient Instructions  Medication Instructions:  Your physician recommends that you continue on your current medications as directed. Please refer to the Current Medication list given to you today.   Labwork: None ordered  Testing/Procedures: None ordered  Follow-Up: Your physician wants you to follow-up  in: 1 year with Dr.Smith You will receive a reminder letter in the mail two months in advance. If you don't receive a letter, please call our office to schedule the follow-up appointment.   Any Other Special Instructions Will Be Listed Below (If Applicable).     If you need a refill on your cardiac medications before your next appointment, please call your pharmacy.       Signed, Lesleigh Noe, MD  07/03/2016 11:08 AM    [facility] [address] [place], [place], [place]  [address] Phone: [phone]; Fax: [phone]

## 2016-07-03 NOTE — Patient Instructions (Signed)

## 2016-08-10 ENCOUNTER — Other Ambulatory Visit: Payer: Self-pay | Admitting: Interventional Cardiology

## 2016-10-29 ENCOUNTER — Other Ambulatory Visit: Payer: Self-pay | Admitting: Physician Assistant

## 2017-05-05 ENCOUNTER — Encounter (INDEPENDENT_AMBULATORY_CARE_PROVIDER_SITE_OTHER): Payer: Medicare HMO | Admitting: Ophthalmology

## 2017-05-05 DIAGNOSIS — H35033 Hypertensive retinopathy, bilateral: Secondary | ICD-10-CM

## 2017-05-05 DIAGNOSIS — I10 Essential (primary) hypertension: Secondary | ICD-10-CM

## 2017-05-05 DIAGNOSIS — H353121 Nonexudative age-related macular degeneration, left eye, early dry stage: Secondary | ICD-10-CM

## 2017-05-05 DIAGNOSIS — E113292 Type 2 diabetes mellitus with mild nonproliferative diabetic retinopathy without macular edema, left eye: Secondary | ICD-10-CM | POA: Diagnosis not present

## 2017-05-05 DIAGNOSIS — E11311 Type 2 diabetes mellitus with unspecified diabetic retinopathy with macular edema: Secondary | ICD-10-CM

## 2017-05-05 DIAGNOSIS — H43813 Vitreous degeneration, bilateral: Secondary | ICD-10-CM

## 2017-05-05 DIAGNOSIS — E113311 Type 2 diabetes mellitus with moderate nonproliferative diabetic retinopathy with macular edema, right eye: Secondary | ICD-10-CM | POA: Diagnosis not present

## 2017-05-25 ENCOUNTER — Emergency Department (HOSPITAL_COMMUNITY)
Admission: EM | Admit: 2017-05-25 | Discharge: 2017-05-25 | Disposition: A | Discharge status: Home / Self Care | Payer: Medicare HMO | Attending: Emergency Medicine | Admitting: Emergency Medicine

## 2017-05-25 ENCOUNTER — Encounter (HOSPITAL_COMMUNITY): Payer: Self-pay | Admitting: Emergency Medicine

## 2017-05-25 ENCOUNTER — Emergency Department (HOSPITAL_COMMUNITY): Payer: Medicare HMO

## 2017-05-25 DIAGNOSIS — Y9301 Activity, walking, marching and hiking: Secondary | ICD-10-CM | POA: Diagnosis not present

## 2017-05-25 DIAGNOSIS — I2510 Atherosclerotic heart disease of native coronary artery without angina pectoris: Secondary | ICD-10-CM | POA: Diagnosis not present

## 2017-05-25 DIAGNOSIS — R55 Syncope and collapse: Secondary | ICD-10-CM

## 2017-05-25 DIAGNOSIS — Y92009 Unspecified place in unspecified non-institutional (private) residence as the place of occurrence of the external cause: Secondary | ICD-10-CM | POA: Insufficient documentation

## 2017-05-25 DIAGNOSIS — S0083XA Contusion of other part of head, initial encounter: Secondary | ICD-10-CM | POA: Insufficient documentation

## 2017-05-25 DIAGNOSIS — Y999 Unspecified external cause status: Secondary | ICD-10-CM | POA: Diagnosis not present

## 2017-05-25 DIAGNOSIS — W1839XA Other fall on same level, initial encounter: Secondary | ICD-10-CM | POA: Insufficient documentation

## 2017-05-25 DIAGNOSIS — I10 Essential (primary) hypertension: Secondary | ICD-10-CM | POA: Diagnosis not present

## 2017-05-25 DIAGNOSIS — Z7984 Long term (current) use of oral hypoglycemic drugs: Secondary | ICD-10-CM | POA: Diagnosis not present

## 2017-05-25 DIAGNOSIS — I252 Old myocardial infarction: Secondary | ICD-10-CM | POA: Diagnosis not present

## 2017-05-25 DIAGNOSIS — Z955 Presence of coronary angioplasty implant and graft: Secondary | ICD-10-CM | POA: Diagnosis not present

## 2017-05-25 DIAGNOSIS — S0990XA Unspecified injury of head, initial encounter: Secondary | ICD-10-CM | POA: Diagnosis present

## 2017-05-25 DIAGNOSIS — Z79899 Other long term (current) drug therapy: Secondary | ICD-10-CM | POA: Insufficient documentation

## 2017-05-25 LAB — CBC WITH DIFFERENTIAL/PLATELET
BASOS PCT: 0 %
Basophils Absolute: 0 10*3/uL (ref 0.0–0.1)
EOS ABS: 0.1 10*3/uL (ref 0.0–0.7)
Eosinophils Relative: 1 %
HEMATOCRIT: 37.5 % (ref 36.0–46.0)
HEMOGLOBIN: 12.1 g/dL (ref 12.0–15.0)
Lymphocytes Relative: 32 %
Lymphs Abs: 3 10*3/uL (ref 0.7–4.0)
MCH: 25.1 pg — ABNORMAL LOW (ref 26.0–34.0)
MCHC: 32.3 g/dL (ref 30.0–36.0)
MCV: 77.8 fL — ABNORMAL LOW (ref 78.0–100.0)
MONOS PCT: 6 %
Monocytes Absolute: 0.6 10*3/uL (ref 0.1–1.0)
NEUTROS ABS: 5.6 10*3/uL (ref 1.7–7.7)
NEUTROS PCT: 61 %
Platelets: 141 10*3/uL — ABNORMAL LOW (ref 150–400)
RBC: 4.82 MIL/uL (ref 3.87–5.11)
RDW: 14.9 % (ref 11.5–15.5)
WBC: 9.3 10*3/uL (ref 4.0–10.5)

## 2017-05-25 LAB — BASIC METABOLIC PANEL
ANION GAP: 10 (ref 5–15)
BUN: 12 mg/dL (ref 6–20)
CALCIUM: 9.3 mg/dL (ref 8.9–10.3)
CHLORIDE: 106 mmol/L (ref 101–111)
CO2: 25 mmol/L (ref 22–32)
CREATININE: 0.94 mg/dL (ref 0.44–1.00)
GFR calc non Af Amer: 58 mL/min — ABNORMAL LOW (ref >60)
Glucose, Bld: 115 mg/dL — ABNORMAL HIGH (ref 65–99)
Potassium: 3.6 mmol/L (ref 3.5–5.1)
SODIUM: 141 mmol/L (ref 135–145)

## 2017-05-25 NOTE — ED Provider Notes (Signed)
[facility]-EMERGENCY DEPT Provider Note   CSN: [id number] Arrival date & time: 05/25/17  0343  By signing my name below, I, Modena Jansky, attest that this documentation has been prepared under the direction and in the presence of Horton, Mayer Masker, MD. Electronically Signed: Modena Jansky, Scribe. 05/25/2017. 3:51 AM.  History   Chief Complaint Chief Complaint  Patient presents with  . Loss of Consciousness  . Head Injury   The history is provided by the patient and a relative. No language interpreter was used.   HPI Comments: [name] is a 76 y.o. female who presents to the Emergency Department complaining of LOC that occurred today. She states she passed out on the way to the bathroom for about a minute. She just woken up and stood up. Thinks she may have hit her head. She also reports right shoulder pain. She reports associated dizziness and dehydration. She has a prior hx of syncope (occurred 4 years ago from hot flashes) and MI (had cardiac stent placed after ~4 years ago). She lives at home with family. Denies any chest pain, SOB, headache, other complaints at this time.   Cardiologist: Katrinka Blazing  Past Medical History:  Diagnosis Date  . Arthritis   . Coronary artery disease    a. Inf STEMI (03/2014) => LHC (03/25/14):  Ostial/prox RI 70-80%, mid RI 95%, mid RCA occluded (culprit).  EF 60% with inferobasal HK.  RI small in distribution and med Rx recommended.  PCI:  Overlapping Xience Alpine (2.5 x 12 mm and 3 x 8 mm) DES to mid RCA.    Marland Kitchen Hyperlipidemia   . Hypertension   . Microcytic anemia   . ST elevation myocardial infarction (STEMI) of inferior wall (HCC) 03/25/2014   DES x2 RCA      Patient Active Problem List   Diagnosis Date Noted  . Constipation 08/13/2014  . Coronary artery disease   . Hyperlipidemia   . Anemia 03/26/2014  . History of acute inferior wall MI 03/25/2014  . Hypertension 03/25/2014  . TOE PAIN 10/27/2007  . DENTAL CARIES 10/12/2007  .  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 CATARACT EXTRACTION    . CORONARY ANGIOPLASTY WITH STENT PLACEMENT  03/25/2014   LAD irreg, RI 80%/95% (med Rx), RCA 100%-->0 w/ overlapping DES x 2, EF OK.  Marland Kitchen LEFT HEART CATH N/A 03/25/2014   Procedure: LEFT HEART CATH;  Surgeon: Lesleigh Noe, MD;  Location: [facility] CATH LAB;  Service: Cardiovascular;  Laterality: N/A;  . PERCUTANEOUS CORONARY STENT INTERVENTION (PCI-S)  03/25/2014   Procedure: PERCUTANEOUS CORONARY STENT INTERVENTION (PCI-S);  Surgeon: Lesleigh Noe, MD;  Location: [facility] CATH LAB;  Service: Cardiovascular;;    OB History    No data available       Home Medications    Prior to Admission medications   Medication Sig Start Date End Date Taking? Authorizing Provider  ASPIRIN LOW DOSE 81 MG EC tablet TAKE 1 TABLET BY MOUTH EVERY DAY 02/18/15   Lyn Records, MD  atorvastatin (LIPITOR) 40 MG tablet TAKE ONE TABLET BY MOUTH ONCE DAILY 10/29/16   Dyann Kief, PA-C  COLCRYS 0.6 MG tablet Take 0.6 mg by mouth daily. 06/22/16   [provider]  hydrochlorothiazide (HYDRODIURIL) 25 MG tablet Take 1 tablet (25 mg total) by mouth daily. 12/03/15   Rosalio Macadamia, NP  hydrOXYzine (ATARAX/VISTARIL) 25 MG tablet Take 25 mg by mouth as needed.  [provider]  latanoprost (XALATAN) 0.005 % ophthalmic solution Place 1 drop into both eyes at bedtime.    [provider]  lisinopril (PRINIVIL,ZESTRIL) 20 MG tablet Take 20 mg by mouth daily. 06/30/16   [provider]  metFORMIN (GLUCOPHAGE) 500 MG tablet Take 500 mg by mouth daily. 06/24/16   [provider]  metoprolol succinate (TOPROL-XL) 50 MG 24 hr tablet TAKE 1 TABLET (50 MG TOTAL) BY MOUTH DAILY. 08/11/16   Lyn Records, MD  nitroGLYCERIN (NITROSTAT) 0.4 MG SL tablet Place 1 tablet (0.4 mg total) under the tongue every 5 (five) minutes as needed for chest pain. 03/27/14   Barrett,  Joline Salt, PA-C  VOLTAREN 1 % GEL Apply topically as directed.  02/01/15   [provider]    Family History Family History  Problem Relation Age of Onset  . Stroke Sister     Social History Social History  Substance Use Topics  . Smoking status: Never Smoker  . Smokeless tobacco: Never Used  . Alcohol use No     Allergies   Imdur [isosorbide dinitrate]   Review of Systems Review of Systems  Constitutional: Negative for fever.  Respiratory: Negative for shortness of breath.   Cardiovascular: Negative for chest pain.  Gastrointestinal: Negative for blood in stool, nausea and vomiting.  Endocrine: Negative for polydipsia.  Genitourinary: Negative for dysuria.  Neurological: Positive for dizziness and syncope. Negative for headaches.  All other systems reviewed and are negative.    Physical Exam Updated Vital Signs BP 119/88 (BP Location: Right Arm)   Pulse 81   Resp (!) 21   Ht 5\' 4"  (1.626 m)   Wt 69.9 kg (154 lb)   SpO2 98%   BMI 26.43 kg/m   Physical Exam  Constitutional: She is oriented to person, place, and time. She appears well-developed and well-nourished. No distress.  HENT:  Head: Normocephalic.  Contusion noted to the right forehead  Eyes: EOM are normal. Pupils are equal, round, and reactive to light.  Cardiovascular: Normal rate, regular rhythm and normal heart sounds.   No murmur heard. Pulmonary/Chest: Effort normal and breath sounds normal. No respiratory distress. She has no wheezes.  Abdominal: Soft. Bowel sounds are normal. There is no tenderness. There is no guarding.  Musculoskeletal:  Normal range of motion of the right shoulder, no obvious deformities  Neurological: She is alert and oriented to person, place, and time.  Cranial nerves II through XII intact, 5 out of 5 strength in all 4 extremities, no dysmetria to finger-nose-finger  Skin: Skin is warm and dry.  Psychiatric: She has a normal mood and affect.  Nursing note and  vitals reviewed.    ED Treatments / Results  DIAGNOSTIC STUDIES: Oxygen Saturation is 100% on RA, normal by my interpretation.    COORDINATION OF CARE: 3:55 AM- Pt advised of plan for treatment and pt agrees.  Labs (all labs ordered are listed, but only abnormal results are displayed) Labs Reviewed  CBC WITH DIFFERENTIAL/PLATELET - Abnormal; Notable for the following:       Result Value   MCV 77.8 (*)    MCH 25.1 (*)    Platelets 141 (*)    All other components within normal limits  BASIC METABOLIC PANEL - Abnormal; Notable for the following:    Glucose, Bld 115 (*)    GFR calc non Af Amer 58 (*)    All other components within normal limits    EKG  EKG Interpretation None  Radiology Dg Shoulder Right  Result Date: 05/25/2017 CLINICAL DATA:  Status post fall, with right lateral shoulder pain. Initial encounter. EXAM: RIGHT SHOULDER - 2+ VIEW COMPARISON:  None. FINDINGS: There is no evidence of fracture or dislocation. The right humeral head is seated within the glenoid fossa. Mild degenerative change is noted at the right acromioclavicular joint. No significant soft tissue abnormalities are seen. The visualized portions of the right lung are clear. IMPRESSION: No evidence of fracture or dislocation. Electronically Signed   By: Roanna Raider M.D.   On: 05/25/2017 05:31   Ct Head Wo Contrast  Result Date: 05/25/2017 CLINICAL DATA:  Syncopal episode while walking to bathroom at 0200 hours. RIGHT forehead hematoma. History of hypertension. EXAM: CT HEAD WITHOUT CONTRAST CT CERVICAL SPINE WITHOUT CONTRAST TECHNIQUE: Multidetector CT imaging of the head and cervical spine was performed following the standard protocol without intravenous contrast. Multiplanar CT image reconstructions of the cervical spine were also generated. COMPARISON:  CT HEAD December 19, 2012 FINDINGS: CT HEAD FINDINGS BRAIN: No intraparenchymal hemorrhage, mass effect nor midline shift. The ventricles and  sulci are normal for age. Patchy supratentorial white matter hypodensities less than expected for patient's age, though non-specific are most compatible with chronic small vessel ischemic disease. No acute large vascular territory infarcts. No abnormal extra-axial fluid collections. Basal cisterns are patent. VASCULAR: Moderate calcific atherosclerosis of the carotid siphons. SKULL: No skull fracture. Small RIGHT frontal scalp hematoma without subcutaneous gas or radiopaque foreign bodies. SINUSES/ORBITS: LEFT anterior ethmoid mucosal retention cyst. Mastoid air cells are well aerated.The included ocular globes and orbital contents are non-suspicious. Status post bilateral ocular lens implants. OTHER: Patient is edentulous. CT CERVICAL SPINE FINDINGS ALIGNMENT: Straightened lordosis. Vertebral bodies in alignment. SKULL BASE AND VERTEBRAE: Cervical vertebral bodies and posterior elements are intact. All mild C6-7 disc height loss with endplate spurring compatible with degenerative discs. Multilevel ventral endplate spurring. See is No destructive bony lesions. C1-2 articulation maintained. SOFT TISSUES AND SPINAL CANAL: Nonacute. Moderate RIGHT and mild LEFT carotid bifurcation calcific atherosclerosis. DISC LEVELS: No significant osseous canal stenosis. Moderate RIGHT C6-7 neural foraminal narrowing. UPPER CHEST: Lung apices are clear. OTHER: None. IMPRESSION: CT HEAD: No acute intracranial process. Small RIGHT frontal scalp hematoma without skull fracture. Negative noncontrast CT HEAD for age. CT CERVICAL SPINE: No acute fracture or malalignment. Electronically Signed   By: Awilda Metro M.D.   On: 05/25/2017 05:11   Ct Cervical Spine Wo Contrast  Result Date: 05/25/2017 CLINICAL DATA:  Syncopal episode while walking to bathroom at 0200 hours. RIGHT forehead hematoma. History of hypertension. EXAM: CT HEAD WITHOUT CONTRAST CT CERVICAL SPINE WITHOUT CONTRAST TECHNIQUE: Multidetector CT imaging of the head  and cervical spine was performed following the standard protocol without intravenous contrast. Multiplanar CT image reconstructions of the cervical spine were also generated. COMPARISON:  CT HEAD December 19, 2012 FINDINGS: CT HEAD FINDINGS BRAIN: No intraparenchymal hemorrhage, mass effect nor midline shift. The ventricles and sulci are normal for age. Patchy supratentorial white matter hypodensities less than expected for patient's age, though non-specific are most compatible with chronic small vessel ischemic disease. No acute large vascular territory infarcts. No abnormal extra-axial fluid collections. Basal cisterns are patent. VASCULAR: Moderate calcific atherosclerosis of the carotid siphons. SKULL: No skull fracture. Small RIGHT frontal scalp hematoma without subcutaneous gas or radiopaque foreign bodies. SINUSES/ORBITS: LEFT anterior ethmoid mucosal retention cyst. Mastoid air cells are well aerated.The included ocular globes and orbital contents are non-suspicious. Status post bilateral ocular lens implants. OTHER:  Patient is edentulous. CT CERVICAL SPINE FINDINGS ALIGNMENT: Straightened lordosis. Vertebral bodies in alignment. SKULL BASE AND VERTEBRAE: Cervical vertebral bodies and posterior elements are intact. All mild C6-7 disc height loss with endplate spurring compatible with degenerative discs. Multilevel ventral endplate spurring. See is No destructive bony lesions. C1-2 articulation maintained. SOFT TISSUES AND SPINAL CANAL: Nonacute. Moderate RIGHT and mild LEFT carotid bifurcation calcific atherosclerosis. DISC LEVELS: No significant osseous canal stenosis. Moderate RIGHT C6-7 neural foraminal narrowing. UPPER CHEST: Lung apices are clear. OTHER: None. IMPRESSION: CT HEAD: No acute intracranial process. Small RIGHT frontal scalp hematoma without skull fracture. Negative noncontrast CT HEAD for age. CT CERVICAL SPINE: No acute fracture or malalignment. Electronically Signed   By: Awilda Metro  M.D.   On: 05/25/2017 05:11    Procedures Procedures (including critical care time)  Medications Ordered in ED Medications - No data to display   Initial Impression / Assessment and Plan / ED Course  I have reviewed the triage vital signs and the nursing notes.  Pertinent labs & imaging results that were available during my care of the patient were reviewed by me and considered in my medical decision making (see chart for details).     Patient presents with syncopal episode. She reports that she got up to go the bathroom and passed out. She did hit her head. History of the same. I have reviewed the patient's chart. She had a similar episode and had a reassuring workup that was thought to be vasovagal in nature. EKG shows no evidence of arrhythmia.  Orthostatics reassuring. Basic labwork obtained and largely reassuring. Imaging including CT head and neck is negative. Patient is able to ambulate without difficulty. Episode sounds vasovagal in nature. Patient states that she feels better and is ready to go home. Follow-up closely with primary physician and cardiologist.  After history, exam, and medical workup I feel the patient has been appropriately medically screened and is safe for discharge home. Pertinent diagnoses were discussed with the patient. Patient was given return precautions.   Final Clinical Impressions(s) / ED Diagnoses   Final diagnoses:  Syncope, unspecified syncope type  Contusion of other part of head, initial encounter    New Prescriptions New Prescriptions   No medications on file   I personally performed the services described in this documentation, which was scribed in my presence. The recorded information has been reviewed and is accurate.     Shon Baton, MD 05/25/17 [phone]

## 2017-05-25 NOTE — ED Notes (Addendum)
Patient transported to CT and xray 

## 2017-05-25 NOTE — Discharge Instructions (Signed)
He was seen today for passing out. Her workup is reassuring. Given that he passed out upon standing up, this may be related to hydration. You need to make sure you remain hydrated.

## 2017-05-25 NOTE — ED Triage Notes (Signed)
Pt in from home after syncope on way to bathroom. Family states pt had LOC x 1 min. Pt reports pain to R forehead, small hematoma present and c/o pain to R shoulder. Pt takes Aspirin q day. VSS on scene, 114/70, CBG 113. Pt arrives to ED a&ox4, able to MAE's equally. Hx of MI with stent placement and syncope 4 yrs ago. Pt is Creole-speaking

## 2017-05-25 NOTE — ED Notes (Signed)
Pt ambulated well in hallway independently. Pt reports no dizziness or lightheadedness. Pt has no complaints at this time.

## 2017-05-26 ENCOUNTER — Encounter: Payer: Self-pay | Admitting: Physician Assistant

## 2017-05-26 ENCOUNTER — Ambulatory Visit (INDEPENDENT_AMBULATORY_CARE_PROVIDER_SITE_OTHER): Payer: Medicare HMO | Admitting: Physician Assistant

## 2017-05-26 ENCOUNTER — Ambulatory Visit
Admission: RE | Admit: 2017-05-26 | Discharge: 2017-05-26 | Disposition: A | Discharge status: Home / Self Care | Payer: Medicare HMO | Source: Ambulatory Visit | Attending: Physician Assistant | Admitting: Physician Assistant

## 2017-05-26 ENCOUNTER — Telehealth: Payer: Self-pay | Admitting: Interventional Cardiology

## 2017-05-26 ENCOUNTER — Telehealth: Payer: Self-pay | Admitting: *Deleted

## 2017-05-26 VITALS — BP 116/58 | HR 62 | Ht 64.0 in | Wt 154.1 lb

## 2017-05-26 DIAGNOSIS — R0789 Other chest pain: Secondary | ICD-10-CM

## 2017-05-26 DIAGNOSIS — I2510 Atherosclerotic heart disease of native coronary artery without angina pectoris: Secondary | ICD-10-CM

## 2017-05-26 DIAGNOSIS — E785 Hyperlipidemia, unspecified: Secondary | ICD-10-CM | POA: Diagnosis not present

## 2017-05-26 DIAGNOSIS — I10 Essential (primary) hypertension: Secondary | ICD-10-CM | POA: Diagnosis not present

## 2017-05-26 DIAGNOSIS — R55 Syncope and collapse: Secondary | ICD-10-CM | POA: Diagnosis not present

## 2017-05-26 LAB — TROPONIN T: Troponin T TROPT: <0.011 ng/mL (ref <0.011)

## 2017-05-26 MED ORDER — PANTOPRAZOLE SODIUM 40 MG PO TBEC: 40.0000 mg | DELAYED_RELEASE_TABLET | Freq: Every day | ORAL | 1 refills | Status: DC

## 2017-05-26 MED ORDER — NITROGLYCERIN 0.4 MG SL SUBL: 0.4000 mg | SUBLINGUAL_TABLET | SUBLINGUAL | 3 refills | Status: DC | PRN

## 2017-05-26 NOTE — Telephone Encounter (Signed)
-----   Message from Beatrice Lecher, [place] sent at 05/26/2017  3:27 PM EDT ----- Please call the patient Troponin is negative. Continue with current treatment plan. Tereso Newcomer, PA-C   05/26/2017 3:27 PM

## 2017-05-26 NOTE — Progress Notes (Signed)
Cardiology Office Note:    Date:  05/26/2017   ID:  [name], DOB [birth year], MRN [id number]  PCP:  Fleet Contras, MD  Cardiologist:  Dr. Verdis Prime    Referring MD: Fleet Contras, MD   Chief Complaint  Patient presents with  . Chest Pain    History of Present Illness:    [name] is a 76 y.o. female with a hx of CAD status post inferior STEMI in 03/2014. She underwent PCI with overlapping DES to the mid RCA. She had residual stenosis with proximal and mid disease in the ramus intermedius with a small distribution. This was treated medically.  She was last seen by Dr. Katrinka Blazing 06/2016. She presented to the emergency room yesterday with a syncopal episode. ECG did not demonstrate any evidence of injury. Head CT was unremarkable. She called the office today with complaints of chest pain starting this morning.  [name] presents for evaluation of chest pain.  She is here alone today. 3 days ago, she awoke in the middle of the night to urinate. She had to rush to the bathroom and started to urinate on herself. Shortly after this, she lost consciousness. Her son-in-law was with her and started CPR. She awoke when CPR was begun. It does not sound as though her son-in-law checked for a pulse before starting CPR. She did hit her head. CT in the emergency room was negative for acute findings. She did eat a meal late last night. She awoke today with chest discomfort that she describes as indigestion. Her substernal chest discomfort lasted about 3 hours. She now has interscapular back pain. She has not taken nitroglycerin. Her symptoms are not like her previous angina. She denies associated dyspnea, nausea, diaphoresis. She denies pleuritic chest pain or chest discomfort lying supine. She denies orthopnea, PND or edema. She denies any ripping or tearing sensation.  Prior CV studies:   The following studies were reviewed today:  LHC (03/25/14):  Ostial/prox RI 70-80%, mid RI 95% mid RCA  occluded (culprit).  EF 60% with inferobasal HK. RI small in distribution and med Rx recommended.  PCI: Overlapping Xience Alpine (2.5 x 12 mm and 3 x 8 mm) DES to mid RCA.   Past Medical History:  Diagnosis Date  . Arthritis   . Coronary artery disease    a. Inf STEMI (03/2014) => LHC (03/25/14):  Ostial/prox RI 70-80%, mid RI 95%, mid RCA occluded (culprit).  EF 60% with inferobasal HK.  RI small in distribution and med Rx recommended.  PCI:  Overlapping Xience Alpine (2.5 x 12 mm and 3 x 8 mm) DES to mid RCA.    Marland Kitchen Hyperlipidemia   . Hypertension   . Microcytic anemia   . ST elevation myocardial infarction (STEMI) of inferior wall (HCC) 03/25/2014   DES x2 RCA      Past Surgical History:  Procedure Laterality Date  . CATARACT EXTRACTION    . CORONARY ANGIOPLASTY WITH STENT PLACEMENT  03/25/2014   LAD irreg, RI 80%/95% (med Rx), RCA 100%-->0 w/ overlapping DES x 2, EF OK.  Marland Kitchen LEFT HEART CATH N/A 03/25/2014   Procedure: LEFT HEART CATH;  Surgeon: Lesleigh Noe, MD;  Location: [facility] CATH LAB;  Service: Cardiovascular;  Laterality: N/A;  . PERCUTANEOUS CORONARY STENT INTERVENTION (PCI-S)  03/25/2014   Procedure: PERCUTANEOUS CORONARY STENT INTERVENTION (PCI-S);  Surgeon: Lesleigh Noe, MD;  Location: [facility] CATH LAB;  Service: Cardiovascular;;    Current Medications: Current Meds  Medication Sig  . ASPIRIN LOW DOSE 81 MG EC tablet TAKE 1 TABLET BY MOUTH EVERY DAY  . atorvastatin (LIPITOR) 40 MG tablet TAKE ONE TABLET BY MOUTH ONCE DAILY  . COLCRYS 0.6 MG tablet Take 0.6 mg by mouth daily.  . hydrochlorothiazide (HYDRODIURIL) 25 MG tablet Take 1 tablet (25 mg total) by mouth daily.  . hydrOXYzine (ATARAX/VISTARIL) 25 MG tablet Take 25 mg by mouth as needed.  . latanoprost (XALATAN) 0.005 % ophthalmic solution Place 1 drop into both eyes at bedtime.  Marland Kitchen lisinopril (PRINIVIL,ZESTRIL) 20 MG tablet Take 20 mg by mouth daily.  . metFORMIN (GLUCOPHAGE) 500 MG tablet Take 500 mg by mouth  daily.  . metoprolol succinate (TOPROL-XL) 50 MG 24 hr tablet TAKE 1 TABLET (50 MG TOTAL) BY MOUTH DAILY.  . nitroGLYCERIN (NITROSTAT) 0.4 MG SL tablet Place 1 tablet (0.4 mg total) under the tongue every 5 (five) minutes as needed for chest pain.  Marland Kitchen VOLTAREN 1 % GEL Apply topically as directed.   . [DISCONTINUED] nitroGLYCERIN (NITROSTAT) 0.4 MG SL tablet Place 1 tablet (0.4 mg total) under the tongue every 5 (five) minutes as needed for chest pain.     Allergies:   Imdur [isosorbide dinitrate]   Social History   Social History  . Marital status: Single    Spouse name: N/A  . Number of children: N/A  . Years of education: N/A   Social History Main Topics  . Smoking status: Never Smoker  . Smokeless tobacco: Never Used  . Alcohol use No  . Drug use: No  . Sexual activity: Not Currently   Other Topics Concern  . None   Social History Narrative  . None     Family Hx: The patient's family history includes Stroke in her sister.  ROS:   Please see the history of present illness.    Review of Systems  Eyes: Positive for visual disturbance.  Cardiovascular: Positive for chest pain, irregular heartbeat and syncope.  Musculoskeletal: Positive for joint swelling.  Neurological: Positive for loss of balance.   All other systems reviewed and are negative.   EKGs/Labs/Other Test Reviewed:    EKG:  EKG is  ordered today.  The ekg ordered today demonstrates NSR, HR 62, normal axis, incomplete RBBB, QTc 418 ms, no acute ST-TW changes  Recent Labs: 05/25/2017: BUN 12; Creatinine, Ser 0.94; Hemoglobin 12.1; Platelets 141; Potassium 3.6; Sodium 141   Recent Lipid Panel Lab Results  Component Value Date/Time   CHOL 145 03/07/2015 09:11 AM   TRIG 89.0 03/07/2015 09:11 AM   HDL 50.40 03/07/2015 09:11 AM   CHOLHDL 3 03/07/2015 09:11 AM   LDLCALC 77 03/07/2015 09:11 AM    Physical Exam:    VS:  BP (!) 116/58   Pulse 62   Ht 5\' 4"  (1.626 m)   Wt 154 lb 1.9 oz (69.9 kg)   BMI  26.45 kg/m     BP R arm - 114/60 BP L arm - 112/60  Wt Readings from Last 3 Encounters:  05/26/17 154 lb 1.9 oz (69.9 kg)  05/25/17 154 lb (69.9 kg)  07/03/16 154 lb 3.2 oz (69.9 kg)     Physical Exam  Constitutional: She is oriented to person, place, and time. She appears well-developed and well-nourished. No distress.  HENT:  Head: Normocephalic and atraumatic.  Eyes: No scleral icterus.  Neck: Normal range of motion. Neck supple. No JVD present.  Cardiovascular: Normal rate, regular rhythm, S1 normal, S2 normal and normal heart  sounds.  Exam reveals no friction rub.   No murmur heard. Radial pulses equal bilaterally  Pulmonary/Chest: Breath sounds normal. She has no wheezes. She has no rhonchi. She has no rales.  Abdominal: Soft. There is no tenderness.  Musculoskeletal: She exhibits edema (trace bilateral ankle edema).  Neurological: She is alert and oriented to person, place, and time.  Skin: Skin is warm and dry.  Psychiatric: She has a normal mood and affect.    ASSESSMENT:    1. Other chest pain   2. Syncope, unspecified syncope type   3. Coronary artery disease involving native coronary artery of native heart without angina pectoris   4. Essential hypertension   5. Hyperlipidemia, unspecified hyperlipidemia type    PLAN:    In order of problems listed above:  1. Other chest pain -  She presents with complaints of chest discomfort that began this morning. Her ECG does not demonstrate any acute findings. She feels as though it is related to indigestion. Blood pressures in both arms are equal. Her symptoms are not like her previous angina. She is not tachycardic. Of note, she did have a syncopal episode at the time of her myocardial infarction in 2015.  -  Protonix 40 mg daily 1 week, then when necessary  -  Stat troponin  -  Send to emergency room if troponin abnormal  -  Arrange Lexiscan Myoview  -  Arrange chest x-ray  2. Syncope, unspecified syncope type  - Symptoms several days ago sound consistent with post micturition syncope. EF has been normal in the past. As noted, she will undergo stress testing.  3. Coronary artery disease involving native coronary artery of native heart without angina pectoris -  Prior history of inferior STEMI treated with DES 2 to the RCA. She has residual disease in a small ramus intermedius. Stress testing will be arranged given recent history of chest pain. Continue aspirin, statin, beta blocker.  4. Essential hypertension - The patient's blood pressure is controlled on her current regimen.  Continue current therapy.    5. Hyperlipidemia, unspecified hyperlipidemia type - Continue statin.    Dispo:  Return in about 6 weeks (around 07/05/2017) for Scheduled Follow Up w/ Dr. Katrinka Blazing.   Medication Adjustments/Labs and Tests Ordered: Current medicines are reviewed at length with the patient today.  Concerns regarding medicines are outlined above.  Orders/Tests:  Orders Placed This Encounter  Procedures  . DG Chest 2 View  . Troponin T  . Myocardial Perfusion Imaging  . EKG 12-Lead   Medication changes: Meds ordered this encounter  Medications  . pantoprazole (PROTONIX) 40 MG tablet    Sig: Take 1 tablet (40 mg total) by mouth daily. Take 40 mg daily for 1 week then take as needed    Dispense:  30 tablet    Refill:  1  . nitroGLYCERIN (NITROSTAT) 0.4 MG SL tablet    Sig: Place 1 tablet (0.4 mg total) under the tongue every 5 (five) minutes as needed for chest pain.    Dispense:  25 tablet    Refill:  3   Signed, Tereso Newcomer, PA-C  05/26/2017 4:36 PM    [facility] [address] [place], [place], [place]  [address] Phone: [phone]; Fax: [phone]

## 2017-05-26 NOTE — Telephone Encounter (Signed)
Reviewed patient's concerns with Tereso Newcomer, PA to determine if she is appropriate for appointment with him at 11:45 today. He agreed and asked me to verify with Dr. Katrinka Blazing, who is in the office today. Dr. Katrinka Blazing is in agreement with appointment today as long as patient's pain is not severe. I called patient and she denies that pain is severe. She states this pain is different from her pain with previous MI. I advised her to come today for appointment with Tereso Newcomer, PA at 11:45. She verbalized understanding and agreement and thanked me for the call.

## 2017-05-26 NOTE — Telephone Encounter (Signed)
New message     Pt c/o of Chest Pain: STAT if CP now or developed within 24 hours  1. Are you having CP right now? Not now but had chest/back pain this am 2. Are you experiencing any other symptoms (ex. SOB, nausea, vomiting, sweating)?  Not now but on Monday, pt went to ER for a syncope episode 3. How long have you been experiencing CP?  Started this am  4. Is your CP continuous or coming and going?   5. Have you taken Nitroglycerin?  no ?

## 2017-05-26 NOTE — Telephone Encounter (Signed)
Spoke with patient who c/o chest and back pain that started this morning when she woke up around 0500. Denies SOB, n/v, diaphoresis; rates pain 7 on 0-10 scale with 10 being worst pain. She states she does not have NTG; verifies that she is taking the other cardiac medications. She does not monitor her HR or BP at home.  States pain is same with movement; sudden onset; denies recent progression of symptoms or pain/discomfort with exertion. Patient was evaluated on Monday at [facility] ER for syncope. She states she was told everything looked okay and was discharged home. She has an appointment with Dr. Katrinka Blazing July 23 but is requesting evaluation today. I advised that I will discuss with APP who has opening today and will call her back with his advice. She verbalized understanding and agreement and thanked me for the call.

## 2017-05-26 NOTE — Telephone Encounter (Signed)
Pt has been notified of negative STAT Troponin T as well as CXR results ok. Pt verbalized with verbal understanding of the results given today by phone. Pt thanked me for my call today.

## 2017-05-26 NOTE — Patient Instructions (Signed)
Medication Instructions:  1. START PROTONIX 40 MG DAILY FOR 1 WEEK THEN TAKE AS NEEDED  2. REFILL WAS SENT IN FOR NITROGLYCERIN  Labwork: 1. STAT TROPONIN T   Testing/Procedures: 1. Your physician has requested that you have a lexiscan myoview. For further information please visit [email or web address] Please follow instruction sheet, as given.  2. A chest x-ray takes a picture of the organs and structures inside the chest, including the heart, lungs, and blood vessels. This test can show several things, including, whether the heart is enlarges; whether fluid is building up in the lungs; and whether pacemaker / defibrillator leads are still in place. THIS IS TO BE DONE AT [address] [place] [facility]   Follow-Up: SCOTT WEAVER, PAC 3-4 WEEKS SAME DAY DR. Katrinka Blazing IS IN THE OFFICE  Any Other Special Instructions Will Be Listed Below (If Applicable).     If you need a refill on your cardiac medications before your next appointment, please call your pharmacy.

## 2017-05-27 ENCOUNTER — Telehealth (HOSPITAL_COMMUNITY): Payer: Self-pay | Admitting: *Deleted

## 2017-05-27 NOTE — Telephone Encounter (Signed)
Left message on voicemail in reference to upcoming appointment scheduled for 06/01/17. Phone number given for a call back so details instructions can be given.  [name]    

## 2017-05-31 ENCOUNTER — Telehealth (HOSPITAL_COMMUNITY): Payer: Self-pay | Admitting: *Deleted

## 2017-05-31 NOTE — Telephone Encounter (Signed)
Patient given detailed instructions per Myocardial Perfusion Study Information Sheet for the test on 06/01/17. Patient notified to arrive 15 minutes early and that it is imperative to arrive on time for appointment to keep from having the test rescheduled.  If you need to cancel or reschedule your appointment, please call the office within 24 hours of your appointment. . Patient verbalized understanding. By  Allen Kell

## 2017-06-01 ENCOUNTER — Telehealth: Payer: Self-pay | Admitting: *Deleted

## 2017-06-01 ENCOUNTER — Encounter: Payer: Self-pay | Admitting: Physician Assistant

## 2017-06-01 ENCOUNTER — Ambulatory Visit (HOSPITAL_COMMUNITY): Payer: Medicare HMO | Attending: Cardiology

## 2017-06-01 DIAGNOSIS — R0789 Other chest pain: Secondary | ICD-10-CM | POA: Insufficient documentation

## 2017-06-01 LAB — MYOCARDIAL PERFUSION IMAGING
CHL CUP NUCLEAR SRS: 8
CHL CUP NUCLEAR SSS: 8
LHR: 0.24
LV sys vol: 29 mL
LVDIAVOL: 57 mL (ref 46–106)
Peak HR: 85 {beats}/min
Rest HR: 60 {beats}/min
SDS: 0
TID: 1

## 2017-06-01 MED ORDER — TECHNETIUM TC 99M TETROFOSMIN IV KIT
32.8000 | PACK | Freq: Once | INTRAVENOUS | Status: AC | PRN
  Administered 2017-06-01: 32.8 via INTRAVENOUS
  Filled 2017-06-01: qty 33

## 2017-06-01 MED ORDER — TECHNETIUM TC 99M TETROFOSMIN IV KIT
10.6000 | PACK | Freq: Once | INTRAVENOUS | Status: AC | PRN
  Administered 2017-06-01: 10.6 via INTRAVENOUS
  Filled 2017-06-01: qty 11

## 2017-06-01 MED ORDER — REGADENOSON 0.4 MG/5ML IV SOLN
0.4000 mg | Freq: Once | INTRAVENOUS | Status: AC
  Administered 2017-06-01: 0.4 mg via INTRAVENOUS

## 2017-06-01 NOTE — Telephone Encounter (Signed)
-----   Message from Beatrice Lecher, [place] sent at 06/01/2017  4:35 PM EDT ----- Please call the patient. The Nuclear stress test shows normal blood flow. Continue current medications and follow up as planned.  Please fax a copy of this study result to her PCP:  Fleet Contras, MD  Thanks! Tereso Newcomer, PA-C    06/01/2017 4:34 PM

## 2017-06-01 NOTE — Telephone Encounter (Signed)
Pt has been notified of Myoview results by phone with verbal understanding. Pt asked for me to also please give the results to her son-in-law, who has not been notified of Myoview results as well. Both the pt and her son-in-law thanked me for my call and the good news. I will fax a copy of results to PCP.

## 2017-06-02 ENCOUNTER — Encounter (INDEPENDENT_AMBULATORY_CARE_PROVIDER_SITE_OTHER): Payer: Medicare HMO | Admitting: Ophthalmology

## 2017-06-02 DIAGNOSIS — E113311 Type 2 diabetes mellitus with moderate nonproliferative diabetic retinopathy with macular edema, right eye: Secondary | ICD-10-CM

## 2017-06-02 DIAGNOSIS — E11311 Type 2 diabetes mellitus with unspecified diabetic retinopathy with macular edema: Secondary | ICD-10-CM | POA: Diagnosis not present

## 2017-06-02 DIAGNOSIS — I10 Essential (primary) hypertension: Secondary | ICD-10-CM | POA: Diagnosis not present

## 2017-06-02 DIAGNOSIS — H353121 Nonexudative age-related macular degeneration, left eye, early dry stage: Secondary | ICD-10-CM

## 2017-06-02 DIAGNOSIS — E113292 Type 2 diabetes mellitus with mild nonproliferative diabetic retinopathy without macular edema, left eye: Secondary | ICD-10-CM

## 2017-06-02 DIAGNOSIS — H35033 Hypertensive retinopathy, bilateral: Secondary | ICD-10-CM

## 2017-06-30 ENCOUNTER — Encounter (INDEPENDENT_AMBULATORY_CARE_PROVIDER_SITE_OTHER): Payer: Medicare HMO | Admitting: Ophthalmology

## 2017-06-30 DIAGNOSIS — E11311 Type 2 diabetes mellitus with unspecified diabetic retinopathy with macular edema: Secondary | ICD-10-CM

## 2017-06-30 DIAGNOSIS — H353121 Nonexudative age-related macular degeneration, left eye, early dry stage: Secondary | ICD-10-CM | POA: Diagnosis not present

## 2017-06-30 DIAGNOSIS — E113311 Type 2 diabetes mellitus with moderate nonproliferative diabetic retinopathy with macular edema, right eye: Secondary | ICD-10-CM

## 2017-06-30 DIAGNOSIS — I10 Essential (primary) hypertension: Secondary | ICD-10-CM | POA: Diagnosis not present

## 2017-06-30 DIAGNOSIS — H35033 Hypertensive retinopathy, bilateral: Secondary | ICD-10-CM | POA: Diagnosis not present

## 2017-06-30 DIAGNOSIS — E113292 Type 2 diabetes mellitus with mild nonproliferative diabetic retinopathy without macular edema, left eye: Secondary | ICD-10-CM | POA: Diagnosis not present

## 2017-07-05 ENCOUNTER — Other Ambulatory Visit: Payer: Self-pay | Admitting: Interventional Cardiology

## 2017-07-05 ENCOUNTER — Ambulatory Visit: Payer: Medicare HMO | Admitting: Interventional Cardiology

## 2017-08-01 NOTE — Progress Notes (Signed)
Cardiology Office Note    Date:  08/02/2017   ID:  Zimal, Weisensel 09/23/1941, MRN [id number]  PCP:  Fleet Contras, MD  Cardiologist: Lesleigh Noe, MD   Chief Complaint  Patient presents with  . Coronary Artery Disease    History of Present Illness:  [name] is a 76 y.o. female with a hx of CAD status post inferior STEMI in 03/2014. She underwent PCI with overlapping DES to the mid RCA. She had residual stenosis with proximal and mid disease in the ramus intermedius with a small distribution. This was treated medically.  She was last seen by Dr. Katrinka Blazing 06/2016. She had a low risk myocardial perfusion study performed earlier this summer, June 2018  Overall doing well. Had post micturition syncope earlier this year. Workup included a myocardial perfusion study which was low risk. Please see below. She has not needed nitroglycerin. No recurrent syncope. She denies dyspnea.  Past Medical History:  Diagnosis Date  . Arthritis   . Coronary artery disease    a. Inf STEMI (03/2014) => LHC (03/25/14):  Ostial/prox RI 70-80%, mid RI 95%, mid RCA occluded (culprit).  EF 60% with inferobasal HK.  RI small in distribution and med Rx recommended.  PCI:  Overlapping Xience Alpine (2.5 x 12 mm and 3 x 8 mm) DES to mid RCA. // b. Myoview 6/18: EF 50, no ischemia; Low Risk   . Hyperlipidemia   . Hypertension   . Microcytic anemia   . ST elevation myocardial infarction (STEMI) of inferior wall (HCC) 03/25/2014   DES x2 RCA      Past Surgical History:  Procedure Laterality Date  . CATARACT EXTRACTION    . CORONARY ANGIOPLASTY WITH STENT PLACEMENT  03/25/2014   LAD irreg, RI 80%/95% (med Rx), RCA 100%-->0 w/ overlapping DES x 2, EF OK.  Marland Kitchen LEFT HEART CATH N/A 03/25/2014   Procedure: LEFT HEART CATH;  Surgeon: Lesleigh Noe, MD;  Location: [facility] CATH LAB;  Service: Cardiovascular;  Laterality: N/A;  . PERCUTANEOUS CORONARY STENT INTERVENTION (PCI-S)  03/25/2014   Procedure: PERCUTANEOUS  CORONARY STENT INTERVENTION (PCI-S);  Surgeon: Lesleigh Noe, MD;  Location: [facility] CATH LAB;  Service: Cardiovascular;;    Current Medications: Outpatient Medications Prior to Visit  Medication Sig Dispense Refill  . ASPIRIN LOW DOSE 81 MG EC tablet TAKE 1 TABLET BY MOUTH EVERY DAY 30 tablet 0  . atorvastatin (LIPITOR) 40 MG tablet Take 1 tablet (40 mg total) by mouth daily. 90 tablet 3  . COLCRYS 0.6 MG tablet Take 0.6 mg by mouth daily.    . hydrochlorothiazide (HYDRODIURIL) 25 MG tablet Take 1 tablet (25 mg total) by mouth daily. 90 tablet 3  . hydrOXYzine (ATARAX/VISTARIL) 25 MG tablet Take 25 mg by mouth as needed for itching.     . latanoprost (XALATAN) 0.005 % ophthalmic solution Place 1 drop into both eyes at bedtime.    Marland Kitchen lisinopril (PRINIVIL,ZESTRIL) 20 MG tablet Take 20 mg by mouth daily.    . metFORMIN (GLUCOPHAGE) 500 MG tablet Take 500 mg by mouth daily.    . metoprolol succinate (TOPROL-XL) 50 MG 24 hr tablet TAKE 1 TABLET (50 MG TOTAL) BY MOUTH DAILY. 90 tablet 3  . nitroGLYCERIN (NITROSTAT) 0.4 MG SL tablet Place 1 tablet (0.4 mg total) under the tongue every 5 (five) minutes as needed for chest pain. 25 tablet 3  . VOLTAREN 1 % GEL Apply topically as directed.     Marland Kitchen  pantoprazole (PROTONIX) 40 MG tablet Take 1 tablet (40 mg total) by mouth daily. Take 40 mg daily for 1 week then take as needed (Patient not taking: Reported on 08/02/2017) 30 tablet 1   No facility-administered medications prior to visit.      Allergies:   Patient has no active allergies.   Social History   Social History  . Marital status: Single    Spouse name: N/A  . Number of children: N/A  . Years of education: N/A   Social History Main Topics  . Smoking status: Never Smoker  . Smokeless tobacco: Never Used  . Alcohol use No  . Drug use: No  . Sexual activity: Not Currently   Other Topics Concern  . None   Social History Narrative  . None     Family History:  The patient's family  history includes Stroke in her sister.   ROS:   Please see the history of present illness.    No vision disturbance. There is mild shortness of breath with activity. Some constipation. No leg discomfort.  All other systems reviewed and are negative.   PHYSICAL EXAM:   VS:  BP 114/70 (BP Location: Left Arm)   Pulse 83   Ht 5\' 4"  (1.626 m)   Wt 153 lb 6.4 oz (69.6 kg)   BMI 26.33 kg/m    GEN: Well nourished, well developed, in no acute distress  HEENT: normal  Neck: no JVD, carotid bruits, or masses Cardiac: RRR; no murmurs, rubs, or gallops,no edema  Respiratory:  clear to auscultation bilaterally, normal work of breathing GI: soft, nontender, nondistended, + BS MS: no deformity or atrophy  Skin: warm and dry, no rash Neuro:  Alert and Oriented x 3, Strength and sensation are intact Psych: euthymic mood, full affect  Wt Readings from Last 3 Encounters:  08/02/17 153 lb 6.4 oz (69.6 kg)  06/01/17 154 lb (69.9 kg)  05/26/17 154 lb 1.9 oz (69.9 kg)      Studies/Labs Reviewed:   EKG:  EKG  None  Recent Labs: 05/25/2017: BUN 12; Creatinine, Ser 0.94; Hemoglobin 12.1; Platelets 141; Potassium 3.6; Sodium 141   Lipid Panel    Component Value Date/Time   CHOL 145 03/07/2015 0911   TRIG 89.0 03/07/2015 0911   HDL 50.40 03/07/2015 0911   CHOLHDL 3 03/07/2015 0911   VLDL 17.8 03/07/2015 0911   LDLCALC 77 03/07/2015 0911    Additional studies/ records that were reviewed today include:  Stress myocardial perfusion imaging: Study Highlights    Nuclear stress EF: 50%.  There was no ST segment deviation noted during stress.  The study is normal. No ischemia.  This is a low risk study.  The left ventricular ejection fraction is mildly decreased (45-54%).      ASSESSMENT:    1. Coronary artery disease involving native coronary artery of native heart without angina pectoris   2. Essential hypertension   3. Other hyperlipidemia   4. Incomplete right bundle branch  block   5. History of acute inferior wall MI   6. Micturition syncope      PLAN:  In order of problems listed above:  1. Low risk myocardial perfusion study. No further cardiac evaluation at this time. 2. Excellent blood pressure control. 3. LDL target 70 or less. When last tested in March, LDL was 77. Medications have not been changed. 4. Not addressed 5. No evidence of volume overload or heart failure. 6. No recurrence.   Clinical follow-up in  one year. Continue medications as listed. Risk factor modification as tolerated. Lipids are followed by primary care.    Medication Adjustments/Labs and Tests Ordered: Current medicines are reviewed at length with the patient today.  Concerns regarding medicines are outlined above.  Medication changes, Labs and Tests ordered today are listed in the Patient Instructions below. Patient Instructions  Medication Instructions:  None  Labwork: None  Testing/Procedures: None  Follow-Up: Your physician wants you to follow-up in: 1 year with Dr. Katrinka Blazing.  You will receive a reminder letter in the mail two months in advance. If you don't receive a letter, please call our office to schedule the follow-up appointment.   Any Other Special Instructions Will Be Listed Below (If Applicable).     If you need a refill on your cardiac medications before your next appointment, please call your pharmacy.      Signed, Lesleigh Noe, MD  08/02/2017 1:58 PM    [facility] [address] [place], [place], [place]  [address] Phone: [phone]; Fax: [phone]

## 2017-08-02 ENCOUNTER — Ambulatory Visit (INDEPENDENT_AMBULATORY_CARE_PROVIDER_SITE_OTHER): Payer: Medicare HMO | Admitting: Interventional Cardiology

## 2017-08-02 ENCOUNTER — Encounter (INDEPENDENT_AMBULATORY_CARE_PROVIDER_SITE_OTHER): Payer: Self-pay

## 2017-08-02 ENCOUNTER — Encounter: Payer: Self-pay | Admitting: Interventional Cardiology

## 2017-08-02 VITALS — BP 114/70 | HR 83 | Ht 64.0 in | Wt 153.4 lb

## 2017-08-02 DIAGNOSIS — I2510 Atherosclerotic heart disease of native coronary artery without angina pectoris: Secondary | ICD-10-CM

## 2017-08-02 DIAGNOSIS — I4510 Unspecified right bundle-branch block: Secondary | ICD-10-CM | POA: Diagnosis not present

## 2017-08-02 DIAGNOSIS — R55 Syncope and collapse: Secondary | ICD-10-CM

## 2017-08-02 DIAGNOSIS — E784 Other hyperlipidemia: Secondary | ICD-10-CM | POA: Diagnosis not present

## 2017-08-02 DIAGNOSIS — I10 Essential (primary) hypertension: Secondary | ICD-10-CM | POA: Diagnosis not present

## 2017-08-02 DIAGNOSIS — E7849 Other hyperlipidemia: Secondary | ICD-10-CM

## 2017-08-02 DIAGNOSIS — I252 Old myocardial infarction: Secondary | ICD-10-CM

## 2017-08-02 NOTE — Patient Instructions (Signed)

## 2017-08-11 ENCOUNTER — Encounter (INDEPENDENT_AMBULATORY_CARE_PROVIDER_SITE_OTHER): Payer: Medicare HMO | Admitting: Ophthalmology

## 2017-08-11 DIAGNOSIS — E113392 Type 2 diabetes mellitus with moderate nonproliferative diabetic retinopathy without macular edema, left eye: Secondary | ICD-10-CM | POA: Diagnosis not present

## 2017-08-11 DIAGNOSIS — E11311 Type 2 diabetes mellitus with unspecified diabetic retinopathy with macular edema: Secondary | ICD-10-CM

## 2017-08-11 DIAGNOSIS — I10 Essential (primary) hypertension: Secondary | ICD-10-CM | POA: Diagnosis not present

## 2017-08-11 DIAGNOSIS — E113311 Type 2 diabetes mellitus with moderate nonproliferative diabetic retinopathy with macular edema, right eye: Secondary | ICD-10-CM | POA: Diagnosis not present

## 2017-08-11 DIAGNOSIS — H35033 Hypertensive retinopathy, bilateral: Secondary | ICD-10-CM

## 2017-08-11 DIAGNOSIS — H43813 Vitreous degeneration, bilateral: Secondary | ICD-10-CM

## 2017-08-31 ENCOUNTER — Other Ambulatory Visit: Payer: Self-pay | Admitting: Physician Assistant

## 2017-09-04 IMAGING — NM NM MISC PROCEDURE
3 series · 18 of 18 positions shown · non-contrast
Comparison: none

[Series 1: wbr_s-proj_st stress_(id)_sa · 6.5mm · 6.51mm/px · 6 of 512 frames shown (1 of 2)]
[frame 43/512]
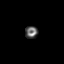
[frame 128/512]
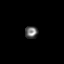
[frame 214/512]
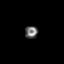
[frame 299/512]
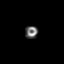
[frame 384/512]
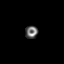
[frame 470/512]
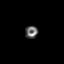

[Series 1: wbr_r-proj_st rest_(id)_sa · 6.5mm · 6.51mm/px · 6 of 64 frames shown]
[frame 6/64]
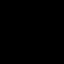
[frame 16/64]
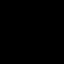
[frame 27/64]
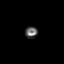
[frame 38/64]
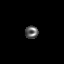
[frame 48/64]
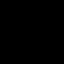
[frame 59/64]
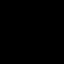

[Series 1: wbr_s-proj_st stress_(id)_sa · 6.5mm · 6.51mm/px · 6 of 64 frames shown (2 of 2)]
[frame 6/64]
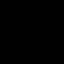
[frame 16/64]
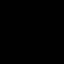
[frame 27/64]
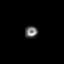
[frame 38/64]
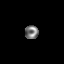
[frame 48/64]
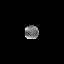
[frame 59/64]
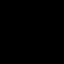

[18 of 18 positions shown; findings below may reference images not displayed]

Canned report from images found in remote index.

Refer to host system for actual result text.

## 2017-09-22 ENCOUNTER — Encounter (INDEPENDENT_AMBULATORY_CARE_PROVIDER_SITE_OTHER): Payer: Self-pay | Admitting: Ophthalmology

## 2017-09-30 ENCOUNTER — Encounter (INDEPENDENT_AMBULATORY_CARE_PROVIDER_SITE_OTHER): Payer: Medicare HMO | Admitting: Ophthalmology

## 2017-09-30 DIAGNOSIS — E11311 Type 2 diabetes mellitus with unspecified diabetic retinopathy with macular edema: Secondary | ICD-10-CM

## 2017-09-30 DIAGNOSIS — I10 Essential (primary) hypertension: Secondary | ICD-10-CM

## 2017-09-30 DIAGNOSIS — E113311 Type 2 diabetes mellitus with moderate nonproliferative diabetic retinopathy with macular edema, right eye: Secondary | ICD-10-CM

## 2017-09-30 DIAGNOSIS — E113292 Type 2 diabetes mellitus with mild nonproliferative diabetic retinopathy without macular edema, left eye: Secondary | ICD-10-CM | POA: Diagnosis not present

## 2017-09-30 DIAGNOSIS — H43813 Vitreous degeneration, bilateral: Secondary | ICD-10-CM

## 2017-09-30 DIAGNOSIS — H35033 Hypertensive retinopathy, bilateral: Secondary | ICD-10-CM

## 2017-10-05 ENCOUNTER — Other Ambulatory Visit: Payer: Self-pay | Admitting: Interventional Cardiology

## 2017-10-05 MED ORDER — ATORVASTATIN CALCIUM 40 MG PO TABS
40.0000 mg | ORAL_TABLET | Freq: Every day | ORAL | 2 refills | Status: DC
Start: 2017-10-05 — End: 2019-10-27

## 2017-11-01 ENCOUNTER — Other Ambulatory Visit: Payer: Self-pay | Admitting: Internal Medicine

## 2017-11-01 DIAGNOSIS — E2839 Other primary ovarian failure: Secondary | ICD-10-CM

## 2017-11-03 ENCOUNTER — Encounter (INDEPENDENT_AMBULATORY_CARE_PROVIDER_SITE_OTHER): Payer: Medicare HMO | Admitting: Ophthalmology

## 2017-11-03 DIAGNOSIS — E113311 Type 2 diabetes mellitus with moderate nonproliferative diabetic retinopathy with macular edema, right eye: Secondary | ICD-10-CM

## 2017-11-03 DIAGNOSIS — E11311 Type 2 diabetes mellitus with unspecified diabetic retinopathy with macular edema: Secondary | ICD-10-CM | POA: Diagnosis not present

## 2017-11-03 DIAGNOSIS — H35033 Hypertensive retinopathy, bilateral: Secondary | ICD-10-CM | POA: Diagnosis not present

## 2017-11-03 DIAGNOSIS — I10 Essential (primary) hypertension: Secondary | ICD-10-CM | POA: Diagnosis not present

## 2017-11-03 DIAGNOSIS — H43813 Vitreous degeneration, bilateral: Secondary | ICD-10-CM

## 2017-11-03 DIAGNOSIS — E113292 Type 2 diabetes mellitus with mild nonproliferative diabetic retinopathy without macular edema, left eye: Secondary | ICD-10-CM | POA: Diagnosis not present

## 2017-11-11 ENCOUNTER — Encounter (INDEPENDENT_AMBULATORY_CARE_PROVIDER_SITE_OTHER): Payer: Self-pay | Admitting: Ophthalmology

## 2017-12-01 ENCOUNTER — Encounter (INDEPENDENT_AMBULATORY_CARE_PROVIDER_SITE_OTHER): Payer: Medicare HMO | Admitting: Ophthalmology

## 2017-12-01 DIAGNOSIS — I10 Essential (primary) hypertension: Secondary | ICD-10-CM | POA: Diagnosis not present

## 2017-12-01 DIAGNOSIS — E113311 Type 2 diabetes mellitus with moderate nonproliferative diabetic retinopathy with macular edema, right eye: Secondary | ICD-10-CM

## 2017-12-01 DIAGNOSIS — H35033 Hypertensive retinopathy, bilateral: Secondary | ICD-10-CM

## 2017-12-01 DIAGNOSIS — E11311 Type 2 diabetes mellitus with unspecified diabetic retinopathy with macular edema: Secondary | ICD-10-CM | POA: Diagnosis not present

## 2017-12-01 DIAGNOSIS — E113392 Type 2 diabetes mellitus with moderate nonproliferative diabetic retinopathy without macular edema, left eye: Secondary | ICD-10-CM | POA: Diagnosis not present

## 2017-12-01 DIAGNOSIS — H43813 Vitreous degeneration, bilateral: Secondary | ICD-10-CM | POA: Diagnosis not present

## 2018-01-05 ENCOUNTER — Encounter (INDEPENDENT_AMBULATORY_CARE_PROVIDER_SITE_OTHER): Payer: Medicare HMO | Admitting: Ophthalmology

## 2018-01-05 DIAGNOSIS — E11311 Type 2 diabetes mellitus with unspecified diabetic retinopathy with macular edema: Secondary | ICD-10-CM

## 2018-01-05 DIAGNOSIS — H43813 Vitreous degeneration, bilateral: Secondary | ICD-10-CM | POA: Diagnosis not present

## 2018-01-05 DIAGNOSIS — E113311 Type 2 diabetes mellitus with moderate nonproliferative diabetic retinopathy with macular edema, right eye: Secondary | ICD-10-CM

## 2018-01-05 DIAGNOSIS — I10 Essential (primary) hypertension: Secondary | ICD-10-CM

## 2018-01-05 DIAGNOSIS — E113392 Type 2 diabetes mellitus with moderate nonproliferative diabetic retinopathy without macular edema, left eye: Secondary | ICD-10-CM

## 2018-01-05 DIAGNOSIS — H35033 Hypertensive retinopathy, bilateral: Secondary | ICD-10-CM | POA: Diagnosis not present

## 2018-01-20 ENCOUNTER — Ambulatory Visit (INDEPENDENT_AMBULATORY_CARE_PROVIDER_SITE_OTHER): Payer: Medicare HMO

## 2018-01-20 ENCOUNTER — Ambulatory Visit (INDEPENDENT_AMBULATORY_CARE_PROVIDER_SITE_OTHER): Payer: Medicare HMO | Admitting: Podiatry

## 2018-01-20 ENCOUNTER — Encounter: Payer: Self-pay | Admitting: Podiatry

## 2018-01-20 VITALS — BP 127/77 | HR 63

## 2018-01-20 DIAGNOSIS — M2042 Other hammer toe(s) (acquired), left foot: Secondary | ICD-10-CM

## 2018-01-20 DIAGNOSIS — M778 Other enthesopathies, not elsewhere classified: Secondary | ICD-10-CM

## 2018-01-20 DIAGNOSIS — Q828 Other specified congenital malformations of skin: Secondary | ICD-10-CM

## 2018-01-20 DIAGNOSIS — M779 Enthesopathy, unspecified: Principal | ICD-10-CM

## 2018-01-20 DIAGNOSIS — M7751 Other enthesopathy of right foot: Secondary | ICD-10-CM

## 2018-01-20 DIAGNOSIS — M2041 Other hammer toe(s) (acquired), right foot: Secondary | ICD-10-CM

## 2018-01-20 DIAGNOSIS — M7752 Other enthesopathy of left foot: Secondary | ICD-10-CM | POA: Diagnosis not present

## 2018-01-20 NOTE — Progress Notes (Signed)
   Subjective:    Patient ID: [name], female    DOB: 1941-09-25, 77 y.o.   MRN: [id number]  HPI: She presents today with a chief complaint of pain to the second toe with a callus particularly when she is walking and wearing shoes.    Review of Systems  Musculoskeletal: Positive for arthralgias and gait problem.  All other systems reviewed and are negative.      Objective:   Physical Exam: Vital signs are stable alert and oriented x3 pulses are palpable.  Severe hammertoe deformities are noted bilateral.  Neurologic sensorium is intact deep tendon reflexes are intact distal clavus is noted to the second digit of the right foot.        Assessment & Plan:  Assessment porokeratosis with hammertoe deformities.  Plan: Debrided lesion today placed padding.

## 2018-01-21 ENCOUNTER — Telehealth: Payer: Self-pay | Admitting: Podiatry

## 2018-01-21 NOTE — Telephone Encounter (Signed)
I was in yesterday and the doctor placed a plastic thing on my toe. I wanted to know if I can take it off to take a shower? You can call me back at [phone]. Thank you.

## 2018-01-21 NOTE — Telephone Encounter (Signed)
I informed pt that she could take the "plastic" shield off her toe to shower, and to dry the area very good prior to putting on the shield and shoes.

## 2018-02-22 ENCOUNTER — Encounter: Payer: Self-pay | Admitting: Podiatry

## 2018-02-22 ENCOUNTER — Ambulatory Visit (INDEPENDENT_AMBULATORY_CARE_PROVIDER_SITE_OTHER): Payer: Medicare HMO | Admitting: Podiatry

## 2018-02-22 DIAGNOSIS — M2042 Other hammer toe(s) (acquired), left foot: Secondary | ICD-10-CM

## 2018-02-22 DIAGNOSIS — M2041 Other hammer toe(s) (acquired), right foot: Secondary | ICD-10-CM

## 2018-02-22 NOTE — Progress Notes (Signed)
She presents today for follow-up of her right foot pain second toe.  States that my toe still hurts that she refers to the second digit right.  Objective: Vital signs are stable alert and oriented x3.  Hammertoe deformity second right resulting in distal clavus which is painful on palpation there is some blood beneath the clavus from chronic injury.  She appears to be flexible enough at the PIPJ that a flexor tenotomy may work for her otherwise will be correcting a bunion deformity and release of the second metatarsal phalangeal joint and a second metatarsal head osteotomy.  Assessment: Hammertoe deformity flexible second right.  Plan: Schedule her for an additional 30 minutes next visit for a flexor tenotomy.  This will be performed on the second digit of the right foot and a consent will be necessary.

## 2018-03-01 ENCOUNTER — Ambulatory Visit: Payer: Self-pay | Admitting: Podiatry

## 2018-03-02 ENCOUNTER — Encounter (INDEPENDENT_AMBULATORY_CARE_PROVIDER_SITE_OTHER): Payer: Self-pay | Admitting: Ophthalmology

## 2018-03-09 ENCOUNTER — Encounter (INDEPENDENT_AMBULATORY_CARE_PROVIDER_SITE_OTHER): Payer: Medicare HMO | Admitting: Ophthalmology

## 2018-03-09 DIAGNOSIS — E11311 Type 2 diabetes mellitus with unspecified diabetic retinopathy with macular edema: Secondary | ICD-10-CM | POA: Diagnosis not present

## 2018-03-09 DIAGNOSIS — E113311 Type 2 diabetes mellitus with moderate nonproliferative diabetic retinopathy with macular edema, right eye: Secondary | ICD-10-CM | POA: Diagnosis not present

## 2018-03-09 DIAGNOSIS — H43813 Vitreous degeneration, bilateral: Secondary | ICD-10-CM | POA: Diagnosis not present

## 2018-03-09 DIAGNOSIS — I10 Essential (primary) hypertension: Secondary | ICD-10-CM | POA: Diagnosis not present

## 2018-03-09 DIAGNOSIS — E113392 Type 2 diabetes mellitus with moderate nonproliferative diabetic retinopathy without macular edema, left eye: Secondary | ICD-10-CM | POA: Diagnosis not present

## 2018-03-09 DIAGNOSIS — H35033 Hypertensive retinopathy, bilateral: Secondary | ICD-10-CM

## 2018-03-17 ENCOUNTER — Telehealth: Payer: Self-pay | Admitting: Podiatry

## 2018-03-17 NOTE — Telephone Encounter (Signed)
Pt called asking for another plastic cup for her toe. She has lost the one she was originally given.

## 2018-03-17 NOTE — Telephone Encounter (Signed)
[name] told pt she could pick up a toe shield in the office.

## 2018-03-17 NOTE — Telephone Encounter (Signed)
I called earlier and left a message. Last time I saw Dr. Al Corpus he gave me a plastic cup and ring for my toe. I went out of town and lost it. I'm in a lot of pain and want more. I told the pt I got her message earlier and sent it to the nurse but that she could come into the office to purchase the items. Pt asked if someone could come in for her and I told her that was okay but I didn't know what the price of each thing she would like to purchase would be. She stated someone would be here tomorrow and I told her that our hours tomorrow are 7:30 am - 4:00 pm. Pt stated thank you.

## 2018-03-17 NOTE — Telephone Encounter (Signed)
I'm a pt of Dr. Geryl Rankins. I saw him 12 March and at that appointment he gave me a plastic cup and a plastic ring for my toe. I want to get some more but don't know where I can get it from. Please call me back at [phone]. Thank you very much.

## 2018-03-18 ENCOUNTER — Telehealth: Payer: Self-pay | Admitting: Podiatry

## 2018-03-18 NOTE — Telephone Encounter (Signed)
I was calling to see if the toe things are ready to be picked up. I told the pt she could just come in and talk to the girls at the check in desk and they can show her what products we have since we do not know what products Dr. Al Corpus gave her and that they normally are not dictated in the notes. Pt asked if someone could come in and get the items for her. I told her yes as long as they knew what she had they could come in and get it. Pt wanted to know what they are called and I said we call them toe caps and/or toe pads depending on which one she wanted since she lost hers. Pt stated she had 2 of each.

## 2018-03-24 ENCOUNTER — Encounter: Payer: Self-pay | Admitting: Podiatry

## 2018-03-24 ENCOUNTER — Ambulatory Visit (INDEPENDENT_AMBULATORY_CARE_PROVIDER_SITE_OTHER): Payer: Medicare HMO | Admitting: Podiatry

## 2018-03-24 DIAGNOSIS — M2042 Other hammer toe(s) (acquired), left foot: Secondary | ICD-10-CM

## 2018-03-24 DIAGNOSIS — M2041 Other hammer toe(s) (acquired), right foot: Secondary | ICD-10-CM

## 2018-03-24 NOTE — Patient Instructions (Signed)
Leave bandage in place and dry for 3-4 days, then remove. You may wash foot normally after removal of bandage. DO NOT SOAK FOOT! Dry completely afterwards and may use a bandaid over incision if needed. We will follow up with you in 2 weeks for recheck. 

## 2018-03-24 NOTE — Progress Notes (Signed)
She presents today complaining of pain to her second digit of her right foot.  She states that I would like to go ahead and get this fixed if we can.  She states that her blood sugars have been running good and the only thing really bothering her is her toe.  Objective: Vital signs are stable she is alert oriented x3 she has flexible hammertoe deformities #2 #3 of the right foot with the distal clavus which is exquisitely painful and pre-ulcerative in nature.  No skin breakdown is noted.  Assessment: Flexible hammertoe deformity second right with distal clavus second digit right pre-ulcerative in nature.  Plan: Discussed etiology pathology conservative versus surgical therapies.  At this point we performed a flexor tenotomy single to the right second toe.  This was performed after consent was signed for flexor tenotomy and after 3 cc of a 50-50 mixture of Marcaine plain and lidocaine plain was infiltrated in a block about the second toe.  The area was prepped and draped as normal sterile fashion.  A small stab incision was made transversely at the level of the PIPJ plantarly and with dorsiflexion at the PIPJ this tenotomy was performed with an 11 blade.  She tolerated this procedure well without complications.  The area was flushed with normal sterile saline and then a single 4-0 nylon stitch was placed.  A dresser compressive dressing was applied she was dispensed a Darco shoe and I will follow-up with her in 1 week she will call me with questions or concerns.

## 2018-04-12 ENCOUNTER — Encounter: Payer: Self-pay | Admitting: Podiatry

## 2018-04-12 ENCOUNTER — Ambulatory Visit (INDEPENDENT_AMBULATORY_CARE_PROVIDER_SITE_OTHER): Payer: Medicare HMO | Admitting: Podiatry

## 2018-04-12 DIAGNOSIS — M2042 Other hammer toe(s) (acquired), left foot: Secondary | ICD-10-CM

## 2018-04-12 DIAGNOSIS — M2041 Other hammer toe(s) (acquired), right foot: Secondary | ICD-10-CM

## 2018-04-12 NOTE — Progress Notes (Signed)
She presents today for follow-up of her tenotomy second digit right foot.  She states that it feels much better is a little swollen but it feels like a little pin tingling in the hand sometimes  Objective: Vital signs are stable she is alert and oriented x3.  There is no erythema edema cellulitis drainage or odor sutures are intact was removed demonstrates well coapted incision site.  The toe is rectus no longer painful to the distal aspect of the toe where the ulcer was.  This is gone on to heal uneventfully.  Assessment: Well-healing surgical toe second right.  Plan: Follow-up with me on an as-needed basis.

## 2018-05-04 ENCOUNTER — Encounter (INDEPENDENT_AMBULATORY_CARE_PROVIDER_SITE_OTHER): Payer: Self-pay | Admitting: Ophthalmology

## 2018-05-13 ENCOUNTER — Encounter (INDEPENDENT_AMBULATORY_CARE_PROVIDER_SITE_OTHER): Payer: Medicare HMO | Admitting: Ophthalmology

## 2018-05-13 DIAGNOSIS — E113391 Type 2 diabetes mellitus with moderate nonproliferative diabetic retinopathy without macular edema, right eye: Secondary | ICD-10-CM

## 2018-05-13 DIAGNOSIS — E113292 Type 2 diabetes mellitus with mild nonproliferative diabetic retinopathy without macular edema, left eye: Secondary | ICD-10-CM

## 2018-05-13 DIAGNOSIS — I10 Essential (primary) hypertension: Secondary | ICD-10-CM | POA: Diagnosis not present

## 2018-05-13 DIAGNOSIS — H35033 Hypertensive retinopathy, bilateral: Secondary | ICD-10-CM

## 2018-05-13 DIAGNOSIS — E11319 Type 2 diabetes mellitus with unspecified diabetic retinopathy without macular edema: Secondary | ICD-10-CM

## 2018-05-13 DIAGNOSIS — H43813 Vitreous degeneration, bilateral: Secondary | ICD-10-CM

## 2018-05-30 ENCOUNTER — Other Ambulatory Visit: Payer: Self-pay | Admitting: *Deleted

## 2018-05-30 MED ORDER — NITROGLYCERIN 0.4 MG SL SUBL: 0.4000 mg | SUBLINGUAL_TABLET | SUBLINGUAL | 0 refills | Status: AC | PRN

## 2018-08-05 ENCOUNTER — Encounter (INDEPENDENT_AMBULATORY_CARE_PROVIDER_SITE_OTHER): Payer: Self-pay | Admitting: Ophthalmology

## 2018-09-01 ENCOUNTER — Encounter (INDEPENDENT_AMBULATORY_CARE_PROVIDER_SITE_OTHER): Payer: Medicare HMO | Admitting: Ophthalmology

## 2018-09-01 DIAGNOSIS — H353121 Nonexudative age-related macular degeneration, left eye, early dry stage: Secondary | ICD-10-CM

## 2018-09-01 DIAGNOSIS — I10 Essential (primary) hypertension: Secondary | ICD-10-CM | POA: Diagnosis not present

## 2018-09-01 DIAGNOSIS — H35033 Hypertensive retinopathy, bilateral: Secondary | ICD-10-CM

## 2018-09-01 DIAGNOSIS — E113311 Type 2 diabetes mellitus with moderate nonproliferative diabetic retinopathy with macular edema, right eye: Secondary | ICD-10-CM | POA: Diagnosis not present

## 2018-09-01 DIAGNOSIS — H43813 Vitreous degeneration, bilateral: Secondary | ICD-10-CM

## 2018-09-01 DIAGNOSIS — E11311 Type 2 diabetes mellitus with unspecified diabetic retinopathy with macular edema: Secondary | ICD-10-CM

## 2018-09-01 DIAGNOSIS — E113292 Type 2 diabetes mellitus with mild nonproliferative diabetic retinopathy without macular edema, left eye: Secondary | ICD-10-CM

## 2018-09-28 NOTE — Progress Notes (Signed)
Cardiology Office Note:    Date:  09/30/2018   ID:  [name], DOB August 26, 1941, MRN [id number]  PCP:  Nolene Ebbs, MD  Cardiologist:  Sinclair Grooms, MD   Referring MD: Nolene Ebbs, MD   Chief Complaint  Patient presents with  . Coronary Artery Disease    History of Present Illness:    [name] is a 77 y.o. female with a hx of CAD status post inferior STEMI in 03/2014, PCI with overlapping DES to the mid RCA, hypertension, and hyperlipidemia.  He was a smoker.  She is not having chest discomfort.  Past Medical History:  Diagnosis Date  . Arthritis   . Coronary artery disease    a. Inf STEMI (03/2014) => LHC (03/25/14):  Ostial/prox RI 70-80%, mid RI 95%, mid RCA occluded (culprit).  EF 60% with inferobasal HK.  RI small in distribution and med Rx recommended.  PCI:  Overlapping Xience Alpine (2.5 x 12 mm and 3 x 8 mm) DES to mid RCA. // b. Myoview 6/18: EF 50, no ischemia; Low Risk   . Hyperlipidemia   . Hypertension   . Microcytic anemia   . ST elevation myocardial infarction (STEMI) of inferior wall ([place]) 03/25/2014   DES x2 RCA      Past Surgical History:  Procedure Laterality Date  . CATARACT EXTRACTION    . CORONARY ANGIOPLASTY WITH STENT PLACEMENT  03/25/2014   LAD irreg, RI 80%/95% (med Rx), RCA 100%-->0 w/ overlapping DES x 2, EF OK.  Marland Kitchen LEFT HEART CATH N/A 03/25/2014   Procedure: LEFT HEART CATH;  Surgeon: Sinclair Grooms, MD;  Location: [facility] CATH LAB;  Service: Cardiovascular;  Laterality: N/A;  . PERCUTANEOUS CORONARY STENT INTERVENTION (PCI-S)  03/25/2014   Procedure: PERCUTANEOUS CORONARY STENT INTERVENTION (PCI-S);  Surgeon: Sinclair Grooms, MD;  Location: [facility] CATH LAB;  Service: Cardiovascular;;    Current Medications: Current Meds  Medication Sig  . allopurinol (ZYLOPRIM) 100 MG tablet   . ASPIRIN LOW DOSE 81 MG EC tablet TAKE 1 TABLET BY MOUTH EVERY DAY  . atorvastatin (LIPITOR) 40 MG tablet Take 1 tablet (40 mg total) by mouth daily.    . Blood Glucose Monitoring Suppl (TRUE METRIX AIR GLUCOSE METER) w/Device KIT   . COLCRYS 0.6 MG tablet Take 0.6 mg by mouth daily.  . hydrochlorothiazide (HYDRODIURIL) 25 MG tablet Take 1 tablet (25 mg total) by mouth daily.  . hydrOXYzine (ATARAX/VISTARIL) 25 MG tablet Take 25 mg by mouth as needed for itching.   . latanoprost (XALATAN) 0.005 % ophthalmic solution Place 1 drop into both eyes at bedtime.  Marland Kitchen lisinopril (PRINIVIL,ZESTRIL) 20 MG tablet Take 20 mg by mouth daily.  . metFORMIN (GLUCOPHAGE) 500 MG tablet Take 500 mg by mouth daily.  . metoprolol succinate (TOPROL-XL) 50 MG 24 hr tablet TAKE 1 TABLET (50 MG TOTAL) BY MOUTH DAILY.  . nitroGLYCERIN (NITROSTAT) 0.4 MG SL tablet Place 1 tablet (0.4 mg total) under the tongue every 5 (five) minutes as needed for chest pain.  . pantoprazole (PROTONIX) 40 MG tablet Take 40 mg by mouth daily as needed. Acid reflux  . TRUE METRIX BLOOD GLUCOSE TEST test strip   . TRUEPLUS LANCETS 33G MISC   . VOLTAREN 1 % GEL Apply topically as directed.      Allergies:   Patient has no known allergies.   Social History   Socioeconomic History  . Marital status: Single    Spouse name: Not on  file  . Number of children: Not on file  . Years of education: Not on file  . Highest education level: Not on file  Occupational History  . Not on file  Social Needs  . Financial resource strain: Not on file  . Food insecurity:    Worry: Not on file    Inability: Not on file  . Transportation needs:    Medical: Not on file    Non-medical: Not on file  Tobacco Use  . Smoking status: Never Smoker  . Smokeless tobacco: Never Used  Substance and Sexual Activity  . Alcohol use: No    Alcohol/week: 0.0 standard drinks  . Drug use: No  . Sexual activity: Not Currently  Lifestyle  . Physical activity:    Days per week: Not on file    Minutes per session: Not on file  . Stress: Not on file  Relationships  . Social connections:    Talks on phone: Not  on file    Gets together: Not on file    Attends religious service: Not on file    Active member of club or organization: Not on file    Attends meetings of clubs or organizations: Not on file    Relationship status: Not on file  Other Topics Concern  . Not on file  Social History Narrative  . Not on file     Family History: The patient's family history includes Stroke in her sister.  ROS:   Please see the history of present illness.    Right knee arthritis prevents activity.  Otherwise no complaints.  All other systems reviewed and are negative.  EKGs/Labs/Other Studies Reviewed:    The following studies were reviewed today: Myocardial perfusion imaging June 2018: Study Highlights    Nuclear stress EF: 50%.  There was no ST segment deviation noted during stress.  The study is normal. No ischemia.  This is a low risk study.  The left ventricular ejection fraction is mildly decreased (45-54%).     EKG:  EKG is  ordered today.  The ekg ordered today demonstrates normal sinus rhythm with incomplete right bundle branch block.  Recent Labs: No results found for requested labs within last 8760 hours.  Recent Lipid Panel    Component Value Date/Time   CHOL 145 03/07/2015 0911   TRIG 89.0 03/07/2015 0911   HDL 50.40 03/07/2015 0911   CHOLHDL 3 03/07/2015 0911   VLDL 17.8 03/07/2015 0911   LDLCALC 77 03/07/2015 0911    Physical Exam:    VS:  BP 114/68   Pulse 79   Ht _0  (1.626 m)   Wt 154 lb (69.9 kg)   BMI 26.43 kg/m     Wt Readings from Last 3 Encounters:  09/30/18 154 lb (69.9 kg)  08/02/17 153 lb 6.4 oz (69.6 kg)  06/01/17 154 lb (69.9 kg)     GEN:  Well nourished, well developed in no acute distress HEENT: Normal NECK: No JVD. LYMPHATICS: No lymphadenopathy CARDIAC:RRR, no murmur, S4 gallop, no edema. VASCULAR: 2+ bilateral radial, carotid, and posterior tibial bilateral pulses.  No bruits. RESPIRATORY:  Clear to auscultation without rales,  wheezing or rhonchi  ABDOMEN: Soft, non-tender, non-distended, No pulsatile mass, MUSCULOSKELETAL: No deformity  SKIN: Warm and dry NEUROLOGIC:  Alert and oriented x 3 PSYCHIATRIC:  Normal affect   ASSESSMENT:    1. Essential hypertension   2. Coronary artery disease involving native coronary artery of native heart without angina pectoris  3. Mixed hyperlipidemia   4. Right bundle branch block   5. History of acute inferior wall MI   6. Controlled type 2 diabetes mellitus with diabetic nephropathy, without long-term current use of insulin (HCC)    PLAN:    In order of problems listed above:  1. Blood pressure is under excellent control currently.  Target 130/80 mmHg or less. 2. Asymptomatic with reference to angina.  Aggressive secondary risk prevention: A1c less than 7, blood pressure target less than 130/80 mmHg, LDL cholesterol less than 70, moderate aerobic greater than 150 minutes/week, weight control, and notify if any chest discomfort. 3. No recent LDL documentation.  Clearly her primary physician Dr. Jeanie Cooks 4. No change EKG appearance 5. Recent A1c was 7.5.  Target is less than 7.  Clinic follow-up in 1 year.  Strongly encourage moderate aerobic activity.  Secondary risk prevention discussed in detail.  We will get information from her primary physician concerning recent labs.   Medication Adjustments/Labs and Tests Ordered: Current medicines are reviewed at length with the patient today.  Concerns regarding medicines are outlined above.  Orders Placed This Encounter  Procedures  . EKG 12-Lead   No orders of the defined types were placed in this encounter.   Patient Instructions  Medication Instructions:  Your physician recommends that you continue on your current medications as directed. Please refer to the Current Medication list given to you today.  If you need a refill on your cardiac medications before your next appointment, please call your pharmacy.   Lab  work: None If you have labs (blood work) drawn today and your tests are completely normal, you will receive your results only by: Marland Kitchen MyChart Message (if you have MyChart) OR . A paper copy in the mail If you have any lab test that is abnormal or we need to change your treatment, we will call you to review the results.  Testing/Procedures: None  Follow-Up: At [facility], you and your health needs are our priority.  As part of our continuing mission to provide you with exceptional heart care, we have created designated Provider Care Teams.  These Care Teams include your primary Cardiologist (physician) and Advanced Practice Providers (APPs -  Physician Assistants and Nurse Practitioners) who all work together to provide you with the care you need, when you need it. You will need a follow up appointment in 12 months.  Please call our office 2 months in advance to schedule this appointment.  You may see Sinclair Grooms, MD or one of the following Advanced Practice Providers on your designated Care Team:   Truitt Merle, NP Cecilie Kicks, NP . Kathyrn Drown, NP  Any Other Special Instructions Will Be Listed Below (If Applicable).       Signed, Sinclair Grooms, MD  09/30/2018 10:18 AM    [place]

## 2018-09-30 ENCOUNTER — Encounter: Payer: Self-pay | Admitting: Interventional Cardiology

## 2018-09-30 ENCOUNTER — Ambulatory Visit (INDEPENDENT_AMBULATORY_CARE_PROVIDER_SITE_OTHER): Payer: Medicare HMO | Admitting: Interventional Cardiology

## 2018-09-30 ENCOUNTER — Encounter (INDEPENDENT_AMBULATORY_CARE_PROVIDER_SITE_OTHER): Payer: Self-pay

## 2018-09-30 VITALS — BP 114/68 | HR 79 | Ht 64.0 in | Wt 154.0 lb

## 2018-09-30 DIAGNOSIS — I2510 Atherosclerotic heart disease of native coronary artery without angina pectoris: Secondary | ICD-10-CM

## 2018-09-30 DIAGNOSIS — I10 Essential (primary) hypertension: Secondary | ICD-10-CM | POA: Diagnosis not present

## 2018-09-30 DIAGNOSIS — I252 Old myocardial infarction: Secondary | ICD-10-CM

## 2018-09-30 DIAGNOSIS — I4510 Unspecified right bundle-branch block: Secondary | ICD-10-CM | POA: Diagnosis not present

## 2018-09-30 DIAGNOSIS — E782 Mixed hyperlipidemia: Secondary | ICD-10-CM

## 2018-09-30 DIAGNOSIS — E1121 Type 2 diabetes mellitus with diabetic nephropathy: Secondary | ICD-10-CM

## 2018-09-30 NOTE — Patient Instructions (Signed)

## 2018-10-26 ENCOUNTER — Ambulatory Visit
Admission: RE | Admit: 2018-10-26 | Discharge: 2018-10-26 | Disposition: A | Discharge status: Home / Self Care | Payer: Medicare HMO | Source: Ambulatory Visit | Attending: Internal Medicine | Admitting: Internal Medicine

## 2018-10-26 DIAGNOSIS — E2839 Other primary ovarian failure: Secondary | ICD-10-CM

## 2018-12-01 ENCOUNTER — Encounter (INDEPENDENT_AMBULATORY_CARE_PROVIDER_SITE_OTHER): Payer: Medicare HMO | Admitting: Ophthalmology

## 2018-12-01 DIAGNOSIS — E113391 Type 2 diabetes mellitus with moderate nonproliferative diabetic retinopathy without macular edema, right eye: Secondary | ICD-10-CM

## 2018-12-01 DIAGNOSIS — I10 Essential (primary) hypertension: Secondary | ICD-10-CM

## 2018-12-01 DIAGNOSIS — E113292 Type 2 diabetes mellitus with mild nonproliferative diabetic retinopathy without macular edema, left eye: Secondary | ICD-10-CM

## 2018-12-01 DIAGNOSIS — H353131 Nonexudative age-related macular degeneration, bilateral, early dry stage: Secondary | ICD-10-CM | POA: Diagnosis not present

## 2018-12-01 DIAGNOSIS — E11319 Type 2 diabetes mellitus with unspecified diabetic retinopathy without macular edema: Secondary | ICD-10-CM

## 2018-12-01 DIAGNOSIS — H43813 Vitreous degeneration, bilateral: Secondary | ICD-10-CM

## 2018-12-01 DIAGNOSIS — H35033 Hypertensive retinopathy, bilateral: Secondary | ICD-10-CM

## 2019-03-09 ENCOUNTER — Encounter (INDEPENDENT_AMBULATORY_CARE_PROVIDER_SITE_OTHER): Payer: Self-pay | Admitting: Ophthalmology

## 2019-05-22 ENCOUNTER — Telehealth: Payer: Self-pay | Admitting: Interventional Cardiology

## 2019-05-22 NOTE — Telephone Encounter (Signed)
Patient would like to talk to Dr. Tamala Julian personally as she has a grandson that is going to college to become a cardiologist, and she would like to discuss somethings with Dr. Tamala Julian.

## 2019-08-07 ENCOUNTER — Encounter (INDEPENDENT_AMBULATORY_CARE_PROVIDER_SITE_OTHER): Payer: Medicare HMO | Admitting: Ophthalmology

## 2019-08-10 ENCOUNTER — Encounter (INDEPENDENT_AMBULATORY_CARE_PROVIDER_SITE_OTHER): Payer: Medicare HMO | Admitting: Ophthalmology

## 2019-08-16 ENCOUNTER — Encounter (INDEPENDENT_AMBULATORY_CARE_PROVIDER_SITE_OTHER): Payer: Medicare HMO | Admitting: Ophthalmology

## 2019-08-17 ENCOUNTER — Other Ambulatory Visit: Payer: Self-pay

## 2019-08-17 ENCOUNTER — Encounter (INDEPENDENT_AMBULATORY_CARE_PROVIDER_SITE_OTHER): Payer: Medicare HMO | Admitting: Ophthalmology

## 2019-08-17 DIAGNOSIS — H43813 Vitreous degeneration, bilateral: Secondary | ICD-10-CM

## 2019-08-17 DIAGNOSIS — E113292 Type 2 diabetes mellitus with mild nonproliferative diabetic retinopathy without macular edema, left eye: Secondary | ICD-10-CM | POA: Diagnosis not present

## 2019-08-17 DIAGNOSIS — H35033 Hypertensive retinopathy, bilateral: Secondary | ICD-10-CM

## 2019-08-17 DIAGNOSIS — E113391 Type 2 diabetes mellitus with moderate nonproliferative diabetic retinopathy without macular edema, right eye: Secondary | ICD-10-CM

## 2019-08-17 DIAGNOSIS — I10 Essential (primary) hypertension: Secondary | ICD-10-CM

## 2019-08-17 DIAGNOSIS — E11311 Type 2 diabetes mellitus with unspecified diabetic retinopathy with macular edema: Secondary | ICD-10-CM

## 2019-08-17 DIAGNOSIS — H353131 Nonexudative age-related macular degeneration, bilateral, early dry stage: Secondary | ICD-10-CM

## 2019-10-19 NOTE — Progress Notes (Signed)
Cardiology Office Note:    Date:  10/20/2019   ID:  [name], DOB [birth year], MRN [id number]  PCP:  Nolene Ebbs, MD  Cardiologist:  Sinclair Grooms, MD   Referring MD: Nolene Ebbs, MD   Chief Complaint  Patient presents with  . Coronary Artery Disease    History of Present Illness:    [name] is a 78 y.o. adult with a hx of CAD status post inferior STEMI in 03/2014, PCI with overlapping DES to the mid RCA, hypertension, and hyperlipidemia.  [name] is doing relatively well.  She denies chest discomfort, dyspnea, swelling, orthopnea, PND, palpitations, and medication side effects.  She wants to get lisinopril and pantoprazole refilled.  She is falling short of 150 minutes of moderate activity per week.  This was encouraged.  Past Medical History:  Diagnosis Date  . Arthritis   . Coronary artery disease    a. Inf STEMI (03/2014) => LHC (03/25/14):  Ostial/prox RI 70-80%, mid RI 95%, mid RCA occluded (culprit).  EF 60% with inferobasal HK.  RI small in distribution and med Rx recommended.  PCI:  Overlapping Xience Alpine (2.5 x 12 mm and 3 x 8 mm) DES to mid RCA. // b. Myoview 6/18: EF 50, no ischemia; Low Risk   . Hyperlipidemia   . Hypertension   . Microcytic anemia   . ST elevation myocardial infarction (STEMI) of inferior wall ([place]) 03/25/2014   DES x2 RCA      Past Surgical History:  Procedure Laterality Date  . CATARACT EXTRACTION    . CORONARY ANGIOPLASTY WITH STENT PLACEMENT  03/25/2014   LAD irreg, RI 80%/95% (med Rx), RCA 100%-->0 w/ overlapping DES x 2, EF OK.  Marland Kitchen LEFT HEART CATH N/A 03/25/2014   Procedure: LEFT HEART CATH;  Surgeon: Sinclair Grooms, MD;  Location: [facility] CATH LAB;  Service: Cardiovascular;  Laterality: N/A;  . PERCUTANEOUS CORONARY STENT INTERVENTION (PCI-S)  03/25/2014   Procedure: PERCUTANEOUS CORONARY STENT INTERVENTION (PCI-S);  Surgeon: Sinclair Grooms, MD;  Location: [facility] CATH LAB;  Service: Cardiovascular;;    Current  Medications: Current Meds  Medication Sig  . allopurinol (ZYLOPRIM) 100 MG tablet   . ASPIRIN LOW DOSE 81 MG EC tablet TAKE 1 TABLET BY MOUTH EVERY DAY  . atorvastatin (LIPITOR) 40 MG tablet Take 1 tablet (40 mg total) by mouth daily.  . Blood Glucose Monitoring Suppl (TRUE METRIX AIR GLUCOSE METER) w/Device KIT   . COLCRYS 0.6 MG tablet Take 0.6 mg by mouth daily.  . hydrochlorothiazide (HYDRODIURIL) 25 MG tablet Take 1 tablet (25 mg total) by mouth daily.  . hydrOXYzine (ATARAX/VISTARIL) 25 MG tablet Take 25 mg by mouth as needed for itching.   . latanoprost (XALATAN) 0.005 % ophthalmic solution Place 1 drop into both eyes at bedtime.  Marland Kitchen lisinopril (PRINIVIL,ZESTRIL) 20 MG tablet Take 20 mg by mouth daily.  . metFORMIN (GLUCOPHAGE) 500 MG tablet Take 500 mg by mouth daily.  . metoprolol succinate (TOPROL-XL) 50 MG 24 hr tablet TAKE 1 TABLET (50 MG TOTAL) BY MOUTH DAILY.  . nitroGLYCERIN (NITROSTAT) 0.4 MG SL tablet Place 1 tablet (0.4 mg total) under the tongue every 5 (five) minutes as needed for chest pain.  . pantoprazole (PROTONIX) 40 MG tablet Take 40 mg by mouth daily as needed. Acid reflux  . TRUE METRIX BLOOD GLUCOSE TEST test strip   . TRUEPLUS LANCETS 33G MISC   . VOLTAREN 1 % GEL Apply topically as  directed.      Allergies:   Patient has no known allergies.   Social History   Socioeconomic History  . Marital status: Single    Spouse name: Not on file  . Number of children: Not on file  . Years of education: Not on file  . Highest education level: Not on file  Occupational History  . Not on file  Social Needs  . Financial resource strain: Not on file  . Food insecurity    Worry: Not on file    Inability: Not on file  . Transportation needs    Medical: Not on file    Non-medical: Not on file  Tobacco Use  . Smoking status: Never Smoker  . Smokeless tobacco: Never Used  Substance and Sexual Activity  . Alcohol use: No    Alcohol/week: 0.0 standard drinks  .  Drug use: No  . Sexual activity: Not Currently  Lifestyle  . Physical activity    Days per week: Not on file    Minutes per session: Not on file  . Stress: Not on file  Relationships  . Social Herbalist on phone: Not on file    Gets together: Not on file    Attends religious service: Not on file    Active member of club or organization: Not on file    Attends meetings of clubs or organizations: Not on file    Relationship status: Not on file  Other Topics Concern  . Not on file  Social History Narrative  . Not on file     Family History: The patient's family history includes Stroke in his sister.  ROS:   Please see the history of present illness.    No complaints.  No arthritis.  She does have heartburn for which Protonix helps.  She has not had recent laboratory data due to the COVID-19 pandemic.  Last available blood work is from 2018.  All other systems reviewed and are negative.  EKGs/Labs/Other Studies Reviewed:    The following studies were reviewed today: She has not had any imaging recently.  EKG:  EKG normal sinus rhythm with sinus arrhythmia, incomplete right bundle, and left atrial abnormality.  Recent Labs: No results found for requested labs within last 8760 hours.  Recent Lipid Panel    Component Value Date/Time   CHOL 145 03/07/2015 0911   TRIG 89.0 03/07/2015 0911   HDL 50.40 03/07/2015 0911   CHOLHDL 3 03/07/2015 0911   VLDL 17.8 03/07/2015 0911   LDLCALC 77 03/07/2015 0911    Physical Exam:    VS:  BP (!) 148/82   Pulse 78   Ht '5\' 4"'  (1.626 m)   Wt 158 lb (71.7 kg)   BMI 27.12 kg/m     Wt Readings from Last 3 Encounters:  10/20/19 158 lb (71.7 kg)  09/30/18 154 lb (69.9 kg)  08/02/17 153 lb 6.4 oz (69.6 kg)     GEN: Moderate obesity.  Appears younger than stated age.. No acute distress HEENT: Normal NECK: No JVD. LYMPHATICS: No lymphadenopathy CARDIAC:  RRR without murmur, gallop, or edema. VASCULAR:  Normal Pulses. No  bruits. RESPIRATORY:  Clear to auscultation without rales, wheezing or rhonchi  ABDOMEN: Soft, non-tender, non-distended, No pulsatile mass, MUSCULOSKELETAL: No deformity  SKIN: Warm and dry NEUROLOGIC:  Alert and oriented x 3 PSYCHIATRIC:  Normal affect   ASSESSMENT:    1. Coronary artery disease involving native coronary artery of native heart without angina pectoris  2. Mixed hyperlipidemia   3. Right bundle branch block   4. Controlled type 2 diabetes mellitus with diabetic nephropathy, without long-term current use of insulin ([place])   5. Educated about COVID-19 virus infection    PLAN:    In order of problems listed above:  1. Secondary prevention discussed. 2. There is no recent lipid data to react to.  LDL target less than 70 is the goal.  A lipid panel will be obtained today. 3. Unchanged 4. Hemoglobin A1c has not been performed recently.  We will check that today with lipid profile. 5. The 3W's are discussed and endorses lifestyle activities.   Medication Adjustments/Labs and Tests Ordered: Current medicines are reviewed at length with the patient today.  Concerns regarding medicines are outlined above.  No orders of the defined types were placed in this encounter.  No orders of the defined types were placed in this encounter.   There are no Patient Instructions on file for this visit.   Signed, Sinclair Grooms, MD  10/20/2019 3:35 PM    [facility]

## 2019-10-20 ENCOUNTER — Other Ambulatory Visit: Payer: Self-pay

## 2019-10-20 ENCOUNTER — Ambulatory Visit (INDEPENDENT_AMBULATORY_CARE_PROVIDER_SITE_OTHER): Payer: Medicare HMO | Admitting: Interventional Cardiology

## 2019-10-20 ENCOUNTER — Encounter: Payer: Self-pay | Admitting: Interventional Cardiology

## 2019-10-20 VITALS — BP 148/82 | HR 78 | Ht 64.0 in | Wt 158.0 lb

## 2019-10-20 DIAGNOSIS — E1121 Type 2 diabetes mellitus with diabetic nephropathy: Secondary | ICD-10-CM | POA: Diagnosis not present

## 2019-10-20 DIAGNOSIS — E782 Mixed hyperlipidemia: Secondary | ICD-10-CM

## 2019-10-20 DIAGNOSIS — I2510 Atherosclerotic heart disease of native coronary artery without angina pectoris: Secondary | ICD-10-CM

## 2019-10-20 DIAGNOSIS — Z7189 Other specified counseling: Secondary | ICD-10-CM

## 2019-10-20 DIAGNOSIS — I4510 Unspecified right bundle-branch block: Secondary | ICD-10-CM | POA: Diagnosis not present

## 2019-10-20 MED ORDER — LISINOPRIL 20 MG PO TABS: 20.0000 mg | ORAL_TABLET | Freq: Every day | ORAL | 3 refills | Status: AC

## 2019-10-20 MED ORDER — PANTOPRAZOLE SODIUM 40 MG PO TBEC: DELAYED_RELEASE_TABLET | ORAL | 0 refills | Status: AC

## 2019-10-20 NOTE — Patient Instructions (Signed)
Medication Instructions:  Your physician recommends that you continue on your current medications as directed. Please refer to the Current Medication list given to you today.  *If you need a refill on your cardiac medications before your next appointment, please call your pharmacy*  Lab Work: BMET, Lipid, Liver, and A1C today  If you have labs (blood work) drawn today and your tests are completely normal, you will receive your results only by: Marland Kitchen MyChart Message (if you have MyChart) OR . A paper copy in the mail If you have any lab test that is abnormal or we need to change your treatment, we will call you to review the results.  Testing/Procedures: None  Follow-Up: At [facility], you and your health needs are our priority.  As part of our continuing mission to provide you with exceptional heart care, we have created designated Provider Care Teams.  These Care Teams include your primary Cardiologist (physician) and Advanced Practice Providers (APPs -  Physician Assistants and Nurse Practitioners) who all work together to provide you with the care you need, when you need it.  Your next appointment:   12 months  The format for your next appointment:   In Person  Provider:   You may see Sinclair Grooms, MD or one of the following Advanced Practice Providers on your designated Care Team:    Truitt Merle, NP  Cecilie Kicks, NP  Kathyrn Drown, NP   Other Instructions

## 2019-10-21 LAB — LIPID PANEL
Chol/HDL Ratio: 3.4 ratio (ref 0.0–4.4)
Cholesterol, Total: 249 mg/dL — ABNORMAL HIGH (ref 100–199)
HDL: 74 mg/dL (ref >39)
LDL Chol Calc (NIH): 147 mg/dL — ABNORMAL HIGH (ref 0–99)
Triglycerides: 157 mg/dL — ABNORMAL HIGH (ref 0–149)
VLDL Cholesterol Cal: 28 mg/dL (ref 5–40)

## 2019-10-21 LAB — HEPATIC FUNCTION PANEL
ALT: 18 IU/L (ref 0–32)
AST: 21 IU/L (ref 0–40)
Albumin: 4.8 g/dL — ABNORMAL HIGH (ref 3.7–4.7)
Alkaline Phosphatase: 86 IU/L (ref 39–117)
Bilirubin Total: 0.3 mg/dL (ref 0.0–1.2)
Bilirubin, Direct: 0.1 mg/dL (ref 0.00–0.40)
Total Protein: 7.4 g/dL (ref 6.0–8.5)

## 2019-10-21 LAB — BASIC METABOLIC PANEL
BUN/Creatinine Ratio: 16 (ref 12–28)
BUN: 14 mg/dL (ref 8–27)
CO2: 25 mmol/L (ref 20–29)
Calcium: 10.6 mg/dL — ABNORMAL HIGH (ref 8.7–10.3)
Chloride: 102 mmol/L (ref 96–106)
Creatinine, Ser: 0.88 mg/dL (ref 0.57–1.00)
GFR calc Af Amer: 74 mL/min/{1.73_m2} (ref >59)
GFR calc non Af Amer: 64 mL/min/{1.73_m2} (ref >59)
Glucose: 94 mg/dL (ref 65–99)
Potassium: 4.9 mmol/L (ref 3.5–5.2)
Sodium: 140 mmol/L (ref 134–144)

## 2019-10-21 LAB — HEMOGLOBIN A1C
Est. average glucose Bld gHb Est-mCnc: 126 mg/dL
Hgb A1c MFr Bld: 6 % — ABNORMAL HIGH (ref 4.8–5.6)

## 2019-10-27 ENCOUNTER — Telehealth: Payer: Self-pay | Admitting: *Deleted

## 2019-10-27 DIAGNOSIS — E782 Mixed hyperlipidemia: Secondary | ICD-10-CM

## 2019-10-27 MED ORDER — ROSUVASTATIN CALCIUM 40 MG PO TABS: 40.0000 mg | ORAL_TABLET | Freq: Every day | ORAL | 3 refills | Status: DC

## 2019-10-27 NOTE — Telephone Encounter (Signed)
Spoke with pt and went over results and recommendations.  Pt will come for labs on 12/28.  Pt verbalized understanding and was in agreement with plan.

## 2019-10-27 NOTE — Telephone Encounter (Signed)
-----   Message from Belva Crome, MD sent at 10/26/2019  5:51 PM EST ----- Let the patient know the cholesterol is too high. Change to Rosuvastatin 40 mg daily. Liver and lipid in 6 weeks. A copy will be sent to Nolene Ebbs, MD

## 2019-12-06 ENCOUNTER — Other Ambulatory Visit: Payer: Self-pay | Admitting: Interventional Cardiology

## 2019-12-06 NOTE — Telephone Encounter (Signed)
Per prescription in chart, further refills need to come from PCP.

## 2019-12-21 ENCOUNTER — Encounter (INDEPENDENT_AMBULATORY_CARE_PROVIDER_SITE_OTHER): Payer: Medicare HMO | Admitting: Ophthalmology

## 2020-01-08 ENCOUNTER — Encounter (INDEPENDENT_AMBULATORY_CARE_PROVIDER_SITE_OTHER): Payer: Medicare HMO | Admitting: Ophthalmology

## 2020-01-08 DIAGNOSIS — E113311 Type 2 diabetes mellitus with moderate nonproliferative diabetic retinopathy with macular edema, right eye: Secondary | ICD-10-CM | POA: Diagnosis not present

## 2020-01-08 DIAGNOSIS — H353121 Nonexudative age-related macular degeneration, left eye, early dry stage: Secondary | ICD-10-CM

## 2020-01-08 DIAGNOSIS — E11311 Type 2 diabetes mellitus with unspecified diabetic retinopathy with macular edema: Secondary | ICD-10-CM

## 2020-01-08 DIAGNOSIS — E113392 Type 2 diabetes mellitus with moderate nonproliferative diabetic retinopathy without macular edema, left eye: Secondary | ICD-10-CM

## 2020-01-08 DIAGNOSIS — I10 Essential (primary) hypertension: Secondary | ICD-10-CM

## 2020-01-08 DIAGNOSIS — H43813 Vitreous degeneration, bilateral: Secondary | ICD-10-CM

## 2020-01-08 DIAGNOSIS — H35033 Hypertensive retinopathy, bilateral: Secondary | ICD-10-CM

## 2020-04-29 ENCOUNTER — Encounter (INDEPENDENT_AMBULATORY_CARE_PROVIDER_SITE_OTHER): Payer: Medicare HMO | Admitting: Ophthalmology

## 2020-04-29 ENCOUNTER — Other Ambulatory Visit: Payer: Self-pay

## 2020-04-29 DIAGNOSIS — I10 Essential (primary) hypertension: Secondary | ICD-10-CM

## 2020-04-29 DIAGNOSIS — E113391 Type 2 diabetes mellitus with moderate nonproliferative diabetic retinopathy without macular edema, right eye: Secondary | ICD-10-CM | POA: Diagnosis not present

## 2020-04-29 DIAGNOSIS — H35033 Hypertensive retinopathy, bilateral: Secondary | ICD-10-CM

## 2020-04-29 DIAGNOSIS — E113292 Type 2 diabetes mellitus with mild nonproliferative diabetic retinopathy without macular edema, left eye: Secondary | ICD-10-CM | POA: Diagnosis not present

## 2020-04-29 DIAGNOSIS — H43813 Vitreous degeneration, bilateral: Secondary | ICD-10-CM

## 2020-04-29 DIAGNOSIS — E11319 Type 2 diabetes mellitus with unspecified diabetic retinopathy without macular edema: Secondary | ICD-10-CM

## 2020-09-02 ENCOUNTER — Encounter (INDEPENDENT_AMBULATORY_CARE_PROVIDER_SITE_OTHER): Payer: Medicare HMO | Admitting: Ophthalmology

## 2020-09-02 ENCOUNTER — Other Ambulatory Visit: Payer: Self-pay

## 2020-09-02 DIAGNOSIS — I10 Essential (primary) hypertension: Secondary | ICD-10-CM

## 2020-09-02 DIAGNOSIS — E113393 Type 2 diabetes mellitus with moderate nonproliferative diabetic retinopathy without macular edema, bilateral: Secondary | ICD-10-CM | POA: Diagnosis not present

## 2020-09-02 DIAGNOSIS — H35033 Hypertensive retinopathy, bilateral: Secondary | ICD-10-CM

## 2020-09-02 DIAGNOSIS — H43813 Vitreous degeneration, bilateral: Secondary | ICD-10-CM

## 2020-09-02 DIAGNOSIS — E11319 Type 2 diabetes mellitus with unspecified diabetic retinopathy without macular edema: Secondary | ICD-10-CM | POA: Diagnosis not present

## 2020-11-05 NOTE — Progress Notes (Signed)
Cardiology Office Note:    Date:  11/06/2020   ID:  [name], DOB 1940/12/15, MRN [id number]  PCP:  Nolene Ebbs, MD  Cardiologist:  Sinclair Grooms, MD   Referring MD: Nolene Ebbs, MD   Chief Complaint  Patient presents with  . Coronary Artery Disease  . Hyperlipidemia  . Hypertension    History of Present Illness:    [name] is a 79 y.o. adult with a hx of a hx of CAD status post inferior STEMI in 03/2014,PCI with overlapping DES to the mid RCA, hypertension, and hyperlipidemia.  Accompanied by her daughter today. She is doing well. She denies angina. She is not having any difficulty with orthopnea, PND, chest pain, and states she is compliant with current medical regimen. When seen a year ago the LDL was 140. Her lipid therapy was changed to rosuvastatin 40 mg/day. She never returned for lipid follow-up. Current lipid and liver status is not known.  She denies claudication and difficulty with her breathing. No neurological complaints.  Past Medical History:  Diagnosis Date  . Arthritis   . Coronary artery disease    a. Inf STEMI (03/2014) => LHC (03/25/14):  Ostial/prox RI 70-80%, mid RI 95%, mid RCA occluded (culprit).  EF 60% with inferobasal HK.  RI small in distribution and med Rx recommended.  PCI:  Overlapping Xience Alpine (2.5 x 12 mm and 3 x 8 mm) DES to mid RCA. // b. Myoview 6/18: EF 50, no ischemia; Low Risk   . Hyperlipidemia   . Hypertension   . Microcytic anemia   . ST elevation myocardial infarction (STEMI) of inferior wall ([place]) 03/25/2014   DES x2 RCA      Past Surgical History:  Procedure Laterality Date  . CATARACT EXTRACTION    . CORONARY ANGIOPLASTY WITH STENT PLACEMENT  03/25/2014   LAD irreg, RI 80%/95% (med Rx), RCA 100%-->0 w/ overlapping DES x 2, EF OK.  Marland Kitchen LEFT HEART CATH N/A 03/25/2014   Procedure: LEFT HEART CATH;  Surgeon: Sinclair Grooms, MD;  Location: [facility] CATH LAB;  Service: Cardiovascular;  Laterality: N/A;  .  PERCUTANEOUS CORONARY STENT INTERVENTION (PCI-S)  03/25/2014   Procedure: PERCUTANEOUS CORONARY STENT INTERVENTION (PCI-S);  Surgeon: Sinclair Grooms, MD;  Location[facility] CATH LAB;  Service: Cardiovascular;;    Current Medications: Current Meds  Medication Sig  . allopurinol (ZYLOPRIM) 100 MG tablet   . ASPIRIN LOW DOSE 81 MG EC tablet TAKE 1 TABLET BY MOUTH EVERY DAY  . Blood Glucose Monitoring Suppl (TRUE METRIX AIR GLUCOSE METER) w/Device KIT   . COLCRYS 0.6 MG tablet Take 0.6 mg by mouth daily.  . hydrochlorothiazide (HYDRODIURIL) 25 MG tablet Take 1 tablet (25 mg total) by mouth daily.  . hydrOXYzine (ATARAX/VISTARIL) 25 MG tablet Take 25 mg by mouth as needed for itching.   . latanoprost (XALATAN) 0.005 % ophthalmic solution Place 1 drop into both eyes at bedtime.  Marland Kitchen lisinopril (ZESTRIL) 20 MG tablet Take 1 tablet (20 mg total) by mouth daily.  . metFORMIN (GLUCOPHAGE) 500 MG tablet Take 500 mg by mouth daily.  . metoprolol succinate (TOPROL-XL) 50 MG 24 hr tablet TAKE 1 TABLET (50 MG TOTAL) BY MOUTH DAILY.  . nitroGLYCERIN (NITROSTAT) 0.4 MG SL tablet Place 1 tablet (0.4 mg total) under the tongue every 5 (five) minutes as needed for chest pain.  . pantoprazole (PROTONIX) 40 MG tablet Take 40 mg by mouth daily as needed. Acid reflux  .  rosuvastatin (CRESTOR) 40 MG tablet Take 1 tablet (40 mg total) by mouth daily.  . TRUE METRIX BLOOD GLUCOSE TEST test strip   . TRUEPLUS LANCETS 33G MISC   . VOLTAREN 1 % GEL Apply topically as directed.      Allergies:   Patient has no known allergies.   Social History   Socioeconomic History  . Marital status: Single    Spouse name: Not on file  . Number of children: Not on file  . Years of education: Not on file  . Highest education level: Not on file  Occupational History  . Not on file  Tobacco Use  . Smoking status: Never Smoker  . Smokeless tobacco: Never Used  Vaping Use  . Vaping Use: Never used  Substance and Sexual Activity    . Alcohol use: No    Alcohol/week: 0.0 standard drinks  . Drug use: No  . Sexual activity: Not Currently  Other Topics Concern  . Not on file  Social History Narrative  . Not on file   Social Determinants of Health   Financial Resource Strain:   . Difficulty of Paying Living Expenses: Not on file  Food Insecurity:   . Worried About Charity fundraiser in the Last Year: Not on file  . Ran Out of Food in the Last Year: Not on file  Transportation Needs:   . Lack of Transportation (Medical): Not on file  . Lack of Transportation (Non-Medical): Not on file  Physical Activity:   . Days of Exercise per Week: Not on file  . Minutes of Exercise per Session: Not on file  Stress:   . Feeling of Stress : Not on file  Social Connections:   . Frequency of Communication with Friends and Family: Not on file  . Frequency of Social Gatherings with Friends and Family: Not on file  . Attends Religious Services: Not on file  . Active Member of Clubs or Organizations: Not on file  . Attends Archivist Meetings: Not on file  . Marital Status: Not on file     Family History: The patient's family history includes Stroke in his sister.  ROS:   Please see the history of present illness.    Not physically active. No claudication. No dyspnea. All other systems reviewed and are negative.  EKGs/Labs/Other Studies Reviewed:    The following studies were reviewed today: No recent cardiac imaging  EKG:  EKG normal sinus rhythm, incomplete right bundle branch block, leftward axis.  When compared to 10/20/2019 no significant changes noted.  Recent Labs: No results found for requested labs within last 8760 hours.  Recent Lipid Panel    Component Value Date/Time   CHOL 249 (H) 10/20/2019 1601   TRIG 157 (H) 10/20/2019 1601   HDL 74 10/20/2019 1601   CHOLHDL 3.4 10/20/2019 1601   CHOLHDL 3 03/07/2015 0911   VLDL 17.8 03/07/2015 0911   LDLCALC 147 (H) 10/20/2019 1601    Physical  Exam:    VS:  BP (!) 143/82   Pulse 71   Temp (!) 97.2 F (36.2 C)   Ht '5\' 4"'  (1.626 m)   Wt 163 lb (73.9 kg)   SpO2 99%   BMI 27.98 kg/m     Wt Readings from Last 3 Encounters:  11/06/20 163 lb (73.9 kg)  10/20/19 158 lb (71.7 kg)  09/30/18 154 lb (69.9 kg)     GEN: Appears healthy and is dressed to the "nines". No acute distress  HEENT: Normal NECK: No JVD. LYMPHATICS: No lymphadenopathy CARDIAC:  RRR without murmur, gallop, or edema. VASCULAR:  Normal Pulses. No bruits. RESPIRATORY:  Clear to auscultation without rales, wheezing or rhonchi  ABDOMEN: Soft, non-tender, non-distended, No pulsatile mass, MUSCULOSKELETAL: No deformity  SKIN: Warm and dry NEUROLOGIC:  Alert and oriented x 3 PSYCHIATRIC:  Normal affect   ASSESSMENT:    1. Coronary artery disease involving native coronary artery of native heart without angina pectoris   2. Mixed hyperlipidemia   3. Controlled type 2 diabetes mellitus with diabetic nephropathy, without long-term current use of insulin ([place])   4. Right bundle branch block   5. Educated about COVID-19 virus infection    PLAN:    In order of problems listed above:  1. She needs to improve on achieving 150 minutes of moderate activity per week. Lipids also elevated as of last blood check 1 year ago. She never followed up. Encouraged 30 minutes of moderate activity five out of the 7 days/week. 2. Target LDL is less than 70. A lipid panel will be obtained today. 3. Hemoglobin A1c will be obtained today. 4. Right bundle was not present on the patient's EKG today. See report above. 5. She is vaccinated and practicing social distancing.  Overall education and awareness concerning secondary risk prevention was discussed in detail: LDL less than 70, hemoglobin A1c less than 7, blood pressure target less than 130/80 mmHg, >150 minutes of moderate aerobic activity per week, avoidance of smoking, weight control (via diet and exercise), and continued  surveillance/management of/for obstructive sleep apnea.    Medication Adjustments/Labs and Tests Ordered: Current medicines are reviewed at length with the patient today.  Concerns regarding medicines are outlined above.  No orders of the defined types were placed in this encounter.  No orders of the defined types were placed in this encounter.   There are no Patient Instructions on file for this visit.   Signed, Sinclair Grooms, MD  11/06/2020 9:36 AM    [place]

## 2020-11-06 ENCOUNTER — Other Ambulatory Visit: Payer: Self-pay

## 2020-11-06 ENCOUNTER — Encounter: Payer: Self-pay | Admitting: Interventional Cardiology

## 2020-11-06 ENCOUNTER — Ambulatory Visit (INDEPENDENT_AMBULATORY_CARE_PROVIDER_SITE_OTHER): Payer: Medicare HMO | Admitting: Interventional Cardiology

## 2020-11-06 VITALS — BP 143/82 | HR 71 | Temp 97.2°F | Ht 64.0 in | Wt 163.0 lb

## 2020-11-06 DIAGNOSIS — E1121 Type 2 diabetes mellitus with diabetic nephropathy: Secondary | ICD-10-CM

## 2020-11-06 DIAGNOSIS — I4510 Unspecified right bundle-branch block: Secondary | ICD-10-CM | POA: Diagnosis not present

## 2020-11-06 DIAGNOSIS — I2510 Atherosclerotic heart disease of native coronary artery without angina pectoris: Secondary | ICD-10-CM | POA: Diagnosis not present

## 2020-11-06 DIAGNOSIS — E782 Mixed hyperlipidemia: Secondary | ICD-10-CM

## 2020-11-06 DIAGNOSIS — Z7189 Other specified counseling: Secondary | ICD-10-CM

## 2020-11-06 NOTE — Patient Instructions (Signed)
Medication Instructions:  Your physician recommends that you continue on your current medications as directed. Please refer to the Current Medication list given to you today.  *If you need a refill on your cardiac medications before your next appointment, please call your pharmacy*   Lab Work: Lipid, Liver, BMET and A1C today  If you have labs (blood work) drawn today and your tests are completely normal, you will receive your results only by: Marland Kitchen MyChart Message (if you have MyChart) OR . A paper copy in the mail If you have any lab test that is abnormal or we need to change your treatment, we will call you to review the results.   Testing/Procedures: None   Follow-Up: At [facility], you and your health needs are our priority.  As part of our continuing mission to provide you with exceptional heart care, we have created designated Provider Care Teams.  These Care Teams include your primary Cardiologist (physician) and Advanced Practice Providers (APPs -  Physician Assistants and Nurse Practitioners) who all work together to provide you with the care you need, when you need it.  We recommend signing up for the patient portal called "MyChart".  Sign up information is provided on this After Visit Summary.  MyChart is used to connect with patients for Virtual Visits (Telemedicine).  Patients are able to view lab/test results, encounter notes, upcoming appointments, etc.  Non-urgent messages can be sent to your provider as well.   To learn more about what you can do with MyChart, go to [email or web address].    Your next appointment:   1 year(s)  The format for your next appointment:   In Person  Provider:   You may see Lesleigh Noe, MD or one of the following Advanced Practice Providers on your designated Care Team:    Norma Fredrickson, NP  Nada Boozer, NP  Georgie Chard, NP    Other Instructions

## 2020-11-07 LAB — HEPATIC FUNCTION PANEL
ALT: 18 IU/L (ref 0–32)
AST: 15 IU/L (ref 0–40)
Albumin: 4.7 g/dL (ref 3.7–4.7)
Alkaline Phosphatase: 74 IU/L (ref 44–121)
Bilirubin Total: 0.3 mg/dL (ref 0.0–1.2)
Bilirubin, Direct: <0.1 mg/dL (ref 0.00–0.40)
Total Protein: 7.3 g/dL (ref 6.0–8.5)

## 2020-11-07 LAB — BASIC METABOLIC PANEL
BUN/Creatinine Ratio: 16 (ref 12–28)
BUN: 15 mg/dL (ref 8–27)
CO2: 26 mmol/L (ref 20–29)
Calcium: 10.1 mg/dL (ref 8.7–10.3)
Chloride: 104 mmol/L (ref 96–106)
Creatinine, Ser: 0.94 mg/dL (ref 0.57–1.00)
GFR calc Af Amer: 68 mL/min/{1.73_m2} (ref >59)
GFR calc non Af Amer: 59 mL/min/{1.73_m2} — ABNORMAL LOW (ref >59)
Glucose: 95 mg/dL (ref 65–99)
Potassium: 4.2 mmol/L (ref 3.5–5.2)
Sodium: 142 mmol/L (ref 134–144)

## 2020-11-07 LAB — LIPID PANEL
Chol/HDL Ratio: 2.5 ratio (ref 0.0–4.4)
Cholesterol, Total: 162 mg/dL (ref 100–199)
HDL: 65 mg/dL (ref >39)
LDL Chol Calc (NIH): 80 mg/dL (ref 0–99)
Triglycerides: 93 mg/dL (ref 0–149)
VLDL Cholesterol Cal: 17 mg/dL (ref 5–40)

## 2020-11-07 LAB — HEMOGLOBIN A1C
Est. average glucose Bld gHb Est-mCnc: 134 mg/dL
Hgb A1c MFr Bld: 6.3 % — ABNORMAL HIGH (ref 4.8–5.6)

## 2021-01-20 ENCOUNTER — Encounter (INDEPENDENT_AMBULATORY_CARE_PROVIDER_SITE_OTHER): Payer: Medicare HMO | Admitting: Ophthalmology

## 2021-01-27 ENCOUNTER — Other Ambulatory Visit: Payer: Self-pay | Admitting: Internal Medicine

## 2021-01-28 LAB — COMPLETE METABOLIC PANEL WITH GFR
AG Ratio: 1.6 (calc) (ref 1.0–2.5)
ALT: 18 U/L (ref 6–29)
AST: 18 U/L (ref 10–35)
Albumin: 4.9 g/dL (ref 3.6–5.1)
Alkaline phosphatase (APISO): 61 U/L (ref 37–153)
BUN: 16 mg/dL (ref 7–25)
CO2: 27 mmol/L (ref 20–32)
Calcium: 10.1 mg/dL (ref 8.6–10.4)
Chloride: 105 mmol/L (ref 98–110)
Creat: 0.79 mg/dL (ref 0.60–0.93)
GFR, Est African American: 83 mL/min/{1.73_m2} (ref >=60)
GFR, Est Non African American: 72 mL/min/{1.73_m2} (ref >=60)
Globulin: 3.1 g/dL (calc) (ref 1.9–3.7)
Glucose, Bld: 90 mg/dL (ref 65–99)
Potassium: 4.2 mmol/L (ref 3.5–5.3)
Sodium: 141 mmol/L (ref 135–146)
Total Bilirubin: 0.4 mg/dL (ref 0.2–1.2)
Total Protein: 8 g/dL (ref 6.1–8.1)

## 2021-01-28 LAB — LIPID PANEL
Cholesterol: 143 mg/dL (ref <200)
HDL: 58 mg/dL (ref >=50)
LDL Cholesterol (Calc): 69 mg/dL (calc)
Non-HDL Cholesterol (Calc): 85 mg/dL (calc) (ref <130)
Total CHOL/HDL Ratio: 2.5 (calc) (ref <5.0)
Triglycerides: 84 mg/dL (ref <150)

## 2021-01-28 LAB — TSH: TSH: 1.12 mIU/L (ref 0.40–4.50)

## 2021-01-28 LAB — CBC
HCT: 39.9 % (ref 35.0–45.0)
Hemoglobin: 13.2 g/dL (ref 11.7–15.5)
MCH: 25.5 pg — ABNORMAL LOW (ref 27.0–33.0)
MCHC: 33.1 g/dL (ref 32.0–36.0)
MCV: 77 fL — ABNORMAL LOW (ref 80.0–100.0)
MPV: 12.9 fL — ABNORMAL HIGH (ref 7.5–12.5)
Platelets: 153 10*3/uL (ref 140–400)
RBC: 5.18 10*6/uL — ABNORMAL HIGH (ref 3.80–5.10)
RDW: 15.2 % — ABNORMAL HIGH (ref 11.0–15.0)
WBC: 6 10*3/uL (ref 3.8–10.8)

## 2021-01-28 LAB — VITAMIN D 25 HYDROXY (VIT D DEFICIENCY, FRACTURES): Vit D, 25-Hydroxy: 32 ng/mL (ref 30–100)

## 2021-01-28 LAB — URIC ACID: Uric Acid, Serum: 4.4 mg/dL (ref 2.5–7.0)

## 2021-02-03 ENCOUNTER — Encounter (INDEPENDENT_AMBULATORY_CARE_PROVIDER_SITE_OTHER): Payer: Medicare HMO | Admitting: Ophthalmology

## 2021-02-03 ENCOUNTER — Other Ambulatory Visit: Payer: Self-pay

## 2021-02-03 DIAGNOSIS — I10 Essential (primary) hypertension: Secondary | ICD-10-CM | POA: Diagnosis not present

## 2021-02-03 DIAGNOSIS — E113391 Type 2 diabetes mellitus with moderate nonproliferative diabetic retinopathy without macular edema, right eye: Secondary | ICD-10-CM

## 2021-02-03 DIAGNOSIS — H35033 Hypertensive retinopathy, bilateral: Secondary | ICD-10-CM

## 2021-02-03 DIAGNOSIS — E113292 Type 2 diabetes mellitus with mild nonproliferative diabetic retinopathy without macular edema, left eye: Secondary | ICD-10-CM | POA: Diagnosis not present

## 2021-02-03 DIAGNOSIS — H43813 Vitreous degeneration, bilateral: Secondary | ICD-10-CM

## 2021-06-30 ENCOUNTER — Encounter (INDEPENDENT_AMBULATORY_CARE_PROVIDER_SITE_OTHER): Payer: Medicare HMO | Admitting: Ophthalmology

## 2021-07-07 ENCOUNTER — Encounter (INDEPENDENT_AMBULATORY_CARE_PROVIDER_SITE_OTHER): Payer: Medicare HMO | Admitting: Ophthalmology

## 2021-07-07 ENCOUNTER — Other Ambulatory Visit: Payer: Self-pay

## 2021-07-07 DIAGNOSIS — H35033 Hypertensive retinopathy, bilateral: Secondary | ICD-10-CM | POA: Diagnosis not present

## 2021-07-07 DIAGNOSIS — E113311 Type 2 diabetes mellitus with moderate nonproliferative diabetic retinopathy with macular edema, right eye: Secondary | ICD-10-CM

## 2021-07-07 DIAGNOSIS — I10 Essential (primary) hypertension: Secondary | ICD-10-CM | POA: Diagnosis not present

## 2021-07-07 DIAGNOSIS — E113392 Type 2 diabetes mellitus with moderate nonproliferative diabetic retinopathy without macular edema, left eye: Secondary | ICD-10-CM | POA: Diagnosis not present

## 2021-07-07 DIAGNOSIS — H43813 Vitreous degeneration, bilateral: Secondary | ICD-10-CM

## 2021-07-09 ENCOUNTER — Encounter (INDEPENDENT_AMBULATORY_CARE_PROVIDER_SITE_OTHER): Payer: Medicare HMO | Admitting: Ophthalmology

## 2021-12-18 ENCOUNTER — Ambulatory Visit: Payer: Medicare HMO | Admitting: Interventional Cardiology

## 2022-01-07 ENCOUNTER — Encounter (INDEPENDENT_AMBULATORY_CARE_PROVIDER_SITE_OTHER): Payer: Medicare HMO | Admitting: Ophthalmology

## 2022-01-15 ENCOUNTER — Encounter (INDEPENDENT_AMBULATORY_CARE_PROVIDER_SITE_OTHER): Payer: Medicare HMO | Admitting: Ophthalmology

## 2022-01-20 ENCOUNTER — Other Ambulatory Visit: Payer: Self-pay

## 2022-01-20 ENCOUNTER — Encounter (INDEPENDENT_AMBULATORY_CARE_PROVIDER_SITE_OTHER): Payer: Medicare HMO | Admitting: Ophthalmology

## 2022-01-20 DIAGNOSIS — H43813 Vitreous degeneration, bilateral: Secondary | ICD-10-CM

## 2022-01-20 DIAGNOSIS — H35033 Hypertensive retinopathy, bilateral: Secondary | ICD-10-CM | POA: Diagnosis not present

## 2022-01-20 DIAGNOSIS — E113393 Type 2 diabetes mellitus with moderate nonproliferative diabetic retinopathy without macular edema, bilateral: Secondary | ICD-10-CM | POA: Diagnosis not present

## 2022-01-20 DIAGNOSIS — I10 Essential (primary) hypertension: Secondary | ICD-10-CM | POA: Diagnosis not present

## 2022-02-16 NOTE — Progress Notes (Signed)
Cardiology Office Note:    Date:  02/17/2022   ID:  [name], DOB 08-20-1941, MRN [id number]  PCP:  Nolene Ebbs, MD  Cardiologist:  Sinclair Grooms, MD   Referring MD: Nolene Ebbs, MD   Chief Complaint  Patient presents with   Coronary Artery Disease   Hypertension    History of Present Illness:    [name] is a 81 y.o. adult with a hx of CAD status post inferior STEMI in 03/2014, PCI with overlapping DES to the mid RCA, hypertension, and hyperlipidemia.   Doing well.  Very spiritual.  Doctor. Avbuere is her primary physician and her pastor via his church in [place], [place].  She denies angina, claudication, palpitations, syncope, dyspnea on exertion, orthopnea, lower extremity swelling.  Transient neurological symptoms.  No medication side effects.  Past Medical History:  Diagnosis Date   Arthritis    Coronary artery disease    a. Inf STEMI (03/2014) => LHC (03/25/14):  Ostial/prox RI 70-80%, mid RI 95%, mid RCA occluded (culprit).  EF 60% with inferobasal HK.  RI small in distribution and med Rx recommended.  PCI:  Overlapping Xience Alpine (2.5 x 12 mm and 3 x 8 mm) DES to mid RCA. // b. Myoview 6/18: EF 50, no ischemia; Low Risk    Hyperlipidemia    Hypertension    Microcytic anemia    ST elevation myocardial infarction (STEMI) of inferior wall ([place]) 03/25/2014   DES x2 RCA      Past Surgical History:  Procedure Laterality Date   CATARACT EXTRACTION     CORONARY ANGIOPLASTY WITH STENT PLACEMENT  03/25/2014   LAD irreg, RI 80%/95% (med Rx), RCA 100%-->0 w/ overlapping DES x 2, EF OK.   LEFT HEART CATH N/A 03/25/2014   Procedure: LEFT HEART CATH;  Surgeon: Sinclair Grooms, MD;  Location: [facility] CATH LAB;  Service: Cardiovascular;  Laterality: N/A;   PERCUTANEOUS CORONARY STENT INTERVENTION (PCI-S)  03/25/2014   Procedure: PERCUTANEOUS CORONARY STENT INTERVENTION (PCI-S);  Surgeon: Sinclair Grooms, MD;  Location: [facility] CATH LAB;  Service:  Cardiovascular;;    Current Medications: Current Meds  Medication Sig   allopurinol (ZYLOPRIM) 100 MG tablet    Apoaequorin (PREVAGEN PO) Take by mouth daily.   ASPIRIN LOW DOSE 81 MG EC tablet TAKE 1 TABLET BY MOUTH EVERY DAY   Blood Glucose Monitoring Suppl (TRUE METRIX AIR GLUCOSE METER) w/Device KIT    COLCRYS 0.6 MG tablet Take 0.6 mg by mouth daily.   diclofenac Sodium (VOLTAREN) 1 % GEL Apply topically as needed.   hydrochlorothiazide (HYDRODIURIL) 25 MG tablet Take 1 tablet (25 mg total) by mouth daily.   hydrOXYzine (ATARAX/VISTARIL) 25 MG tablet Take 25 mg by mouth as needed for itching.    latanoprost (XALATAN) 0.005 % ophthalmic solution Place 1 drop into both eyes at bedtime.   lisinopril (ZESTRIL) 20 MG tablet Take 1 tablet (20 mg total) by mouth daily.   metFORMIN (GLUCOPHAGE) 500 MG tablet Take 500 mg by mouth daily.   metoprolol succinate (TOPROL-XL) 50 MG 24 hr tablet TAKE 1 TABLET (50 MG TOTAL) BY MOUTH DAILY.   nitroGLYCERIN (NITROSTAT) 0.4 MG SL tablet Place 1 tablet (0.4 mg total) under the tongue every 5 (five) minutes as needed for chest pain.   pantoprazole (PROTONIX) 40 MG tablet Take 40 mg by mouth daily as needed. Acid reflux   rosuvastatin (CRESTOR) 40 MG tablet Take 1 tablet (40 mg total) by  mouth daily.   TRUE METRIX BLOOD GLUCOSE TEST test strip    TRUEPLUS LANCETS 33G MISC    VOLTAREN 1 % GEL Apply topically as directed.      Allergies:   Patient has no known allergies.   Social History   Socioeconomic History   Marital status: Single    Spouse name: Not on file   Number of children: Not on file   Years of education: Not on file   Highest education level: Not on file  Occupational History   Not on file  Tobacco Use   Smoking status: Never   Smokeless tobacco: Never  Vaping Use   Vaping Use: Never used  Substance and Sexual Activity   Alcohol use: No    Alcohol/week: 0.0 standard drinks   Drug use: No   Sexual activity: Not Currently   Other Topics Concern   Not on file  Social History Narrative   Not on file   Social Determinants of Health   Financial Resource Strain: Not on file  Food Insecurity: Not on file  Transportation Needs: Not on file  Physical Activity: Not on file  Stress: Not on file  Social Connections: Not on file     Family History: The patient's family history includes Stroke in his sister.  ROS:   Please see the history of present illness.    Bilateral knee arthritis.  All other systems reviewed and are negative.  EKGs/Labs/Other Studies Reviewed:    The following studies were reviewed today: NUCLEAR MEDICINE STRESS SCINTIGRAPHY 06/02/19 Study Highlights  Nuclear stress EF: 50%. There was no ST segment deviation noted during stress. The study is normal. No ischemia. This is a low risk study. The left ventricular ejection fraction is mildly decreased (45-54%).  EKG:  EKG normal sinus rhythm, incomplete right bundle.  Compared to prior tracing performed on 11/06/2020, heart rate is faster.  Recent Labs: No results found for requested labs within last 8760 hours.  Recent Lipid Panel    Component Value Date/Time   CHOL 143 01/27/2021 0000   CHOL 162 11/06/2020 0948   TRIG 84 01/27/2021 0000   HDL 58 01/27/2021 0000   HDL 65 11/06/2020 0948   CHOLHDL 2.5 01/27/2021 0000   VLDL 17.8 03/07/2015 0911   LDLCALC 69 01/27/2021 0000    Physical Exam:    VS:  BP 118/70    Pulse 83    Ht 5' 4" (1.626 m)    Wt 155 lb (70.3 kg)    SpO2 98%    BMI 26.61 kg/m     Wt Readings from Last 3 Encounters:  02/17/22 155 lb (70.3 kg)  11/06/20 163 lb (73.9 kg)  10/20/19 158 lb (71.7 kg)     GEN: Overweight but not obese.. No acute distress HEENT: Normal NECK: No JVD. LYMPHATICS: No lymphadenopathy CARDIAC: No murmur. RRR no gallop, or edema. VASCULAR:  Normal Pulses. No bruits. RESPIRATORY:  Clear to auscultation without rales, wheezing or rhonchi  ABDOMEN: Soft, non-tender,  non-distended, No pulsatile mass, MUSCULOSKELETAL: No deformity  SKIN: Warm and dry NEUROLOGIC:  Alert and oriented x 3 PSYCHIATRIC:  Normal affect   ASSESSMENT:    1. Coronary artery disease involving native coronary artery of native heart without angina pectoris   2. Mixed hyperlipidemia   3. Controlled type 2 diabetes mellitus with diabetic nephropathy, without long-term current use of insulin ([place])   4. Right bundle branch block    PLAN:    In order of problems  listed above:  Secondary prevention reviewed.  Encouraged physical activity.  Some limitations due to orthopedic issues. Continue Crestor 40 mg/day Continue Glucophage and consider SGLT2 therapy in the future if dyspnea or evidence of volume overload. Unchanged.  Overall education and awareness concerning secondary risk prevention was discussed in detail: LDL less than 70, hemoglobin A1c less than 7, blood pressure target less than 130/80 mmHg, >150 minutes of moderate aerobic activity per week, avoidance of smoking, weight control (via diet and exercise), and continued surveillance/management of/for obstructive sleep apnea.  He prefers to have laboratory data followed by Dr. Jeanie Cooks.  Medication Adjustments/Labs and Tests Ordered: Current medicines are reviewed at length with the patient today.  Concerns regarding medicines are outlined above.  Orders Placed This Encounter  Procedures   EKG 12-Lead   No orders of the defined types were placed in this encounter.   Patient Instructions  Medication Instructions:  Your physician recommends that you continue on your current medications as directed. Please refer to the Current Medication list given to you today.  *If you need a refill on your cardiac medications before your next appointment, please call your pharmacy*   Lab Work: None If you have labs (blood work) drawn today and your tests are completely normal, you will receive your results only by: [place] (if you have MyChart) OR A paper copy in the mail If you have any lab test that is abnormal or we need to change your treatment, we will call you to review the results.   Testing/Procedures: None   Follow-Up: At [facility], you and your health needs are our priority.  As part of our continuing mission to provide you with exceptional heart care, we have created designated Provider Care Teams.  These Care Teams include your primary Cardiologist (physician) and Advanced Practice Providers (APPs -  Physician Assistants and Nurse Practitioners) who all work together to provide you with the care you need, when you need it.  We recommend signing up for the patient portal called "MyChart".  Sign up information is provided on this After Visit Summary.  MyChart is used to connect with patients for Virtual Visits (Telemedicine).  Patients are able to view lab/test results, encounter notes, upcoming appointments, etc.  Non-urgent messages can be sent to your provider as well.   To learn more about what you can do with MyChart, go to [email or web address].    Your next appointment:   1 year(s)  The format for your next appointment:   In Person  Provider:   Sinclair Grooms, MD     Other Instructions     Signed, Sinclair Grooms, MD  02/17/2022 10:46 AM    [place]

## 2022-02-17 ENCOUNTER — Ambulatory Visit (INDEPENDENT_AMBULATORY_CARE_PROVIDER_SITE_OTHER): Payer: Medicare HMO | Admitting: Interventional Cardiology

## 2022-02-17 ENCOUNTER — Other Ambulatory Visit: Payer: Self-pay

## 2022-02-17 ENCOUNTER — Encounter: Payer: Self-pay | Admitting: Interventional Cardiology

## 2022-02-17 VITALS — BP 118/70 | HR 83 | Ht 64.0 in | Wt 155.0 lb

## 2022-02-17 DIAGNOSIS — I2510 Atherosclerotic heart disease of native coronary artery without angina pectoris: Secondary | ICD-10-CM

## 2022-02-17 DIAGNOSIS — I4510 Unspecified right bundle-branch block: Secondary | ICD-10-CM

## 2022-02-17 DIAGNOSIS — E1121 Type 2 diabetes mellitus with diabetic nephropathy: Secondary | ICD-10-CM

## 2022-02-17 DIAGNOSIS — E782 Mixed hyperlipidemia: Secondary | ICD-10-CM

## 2022-02-17 NOTE — Patient Instructions (Signed)

## 2022-02-24 ENCOUNTER — Other Ambulatory Visit: Payer: Self-pay | Admitting: Internal Medicine

## 2022-02-24 LAB — CBC
HCT: 41.4 % (ref 35.0–45.0)
Hemoglobin: 13.4 g/dL (ref 11.7–15.5)
MCH: 25.4 pg — ABNORMAL LOW (ref 27.0–33.0)
MCHC: 32.4 g/dL (ref 32.0–36.0)
MCV: 78.4 fL — ABNORMAL LOW (ref 80.0–100.0)
MPV: 13 fL — ABNORMAL HIGH (ref 7.5–12.5)
Platelets: 162 10*3/uL (ref 140–400)
RBC: 5.28 10*6/uL — ABNORMAL HIGH (ref 3.80–5.10)
RDW: 15.2 % — ABNORMAL HIGH (ref 11.0–15.0)
WBC: 4.6 10*3/uL (ref 3.8–10.8)

## 2022-02-24 LAB — COMPLETE METABOLIC PANEL WITH GFR
AG Ratio: 1.5 (calc) (ref 1.0–2.5)
ALT: 14 U/L (ref 6–29)
AST: 17 U/L (ref 10–35)
Albumin: 4.7 g/dL (ref 3.6–5.1)
Alkaline phosphatase (APISO): 63 U/L (ref 37–153)
BUN: 12 mg/dL (ref 7–25)
CO2: 25 mmol/L (ref 20–32)
Calcium: 9.9 mg/dL (ref 8.6–10.4)
Chloride: 102 mmol/L (ref 98–110)
Creat: 0.83 mg/dL (ref 0.60–1.00)
Globulin: 3.1 g/dL (calc) (ref 1.9–3.7)
Glucose, Bld: 74 mg/dL (ref 65–99)
Potassium: 4.5 mmol/L (ref 3.5–5.3)
Sodium: 139 mmol/L (ref 135–146)
Total Bilirubin: 0.6 mg/dL (ref 0.2–1.2)
Total Protein: 7.8 g/dL (ref 6.1–8.1)
eGFR: 72 mL/min/{1.73_m2} (ref >=60)

## 2022-02-24 LAB — LIPID PANEL
Cholesterol: 214 mg/dL — ABNORMAL HIGH (ref <200)
HDL: 76 mg/dL (ref >=50)
LDL Cholesterol (Calc): 116 mg/dL (calc) — ABNORMAL HIGH
Non-HDL Cholesterol (Calc): 138 mg/dL (calc) — ABNORMAL HIGH (ref <130)
Total CHOL/HDL Ratio: 2.8 (calc) (ref <5.0)
Triglycerides: 117 mg/dL (ref <150)

## 2022-02-24 LAB — URIC ACID: Uric Acid, Serum: 5.2 mg/dL (ref 2.5–7.0)

## 2022-02-24 LAB — TSH: TSH: 1.32 mIU/L (ref 0.40–4.50)

## 2022-06-23 DIAGNOSIS — H5203 Hypermetropia, bilateral: Secondary | ICD-10-CM | POA: Diagnosis not present

## 2022-06-23 DIAGNOSIS — Z0100 Encounter for examination of eyes and vision without abnormal findings: Secondary | ICD-10-CM | POA: Diagnosis not present

## 2022-07-27 ENCOUNTER — Encounter (INDEPENDENT_AMBULATORY_CARE_PROVIDER_SITE_OTHER): Payer: Medicare HMO | Admitting: Ophthalmology

## 2022-08-04 ENCOUNTER — Encounter (INDEPENDENT_AMBULATORY_CARE_PROVIDER_SITE_OTHER): Payer: Medicare HMO | Admitting: Ophthalmology

## 2022-08-26 DIAGNOSIS — I10 Essential (primary) hypertension: Secondary | ICD-10-CM | POA: Diagnosis not present

## 2022-08-26 DIAGNOSIS — Z23 Encounter for immunization: Secondary | ICD-10-CM | POA: Diagnosis not present

## 2022-08-26 DIAGNOSIS — M179 Osteoarthritis of knee, unspecified: Secondary | ICD-10-CM | POA: Diagnosis not present

## 2022-08-26 DIAGNOSIS — E113293 Type 2 diabetes mellitus with mild nonproliferative diabetic retinopathy without macular edema, bilateral: Secondary | ICD-10-CM | POA: Diagnosis not present

## 2022-08-31 ENCOUNTER — Encounter (INDEPENDENT_AMBULATORY_CARE_PROVIDER_SITE_OTHER): Payer: Medicare HMO | Admitting: Ophthalmology

## 2022-08-31 DIAGNOSIS — H35033 Hypertensive retinopathy, bilateral: Secondary | ICD-10-CM

## 2022-08-31 DIAGNOSIS — E113293 Type 2 diabetes mellitus with mild nonproliferative diabetic retinopathy without macular edema, bilateral: Secondary | ICD-10-CM

## 2022-08-31 DIAGNOSIS — I10 Essential (primary) hypertension: Secondary | ICD-10-CM | POA: Diagnosis not present

## 2022-08-31 DIAGNOSIS — H43813 Vitreous degeneration, bilateral: Secondary | ICD-10-CM | POA: Diagnosis not present

## 2022-09-12 ENCOUNTER — Ambulatory Visit
Admission: RE | Admit: 2022-09-12 | Discharge: 2022-09-12 | Disposition: A | Discharge status: Home / Self Care | Payer: Medicare HMO | Source: Ambulatory Visit | Attending: Internal Medicine | Admitting: Internal Medicine

## 2022-09-12 ENCOUNTER — Other Ambulatory Visit: Payer: Self-pay | Admitting: Internal Medicine

## 2022-09-12 DIAGNOSIS — Z1231 Encounter for screening mammogram for malignant neoplasm of breast: Secondary | ICD-10-CM

## 2022-09-23 ENCOUNTER — Encounter (HOSPITAL_COMMUNITY): Payer: Self-pay | Admitting: Emergency Medicine

## 2022-09-23 ENCOUNTER — Ambulatory Visit (HOSPITAL_COMMUNITY)
Admission: EM | Admit: 2022-09-23 | Discharge: 2022-09-23 | Disposition: A | Discharge status: Home / Self Care | Payer: Medicare HMO | Attending: Family Medicine | Admitting: Family Medicine

## 2022-09-23 ENCOUNTER — Ambulatory Visit (INDEPENDENT_AMBULATORY_CARE_PROVIDER_SITE_OTHER): Payer: Medicare HMO

## 2022-09-23 DIAGNOSIS — R079 Chest pain, unspecified: Secondary | ICD-10-CM | POA: Diagnosis not present

## 2022-09-23 DIAGNOSIS — R0789 Other chest pain: Secondary | ICD-10-CM

## 2022-09-23 MED ORDER — BACLOFEN 5 MG PO TABS: 5.0000 mg | ORAL_TABLET | Freq: Every evening | ORAL | 0 refills | Status: DC | PRN
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
## 2022-11-24 DIAGNOSIS — H401113 Primary open-angle glaucoma, right eye, severe stage: Secondary | ICD-10-CM | POA: Diagnosis not present

## 2022-12-23 ENCOUNTER — Ambulatory Visit: Payer: Medicare HMO | Attending: Cardiology | Admitting: Cardiology

## 2022-12-23 ENCOUNTER — Encounter: Payer: Self-pay | Admitting: Cardiology

## 2022-12-23 VITALS — BP 120/60 | HR 89 | Ht 64.0 in | Wt 151.2 lb

## 2022-12-23 DIAGNOSIS — E782 Mixed hyperlipidemia: Secondary | ICD-10-CM | POA: Diagnosis not present

## 2022-12-23 DIAGNOSIS — I2510 Atherosclerotic heart disease of native coronary artery without angina pectoris: Secondary | ICD-10-CM

## 2022-12-23 DIAGNOSIS — I10 Essential (primary) hypertension: Secondary | ICD-10-CM | POA: Diagnosis not present

## 2022-12-23 MED ORDER — ROSUVASTATIN CALCIUM 40 MG PO TABS
40.0000 mg | ORAL_TABLET | Freq: Every day | ORAL | 3 refills | Status: AC
Start: 2022-12-23 — End: ?

## 2022-12-23 NOTE — Patient Instructions (Signed)
Medication Instructions:  Your physician recommends that you continue on your current medications as directed. Please refer to the Current Medication list given to you today.  *If you need a refill on your cardiac medications before your next appointment, please call your pharmacy*   Lab Work: NONE If you have labs (blood work) drawn today and your tests are completely normal, you will receive your results only by: MyChart Message (if you have MyChart) OR A paper copy in the mail If you have any lab test that is abnormal or we need to change your treatment, we will call you to review the results.   Testing/Procedures: NONE   Follow-Up: At [facility], you and your health needs are our priority.  As part of our continuing mission to provide you with exceptional heart care, we have created designated Provider Care Teams.  These Care Teams include your primary Cardiologist (physician) and Advanced Practice Providers (APPs -  Physician Assistants and Nurse Practitioners) who all work together to provide you with the care you need, when you need it.  We recommend signing up for the patient portal called "MyChart".  Sign up information is provided on this After Visit Summary.  MyChart is used to connect with patients for Virtual Visits (Telemedicine).  Patients are able to view lab/test results, encounter notes, upcoming appointments, etc.  Non-urgent messages can be sent to your provider as well.   To learn more about what you can do with MyChart, go to [email or web address].    Your next appointment:   1 year(s)  The format for your next appointment:   In Person  Provider:   Kardie Tobb, DO   

## 2022-12-23 NOTE — Progress Notes (Signed)
Cardiology Office Note:    Date:  12/23/2022   ID:  [name], DOB 1941-05-15, MRN [id number]  PCP:  Nolene Ebbs, MD  Cardiologist:  Berniece Salines, DO  Electrophysiologist:  None   Referring MD: Nolene Ebbs, MD   " I am doing well"  History of Present Illness:    [name] is a 82 y.o. adult with a hx of  CAD status post inferior STEMI in 03/2014, PCI with overlapping DES to the mid RCA, hypertension, and hyperlipidemia.   Since she saw Dr. Tamala Julian which was about close to a year ago she has been doing well.  She has not had any issues from a cardiovascular standpoint.  She has not had any hospitalizations.  She shares with me that she lives with her daughter.  Doctor. Avbuere is her primary physician and her pastor via his church in [place], [place].   Past Medical History:  Diagnosis Date   Arthritis    Coronary artery disease    a. Inf STEMI (03/2014) => LHC (03/25/14):  Ostial/prox RI 70-80%, mid RI 95%, mid RCA occluded (culprit).  EF 60% with inferobasal HK.  RI small in distribution and med Rx recommended.  PCI:  Overlapping Xience Alpine (2.5 x 12 mm and 3 x 8 mm) DES to mid RCA. // b. Myoview 6/18: EF 50, no ischemia; Low Risk    Hyperlipidemia    Hypertension    Microcytic anemia    ST elevation myocardial infarction (STEMI) of inferior wall ([place]) 03/25/2014   DES x2 RCA      Past Surgical History:  Procedure Laterality Date   BREAST BIOPSY Right 07/27/2011   CATARACT EXTRACTION     CORONARY ANGIOPLASTY WITH STENT PLACEMENT  03/25/2014   LAD irreg, RI 80%/95% (med Rx), RCA 100%-->0 w/ overlapping DES x 2, EF OK.   LEFT HEART CATH N/A 03/25/2014   Procedure: LEFT HEART CATH;  Surgeon: Sinclair Grooms, MD;  Location: [facility] CATH LAB;  Service: Cardiovascular;  Laterality: N/A;   PERCUTANEOUS CORONARY STENT INTERVENTION (PCI-S)  03/25/2014   Procedure: PERCUTANEOUS CORONARY STENT INTERVENTION (PCI-S);  Surgeon: Sinclair Grooms, MD;  Location: [facility] CATH  LAB;  Service: Cardiovascular;;    Current Medications: Current Meds  Medication Sig   allopurinol (ZYLOPRIM) 100 MG tablet    Apoaequorin (PREVAGEN PO) Take by mouth daily.   ASPIRIN LOW DOSE 81 MG EC tablet TAKE 1 TABLET BY MOUTH EVERY DAY   Baclofen 5 MG TABS Take 5 mg by mouth at bedtime as needed.   Blood Glucose Monitoring Suppl (TRUE METRIX AIR GLUCOSE METER) w/Device KIT    COLCRYS 0.6 MG tablet Take 0.6 mg by mouth daily.   diclofenac Sodium (VOLTAREN) 1 % GEL Apply topically as needed.   hydrochlorothiazide (HYDRODIURIL) 25 MG tablet Take 1 tablet (25 mg total) by mouth daily.   hydrOXYzine (ATARAX/VISTARIL) 25 MG tablet Take 25 mg by mouth as needed for itching.    latanoprost (XALATAN) 0.005 % ophthalmic solution Place 1 drop into both eyes at bedtime.   lisinopril (ZESTRIL) 20 MG tablet Take 1 tablet (20 mg total) by mouth daily.   metFORMIN (GLUCOPHAGE) 500 MG tablet Take 500 mg by mouth daily.   metoprolol succinate (TOPROL-XL) 50 MG 24 hr tablet TAKE 1 TABLET (50 MG TOTAL) BY MOUTH DAILY.   nitroGLYCERIN (NITROSTAT) 0.4 MG SL tablet Place 1 tablet (0.4 mg total) under the tongue every 5 (five) minutes as needed for chest  pain.   pantoprazole (PROTONIX) 40 MG tablet Take 40 mg by mouth daily as needed. Acid reflux   TRUE METRIX BLOOD GLUCOSE TEST test strip    TRUEPLUS LANCETS 33G MISC    VOLTAREN 1 % GEL Apply topically as directed.    [DISCONTINUED] rosuvastatin (CRESTOR) 40 MG tablet Take 1 tablet (40 mg total) by mouth daily.     Allergies:   Patient has no known allergies.   Social History   Socioeconomic History   Marital status: Single    Spouse name: Not on file   Number of children: Not on file   Years of education: Not on file   Highest education level: Not on file  Occupational History   Not on file  Tobacco Use   Smoking status: Never   Smokeless tobacco: Never  Vaping Use   Vaping Use: Never used  Substance and Sexual Activity   Alcohol use: No     Alcohol/week: 0.0 standard drinks of alcohol   Drug use: No   Sexual activity: Not Currently  Other Topics Concern   Not on file  Social History Narrative   Not on file   Social Determinants of Health   Financial Resource Strain: Not on file  Food Insecurity: Not on file  Transportation Needs: Not on file  Physical Activity: Not on file  Stress: Not on file  Social Connections: Not on file     Family History: The patient's family history includes Stroke in his sister.  ROS:   Review of Systems  Constitution: Negative for decreased appetite, fever and weight gain.  HENT: Negative for congestion, ear discharge, hoarse voice and sore throat.   Eyes: Negative for discharge, redness, vision loss in right eye and visual halos.  Cardiovascular: Negative for chest pain, dyspnea on exertion, leg swelling, orthopnea and palpitations.  Respiratory: Negative for cough, hemoptysis, shortness of breath and snoring.   Endocrine: Negative for heat intolerance and polyphagia.  Hematologic/Lymphatic: Negative for bleeding problem. Does not bruise/bleed easily.  Skin: Negative for flushing, nail changes, rash and suspicious lesions.  Musculoskeletal: Negative for arthritis, joint pain, muscle cramps, myalgias, neck pain and stiffness.  Gastrointestinal: Negative for abdominal pain, bowel incontinence, diarrhea and excessive appetite.  Genitourinary: Negative for decreased libido, genital sores and incomplete emptying.  Neurological: Negative for brief paralysis, focal weakness, headaches and loss of balance.  Psychiatric/Behavioral: Negative for altered mental status, depression and suicidal ideas.  Allergic/Immunologic: Negative for HIV exposure and persistent infections.    EKGs/Labs/Other Studies Reviewed:    The following studies were reviewed today:   EKG:  The ekg ordered today demonstrates   Recent Labs: 02/24/2022: ALT 14; BUN 12; Creat 0.83; Hemoglobin 13.4; Platelets 162;  Potassium 4.5; Sodium 139; TSH 1.32  Recent Lipid Panel    Component Value Date/Time   CHOL 214 (H) 02/24/2022 0000   CHOL 162 11/06/2020 0948   TRIG 117 02/24/2022 0000   HDL 76 02/24/2022 0000   HDL 65 11/06/2020 0948   CHOLHDL 2.8 02/24/2022 0000   VLDL 17.8 03/07/2015 0911   LDLCALC 116 (H) 02/24/2022 0000    Physical Exam:    VS:  BP 120/60   Pulse 89   Ht 5\' 4"  (1.626 m)   Wt 68.6 kg   SpO2 99%   BMI 25.95 kg/m     Wt Readings from Last 3 Encounters:  12/23/22 68.6 kg  02/17/22 70.3 kg  11/06/20 73.9 kg     GEN: Well nourished, well developed  in no acute distress HEENT: Normal NECK: No JVD; No carotid bruits LYMPHATICS: No lymphadenopathy CARDIAC: S1S2 noted,RRR, no murmurs, rubs, gallops RESPIRATORY:  Clear to auscultation without rales, wheezing or rhonchi  ABDOMEN: Soft, non-tender, non-distended, +bowel sounds, no guarding. EXTREMITIES: No edema, No cyanosis, no clubbing MUSCULOSKELETAL:  No deformity  SKIN: Warm and dry NEUROLOGIC:  Alert and oriented x 3, non-focal PSYCHIATRIC:  Normal affect, good insight  ASSESSMENT:    1. Primary hypertension   2. Coronary artery disease involving native coronary artery of native heart without angina pectoris   3. Mixed hyperlipidemia    PLAN:    CAD- no anginal symptoms.  Hyperlipidemia-continue crestor Diabetes mellitus-managed by her pcp. Clinically appears to be euvolemic.   The patient is in agreement with the above plan. The patient left the office in stable condition.  The patient will follow up in 1 year    Medication Adjustments/Labs and Tests Ordered: Current medicines are reviewed at length with the patient today.  Concerns regarding medicines are outlined above.  No orders of the defined types were placed in this encounter.  Meds ordered this encounter  Medications   rosuvastatin (CRESTOR) 40 MG tablet    Sig: Take 1 tablet (40 mg total) by mouth daily.    Dispense:  90 tablet    Refill:   3    D/c Atorvastatin.  Please place note on prescription reminding pt to stop Atorvastatin.    Patient Instructions  Medication Instructions:  Your physician recommends that you continue on your current medications as directed. Please refer to the Current Medication list given to you today.  *If you need a refill on your cardiac medications before your next appointment, please call your pharmacy*   Lab Work: NONE If you have labs (blood work) drawn today and your tests are completely normal, you will receive your results only by: [place] (if you have MyChart) OR A paper copy in the mail If you have any lab test that is abnormal or we need to change your treatment, we will call you to review the results.   Testing/Procedures: NONE   Follow-Up: At [facility], you and your health needs are our priority.  As part of our continuing mission to provide you with exceptional heart care, we have created designated Provider Care Teams.  These Care Teams include your primary Cardiologist (physician) and Advanced Practice Providers (APPs -  Physician Assistants and Nurse Practitioners) who all work together to provide you with the care you need, when you need it.  We recommend signing up for the patient portal called "MyChart".  Sign up information is provided on this After Visit Summary.  MyChart is used to connect with patients for Virtual Visits (Telemedicine).  Patients are able to view lab/test results, encounter notes, upcoming appointments, etc.  Non-urgent messages can be sent to your provider as well.   To learn more about what you can do with MyChart, go to [email or web address].    Your next appointment:   1 year(s)  The format for your next appointment:   In Person  Provider:   Berniece Salines, DO     Adopting a Healthy Lifestyle.  Know what a healthy weight is for you (roughly BMI <25) and aim to maintain this   Aim for 7+ servings of fruits and vegetables  daily   65-80+ fluid ounces of water or unsweet tea for healthy kidneys   Limit to max 1 drink of alcohol per day; avoid smoking/tobacco  Limit animal fats in diet for cholesterol and heart health - choose grass fed whenever available   Avoid highly processed foods, and foods high in saturated/trans fats   Aim for low stress - take time to unwind and care for your mental health   Aim for 150 min of moderate intensity exercise weekly for heart health, and weights twice weekly for bone health   Aim for 7-9 hours of sleep daily   When it comes to diets, agreement about the perfect plan isnt easy to find, even among the experts. Experts at the [employer] developed an idea known as the Healthy Eating Plate. Just imagine a plate divided into logical, healthy portions.   The emphasis is on diet quality:   Load up on vegetables and fruits - one-half of your plate: Aim for color and variety, and remember that potatoes dont count.   Go for whole grains - one-quarter of your plate: Whole wheat, barley, wheat berries, quinoa, oats, brown rice, and foods made with them. If you want pasta, go with whole wheat pasta.   Protein power - one-quarter of your plate: Fish, chicken, beans, and nuts are all healthy, versatile protein sources. Limit red meat.   The diet, however, does go beyond the plate, offering a few other suggestions.   Use healthy plant oils, such as olive, canola, soy, corn, sunflower and peanut. Check the labels, and avoid partially hydrogenated oil, which have unhealthy trans fats.   If youre thirsty, drink water. Coffee and tea are good in moderation, but skip sugary drinks and limit milk and dairy products to one or two daily servings.   The type of carbohydrate in the diet is more important than the amount. Some sources of carbohydrates, such as vegetables, fruits, whole grains, and beans-are healthier than others.   Finally, stay  active  Signed, Berniece Salines, DO  12/23/2022 9:25 PM    [facility]

## 2023-01-04 DIAGNOSIS — M179 Osteoarthritis of knee, unspecified: Secondary | ICD-10-CM | POA: Diagnosis not present

## 2023-01-04 DIAGNOSIS — I10 Essential (primary) hypertension: Secondary | ICD-10-CM | POA: Diagnosis not present

## 2023-01-04 DIAGNOSIS — E113293 Type 2 diabetes mellitus with mild nonproliferative diabetic retinopathy without macular edema, bilateral: Secondary | ICD-10-CM | POA: Diagnosis not present

## 2023-01-04 DIAGNOSIS — H409 Unspecified glaucoma: Secondary | ICD-10-CM | POA: Diagnosis not present

## 2023-02-23 NOTE — Addendum Note (Signed)
Encounter addended by: Chipper Oman, RT on: 02/23/2023 1:47 PM  Actions taken: Imaging Exam ended

## 2023-03-01 ENCOUNTER — Encounter (INDEPENDENT_AMBULATORY_CARE_PROVIDER_SITE_OTHER): Payer: Medicare HMO | Admitting: Ophthalmology

## 2023-03-06 ENCOUNTER — Inpatient Hospital Stay (HOSPITAL_COMMUNITY)
Admission: EM | Admit: 2023-03-06 | Discharge: 2023-03-09 | DRG: 522 | Disposition: A | Discharge status: Home Health | Payer: Medicare HMO | Attending: Internal Medicine | Admitting: Internal Medicine

## 2023-03-06 ENCOUNTER — Encounter (HOSPITAL_COMMUNITY): Payer: Self-pay

## 2023-03-06 ENCOUNTER — Emergency Department (HOSPITAL_COMMUNITY): Payer: Medicare HMO

## 2023-03-06 ENCOUNTER — Other Ambulatory Visit: Payer: Self-pay

## 2023-03-06 DIAGNOSIS — E119 Type 2 diabetes mellitus without complications: Secondary | ICD-10-CM

## 2023-03-06 DIAGNOSIS — M109 Gout, unspecified: Secondary | ICD-10-CM | POA: Diagnosis present

## 2023-03-06 DIAGNOSIS — I959 Hypotension, unspecified: Secondary | ICD-10-CM | POA: Insufficient documentation

## 2023-03-06 DIAGNOSIS — D509 Iron deficiency anemia, unspecified: Secondary | ICD-10-CM | POA: Diagnosis present

## 2023-03-06 DIAGNOSIS — S72001A Fracture of unspecified part of neck of right femur, initial encounter for closed fracture: Principal | ICD-10-CM | POA: Diagnosis present

## 2023-03-06 DIAGNOSIS — D72829 Elevated white blood cell count, unspecified: Secondary | ICD-10-CM | POA: Diagnosis present

## 2023-03-06 DIAGNOSIS — Y92008 Other place in unspecified non-institutional (private) residence as the place of occurrence of the external cause: Secondary | ICD-10-CM | POA: Diagnosis not present

## 2023-03-06 DIAGNOSIS — Z955 Presence of coronary angioplasty implant and graft: Secondary | ICD-10-CM | POA: Diagnosis not present

## 2023-03-06 DIAGNOSIS — E876 Hypokalemia: Secondary | ICD-10-CM | POA: Insufficient documentation

## 2023-03-06 DIAGNOSIS — E785 Hyperlipidemia, unspecified: Secondary | ICD-10-CM | POA: Diagnosis present

## 2023-03-06 DIAGNOSIS — Z7982 Long term (current) use of aspirin: Secondary | ICD-10-CM | POA: Diagnosis not present

## 2023-03-06 DIAGNOSIS — I10 Essential (primary) hypertension: Secondary | ICD-10-CM | POA: Diagnosis not present

## 2023-03-06 DIAGNOSIS — S72041A Displaced fracture of base of neck of right femur, initial encounter for closed fracture: Secondary | ICD-10-CM | POA: Diagnosis not present

## 2023-03-06 DIAGNOSIS — Y9301 Activity, walking, marching and hiking: Secondary | ICD-10-CM | POA: Diagnosis present

## 2023-03-06 DIAGNOSIS — I471 Supraventricular tachycardia, unspecified: Secondary | ICD-10-CM | POA: Diagnosis not present

## 2023-03-06 DIAGNOSIS — Z8673 Personal history of transient ischemic attack (TIA), and cerebral infarction without residual deficits: Secondary | ICD-10-CM

## 2023-03-06 DIAGNOSIS — I252 Old myocardial infarction: Secondary | ICD-10-CM | POA: Diagnosis not present

## 2023-03-06 DIAGNOSIS — I2510 Atherosclerotic heart disease of native coronary artery without angina pectoris: Secondary | ICD-10-CM | POA: Diagnosis not present

## 2023-03-06 DIAGNOSIS — Z823 Family history of stroke: Secondary | ICD-10-CM | POA: Diagnosis not present

## 2023-03-06 DIAGNOSIS — I4510 Unspecified right bundle-branch block: Secondary | ICD-10-CM | POA: Diagnosis present

## 2023-03-06 DIAGNOSIS — Z043 Encounter for examination and observation following other accident: Secondary | ICD-10-CM | POA: Diagnosis not present

## 2023-03-06 DIAGNOSIS — S0990XA Unspecified injury of head, initial encounter: Secondary | ICD-10-CM | POA: Diagnosis not present

## 2023-03-06 DIAGNOSIS — Z79899 Other long term (current) drug therapy: Secondary | ICD-10-CM | POA: Diagnosis not present

## 2023-03-06 DIAGNOSIS — D62 Acute posthemorrhagic anemia: Secondary | ICD-10-CM | POA: Diagnosis not present

## 2023-03-06 DIAGNOSIS — W108XXA Fall (on) (from) other stairs and steps, initial encounter: Secondary | ICD-10-CM | POA: Diagnosis present

## 2023-03-06 DIAGNOSIS — Z7984 Long term (current) use of oral hypoglycemic drugs: Secondary | ICD-10-CM | POA: Diagnosis not present

## 2023-03-06 DIAGNOSIS — E559 Vitamin D deficiency, unspecified: Secondary | ICD-10-CM | POA: Diagnosis present

## 2023-03-06 DIAGNOSIS — R55 Syncope and collapse: Secondary | ICD-10-CM | POA: Diagnosis not present

## 2023-03-06 DIAGNOSIS — S72091A Other fracture of head and neck of right femur, initial encounter for closed fracture: Secondary | ICD-10-CM | POA: Diagnosis not present

## 2023-03-06 DIAGNOSIS — E875 Hyperkalemia: Secondary | ICD-10-CM | POA: Diagnosis not present

## 2023-03-06 DIAGNOSIS — K5900 Constipation, unspecified: Secondary | ICD-10-CM | POA: Diagnosis present

## 2023-03-06 DIAGNOSIS — W19XXXA Unspecified fall, initial encounter: Secondary | ICD-10-CM | POA: Diagnosis not present

## 2023-03-06 DIAGNOSIS — S79911A Unspecified injury of right hip, initial encounter: Secondary | ICD-10-CM | POA: Diagnosis not present

## 2023-03-06 DIAGNOSIS — M8588 Other specified disorders of bone density and structure, other site: Secondary | ICD-10-CM | POA: Diagnosis not present

## 2023-03-06 HISTORY — DX: Type 2 diabetes mellitus without complications: E11.9

## 2023-03-06 LAB — COMPREHENSIVE METABOLIC PANEL
ALT: 20 U/L (ref 0–44)
AST: 24 U/L (ref 15–41)
Albumin: 4.1 g/dL (ref 3.5–5.0)
Alkaline Phosphatase: 63 U/L (ref 38–126)
Anion gap: 11 (ref 5–15)
BUN: 15 mg/dL (ref 8–23)
CO2: 26 mmol/L (ref 22–32)
Calcium: 9.5 mg/dL (ref 8.9–10.3)
Chloride: 102 mmol/L (ref 98–111)
Creatinine, Ser: 0.96 mg/dL (ref 0.44–1.00)
GFR, Estimated: 60 mL/min — ABNORMAL LOW (ref >60)
Glucose, Bld: 130 mg/dL — ABNORMAL HIGH (ref 70–99)
Potassium: 3.4 mmol/L — ABNORMAL LOW (ref 3.5–5.1)
Sodium: 139 mmol/L (ref 135–145)
Total Bilirubin: 0.7 mg/dL (ref 0.3–1.2)
Total Protein: 7.2 g/dL (ref 6.5–8.1)

## 2023-03-06 LAB — CBC WITH DIFFERENTIAL/PLATELET
Abs Immature Granulocytes: 0.06 10*3/uL (ref 0.00–0.07)
Basophils Absolute: 0 10*3/uL (ref 0.0–0.1)
Basophils Relative: 1 %
Eosinophils Absolute: 0 10*3/uL (ref 0.0–0.5)
Eosinophils Relative: 1 %
HCT: 39 % (ref 36.0–46.0)
Hemoglobin: 13 g/dL (ref 12.0–15.0)
Immature Granulocytes: 1 %
Lymphocytes Relative: 44 %
Lymphs Abs: 2.5 10*3/uL (ref 0.7–4.0)
MCH: 25.8 pg — ABNORMAL LOW (ref 26.0–34.0)
MCHC: 33.3 g/dL (ref 30.0–36.0)
MCV: 77.4 fL — ABNORMAL LOW (ref 80.0–100.0)
Monocytes Absolute: 0.4 10*3/uL (ref 0.1–1.0)
Monocytes Relative: 7 %
Neutro Abs: 2.6 10*3/uL (ref 1.7–7.7)
Neutrophils Relative %: 46 %
Platelets: 141 10*3/uL — ABNORMAL LOW (ref 150–400)
RBC: 5.04 MIL/uL (ref 3.87–5.11)
RDW: 14.7 % (ref 11.5–15.5)
WBC: 5.6 10*3/uL (ref 4.0–10.5)
nRBC: 0 % (ref 0.0–0.2)

## 2023-03-06 LAB — ABO/RH: ABO/RH(D): B POS

## 2023-03-06 LAB — TYPE AND SCREEN
ABO/RH(D): B POS
Antibody Screen: NEGATIVE

## 2023-03-06 MED ORDER — POLYETHYLENE GLYCOL 3350 17 G PO PACK: 17.0000 g | PACK | Freq: Every day | ORAL | Status: DC

## 2023-03-06 MED ORDER — ONDANSETRON HCL 4 MG/2ML IJ SOLN
4.0000 mg | Freq: Once | INTRAMUSCULAR | Status: AC
  Administered 2023-03-06: 4 mg via INTRAVENOUS
  Filled 2023-03-06: qty 2

## 2023-03-06 MED ORDER — OXYCODONE HCL 5 MG PO TABS
5.0000 mg | ORAL_TABLET | ORAL | Status: DC | PRN
  Administered 2023-03-06: 5 mg via ORAL
  Filled 2023-03-06 (×2): qty 1

## 2023-03-06 MED ORDER — POTASSIUM CHLORIDE CRYS ER 20 MEQ PO TBCR
40.0000 meq | EXTENDED_RELEASE_TABLET | Freq: Once | ORAL | Status: AC
  Administered 2023-03-06: 40 meq via ORAL
  Filled 2023-03-06: qty 2

## 2023-03-06 MED ORDER — POVIDONE-IODINE 10 % EX SWAB
2.0000 | Freq: Once | CUTANEOUS | Status: AC
  Administered 2023-03-07: 2 via TOPICAL

## 2023-03-06 MED ORDER — MORPHINE SULFATE (PF) 4 MG/ML IV SOLN
4.0000 mg | Freq: Once | INTRAVENOUS | Status: AC
  Administered 2023-03-06: 4 mg via INTRAVENOUS
  Filled 2023-03-06: qty 1

## 2023-03-06 MED ORDER — LACTATED RINGERS IV BOLUS: 500.0000 mL | Freq: Once | INTRAVENOUS | Status: DC

## 2023-03-06 MED ORDER — LATANOPROST 0.005 % OP SOLN
1.0000 [drp] | Freq: Every day | OPHTHALMIC | Status: DC
  Administered 2023-03-06 – 2023-03-08 (×3): 1 [drp] via OPHTHALMIC
  Filled 2023-03-06 (×2): qty 2.5

## 2023-03-06 MED ORDER — ACETAMINOPHEN 500 MG PO TABS: 1000.0000 mg | ORAL_TABLET | Freq: Once | ORAL | Status: DC

## 2023-03-06 MED ORDER — TRANEXAMIC ACID-NACL 1000-0.7 MG/100ML-% IV SOLN
1000.0000 mg | INTRAVENOUS | Status: AC
  Administered 2023-03-07: 1000 mg via INTRAVENOUS

## 2023-03-06 MED ORDER — ENOXAPARIN SODIUM 40 MG/0.4ML IJ SOSY: 40.0000 mg | PREFILLED_SYRINGE | INTRAMUSCULAR | Status: DC

## 2023-03-06 MED ORDER — BUPIVACAINE LIPOSOME 1.3 % IJ SUSP: 10.0000 mL | Freq: Once | INTRAMUSCULAR | Status: DC

## 2023-03-06 MED ORDER — LACTATED RINGERS IV BOLUS
500.0000 mL | Freq: Once | INTRAVENOUS | Status: AC
  Administered 2023-03-06: 500 mL via INTRAVENOUS

## 2023-03-06 MED ORDER — ONDANSETRON HCL 4 MG PO TABS
8.0000 mg | ORAL_TABLET | Freq: Two times a day (BID) | ORAL | Status: DC | PRN
  Administered 2023-03-06: 8 mg via ORAL
  Filled 2023-03-06: qty 2

## 2023-03-06 MED ORDER — HYDROMORPHONE HCL 1 MG/ML IJ SOLN: 0.5000 mg | INTRAMUSCULAR | Status: DC | PRN

## 2023-03-06 MED ORDER — ROSUVASTATIN CALCIUM 20 MG PO TABS
40.0000 mg | ORAL_TABLET | Freq: Every day | ORAL | Status: DC
  Administered 2023-03-06 – 2023-03-09 (×4): 40 mg via ORAL
  Filled 2023-03-06 (×4): qty 2

## 2023-03-06 MED ORDER — ACETAMINOPHEN 500 MG PO TABS: 1000.0000 mg | ORAL_TABLET | Freq: Three times a day (TID) | ORAL | Status: DC

## 2023-03-06 MED ORDER — LACTATED RINGERS IV SOLN: INTRAVENOUS | Status: AC

## 2023-03-06 MED ORDER — ALLOPURINOL 100 MG PO TABS
100.0000 mg | ORAL_TABLET | Freq: Every day | ORAL | Status: DC
  Administered 2023-03-07 – 2023-03-09 (×3): 100 mg via ORAL
  Filled 2023-03-06 (×3): qty 1

## 2023-03-06 MED ORDER — CEFAZOLIN SODIUM-DEXTROSE 2-4 GM/100ML-% IV SOLN
2.0000 g | INTRAVENOUS | Status: AC
  Administered 2023-03-07: 2 g via INTRAVENOUS

## 2023-03-06 MED ORDER — ACETAMINOPHEN 650 MG RE SUPP: 650.0000 mg | Freq: Three times a day (TID) | RECTAL | Status: DC

## 2023-03-06 MED ORDER — ACETAMINOPHEN 500 MG PO TABS
1000.0000 mg | ORAL_TABLET | Freq: Three times a day (TID) | ORAL | Status: DC
  Administered 2023-03-06 – 2023-03-07 (×2): 1000 mg via ORAL
  Filled 2023-03-06 (×2): qty 2

## 2023-03-06 MED ORDER — DEXAMETHASONE SODIUM PHOSPHATE 10 MG/ML IJ SOLN: 8.0000 mg | Freq: Once | INTRAMUSCULAR | Status: DC

## 2023-03-06 MED ORDER — NALOXONE HCL 0.4 MG/ML IJ SOLN
0.4000 mg | INTRAMUSCULAR | Status: DC | PRN
  Administered 2023-03-06: 0.4 mg via INTRAVENOUS
  Filled 2023-03-06: qty 1

## 2023-03-06 MED ORDER — POVIDONE-IODINE 10 % EX SWAB: 2.0000 | Freq: Once | CUTANEOUS | Status: DC

## 2023-03-06 NOTE — Progress Notes (Signed)
Orthopedic Tech Progress Note Patient Details:  [name] 11-13-1941 [id number] Level 2 Trauma downgraded to a non-trauma.  Patient ID: [name], adult   DOB: March 15, 1941, 82 y.o.   MRN: [id number]  Jearld Lesch 03/06/2023, 11:24 AM

## 2023-03-06 NOTE — ED Notes (Signed)
ED TO INPATIENT HANDOFF REPORT  ED Nurse Name and Phone #: Massie Maroon RN [phone]  S Name/Age/Gender [name] 82 y.o. adult Room/Bed: 009C/009C  Code Status   Code Status: Full Code  Home/SNF/Other Home Patient oriented to: self, place, time, and situation Is this baseline? Yes   Triage Complete: Triage complete  Chief Complaint Closed displaced fracture of right femoral neck ([place]) [S72.001A]  Triage Note Pt BIB [facility] from home as Level 2 Fall on Thinners. She was wearing socks & fell forward 3 steps into her garage & landed on concrete, using her hands to block her face & denies hitting her head or LOC. Does take thinners but cannot recall what kind she takes, c/o Rt sided hip pain. EMS arrived & states she was A/Ox4, outward Rt sided hip rotation noted, sheet used as pelvic binder, c-collar in place, 152/78, 74 bpm, 18 resp, 98% on RA, CBG 132.   Allergies No Known Allergies  Level of Care/Admitting Diagnosis ED Disposition     ED Disposition  Admit   Condition  --   Comment  Hospital Area: [place] [[id number]]  Level of Care: Telemetry Medical [104]  May admit patient to [facility] or [facility] if equivalent level of care is available:: No  Covid Evaluation: Asymptomatic - no recent exposure (last 10 days) testing not required  Diagnosis: [id number]  Admitting Physician: Cresenciano Lick  Attending Physician: Sid Falcon [id number]  Certification:: I certify this patient will need inpatient services for at least 2 midnights  Estimated Length of Stay: 4          B Medical/Surgery History Past Medical History:  Diagnosis Date   Arthritis    Coronary artery disease    a. Inf STEMI (03/2014) => LHC (03/25/14):  Ostial/prox RI 70-80%, mid RI 95%, mid RCA occluded (culprit).  EF 60% with inferobasal HK.  RI small in distribution and med Rx recommended.  PCI:  Overlapping Xience Alpine  (2.5 x 12 mm and 3 x 8 mm) DES to mid RCA. // b. Myoview 6/18: EF 50, no ischemia; Low Risk    Hyperlipidemia    Hypertension    Microcytic anemia    ST elevation myocardial infarction (STEMI) of inferior wall ([place]) 03/25/2014   DES x2 RCA     Past Surgical History:  Procedure Laterality Date   BREAST BIOPSY Right 07/27/2011   CATARACT EXTRACTION     CORONARY ANGIOPLASTY WITH STENT PLACEMENT  03/25/2014   LAD irreg, RI 80%/95% (med Rx), RCA 100%-->0 w/ overlapping DES x 2, EF OK.   LEFT HEART CATH N/A 03/25/2014   Procedure: LEFT HEART CATH;  Surgeon: Sinclair Grooms, MD;  Location: [facility] CATH LAB;  Service: Cardiovascular;  Laterality: N/A;   PERCUTANEOUS CORONARY STENT INTERVENTION (PCI-S)  03/25/2014   Procedure: PERCUTANEOUS CORONARY STENT INTERVENTION (PCI-S);  Surgeon: Sinclair Grooms, MD;  Location: [facility] CATH LAB;  Service: Cardiovascular;;     A IV Location/Drains/Wounds Patient Lines/Drains/Airways Status     Active Line/Drains/Airways     Name Placement date Placement time Site Days   Peripheral IV 03/06/23 20 G Left Antecubital 03/06/23  1126  Antecubital  less than 1            Intake/Output Last 24 hours No intake or output data in the 24 hours ending 03/06/23 1326  Labs/Imaging Results for orders placed or performed during the hospital  encounter of 03/06/23 (from the past 48 hour(s))  CBC with Differential     Status: Abnormal   Collection Time: 03/06/23 11:22 AM  Result Value Ref Range   WBC 5.6 4.0 - 10.5 K/uL   RBC 5.04 3.87 - 5.11 MIL/uL   Hemoglobin 13.0 12.0 - 15.0 g/dL   HCT 39.0 36.0 - 46.0 %   MCV 77.4 (L) 80.0 - 100.0 fL   MCH 25.8 (L) 26.0 - 34.0 pg   MCHC 33.3 30.0 - 36.0 g/dL   RDW 14.7 11.5 - 15.5 %   Platelets 141 (L) 150 - 400 K/uL   nRBC 0.0 0.0 - 0.2 %   Neutrophils Relative % 46 %   Neutro Abs 2.6 1.7 - 7.7 K/uL   Lymphocytes Relative 44 %   Lymphs Abs 2.5 0.7 - 4.0 K/uL   Monocytes Relative 7 %   Monocytes Absolute 0.4 0.1 -  1.0 K/uL   Eosinophils Relative 1 %   Eosinophils Absolute 0.0 0.0 - 0.5 K/uL   Basophils Relative 1 %   Basophils Absolute 0.0 0.0 - 0.1 K/uL   Immature Granulocytes 1 %   Abs Immature Granulocytes 0.06 0.00 - 0.07 K/uL    Comment: Performed at [facility], [address], [place], [place] [address]  Comprehensive metabolic panel     Status: Abnormal   Collection Time: 03/06/23 11:22 AM  Result Value Ref Range   Sodium 139 135 - 145 mmol/L   Potassium 3.4 (L) 3.5 - 5.1 mmol/L   Chloride 102 98 - 111 mmol/L   CO2 26 22 - 32 mmol/L   Glucose, Bld 130 (H) 70 - 99 mg/dL    Comment: Glucose reference range applies only to samples taken after fasting for at least 8 hours.   BUN 15 8 - 23 mg/dL   Creatinine, Ser 0.96 0.44 - 1.00 mg/dL   Calcium 9.5 8.9 - 10.3 mg/dL   Total Protein 7.2 6.5 - 8.1 g/dL   Albumin 4.1 3.5 - 5.0 g/dL   AST 24 15 - 41 U/L   ALT 20 0 - 44 U/L   Alkaline Phosphatase 63 38 - 126 U/L   Total Bilirubin 0.7 0.3 - 1.2 mg/dL   GFR, Estimated 60 (L) >60 mL/min    Comment: (NOTE) Calculated using the CKD-EPI Creatinine Equation (2021)    Anion gap 11 5 - 15    Comment: Performed at [facility] [address], [place], [place] [address]  Type and screen     Status: None   Collection Time: 03/06/23 11:22 AM  Result Value Ref Range   ABO/RH(D) B POS    Antibody Screen NEG    Sample Expiration      03/09/2023,2359 Performed at [facility], [address], [place], [place] [address]    CT Head Wo Contrast  Result Date: 03/06/2023 CLINICAL DATA:  Fall at home today. Blunt head trauma. On anticoagulation. EXAM: CT HEAD WITHOUT CONTRAST TECHNIQUE: Contiguous axial images were obtained from the base of the skull through the vertex without intravenous contrast. RADIATION DOSE REDUCTION: This exam was performed according to the departmental dose-optimization program which includes automated exposure control, adjustment of the mA and/or kV according to  patient size and/or use of iterative reconstruction technique. COMPARISON:  05/25/2017 FINDINGS: Brain: No evidence of intracranial hemorrhage, acute infarction, hydrocephalus, extra-axial collection, or mass lesion/mass effect. Mild chronic small vessel disease shows no significant change. Vascular:  No hyperdense vessel  or other acute findings. Skull: No evidence of fracture or other significant bone abnormality. Sinuses/Orbits:  No acute findings. Other: None. IMPRESSION: No acute intracranial abnormality. Mild chronic small vessel disease. Electronically Signed   By: Marlaine Hind M.D.   On: 03/06/2023 12:18   DG Chest Portable 1 View  Result Date: 03/06/2023 CLINICAL DATA:  Fall EXAM: PORTABLE CHEST 1 VIEW COMPARISON:  09/23/2022 FINDINGS: The heart size and mediastinal contours are within normal limits. Both lungs are clear. The visualized skeletal structures are unremarkable. IMPRESSION: No active disease. Electronically Signed   By: Davina Poke D.O.   On: 03/06/2023 11:50   DG Hip [place] W or [place] Pelvis 1 View Right  Result Date: 03/06/2023 CLINICAL DATA:  Fall. EXAM: DG HIP (WITH OR WITHOUT PELVIS) 1V PORT RIGHT COMPARISON:  None Available. FINDINGS: Moderately displaced proximal right femoral neck fracture is noted. IMPRESSION: Moderately displaced proximal right femoral neck fracture. Electronically Signed   By: Marijo Conception M.D.   On: 03/06/2023 11:47    Pending Labs Unresulted Labs (From admission, onward)     Start     Ordered   03/07/23 0500  CBC  Tomorrow morning,   R        03/06/23 1319   03/07/23 [id number]  Basic metabolic panel  Tomorrow morning,   R        03/06/23 1320   03/06/23 1135  ABO/Rh  Once,   STAT        03/06/23 1135            Vitals/Pain Today's Vitals   03/06/23 1230 03/06/23 1245 03/06/23 1300 03/06/23 1315  BP: (!) 90/51 133/68 115/65 123/63  Pulse: (!) 48  76 62  Resp: 12 15 17 14   Temp:      TempSrc:      SpO2: 100%  99% 99%  Weight:       Height:      PainSc:    Asleep    Isolation Precautions No active isolations  Medications Medications  acetaminophen (TYLENOL) suppository 650 mg (has no administration in time range)  oxyCODONE (Oxy IR/ROXICODONE) immediate release tablet 5 mg (has no administration in time range)  HYDROmorphone (DILAUDID) injection 0.5-1 mg (has no administration in time range)  polyethylene glycol (MIRALAX / GLYCOLAX) packet 17 g (has no administration in time range)  morphine (PF) 4 MG/ML injection 4 mg (4 mg Intravenous Given 03/06/23 1137)  ondansetron (ZOFRAN) injection 4 mg (4 mg Intravenous Given 03/06/23 1136)    Mobility Walks at baseline ... Now cannot ambulate d/t injury from fall this morning.   R Recommendations: See Admitting Provider Note  Report given to: Her BP & O2 is sensitive to the pain meds she receives, will go down after a given dose.

## 2023-03-06 NOTE — ED Notes (Signed)
Daughter at bedside informed this RN that she did actual lose consciousness & she attempted CPR & then the pt came awake when EMS arrived on scene. EDP Floyd informed.

## 2023-03-06 NOTE — ED Notes (Signed)
Daughter at bedside.

## 2023-03-06 NOTE — ED Notes (Signed)
Pt sat was @ 55 informed RN Paige, She is placing pt on oxygen.

## 2023-03-06 NOTE — Anesthesia Preprocedure Evaluation (Signed)
Anesthesia Evaluation  Patient identified by MRN, date of birth, ID band Patient awake    Reviewed: Allergy & Precautions, NPO status , Patient's Chart, lab work & pertinent test results  Airway Mallampati: II  TM Distance: >3 FB Neck ROM: Full    Dental  (+) Dental Advisory Given, Edentulous Upper, Edentulous Lower   Pulmonary neg pulmonary ROS   Pulmonary exam normal breath sounds clear to auscultation       Cardiovascular hypertension, Pt. on medications and Pt. on home beta blockers + CAD, + Past MI and + Cardiac Stents  Normal cardiovascular exam+ dysrhythmias  Rhythm:Regular Rate:Normal  Stress myoview 2018  Nuclear stress EF: 50%.  There was no ST segment deviation noted during stress.  The study is normal. No ischemia.  This is a low risk study.  The left ventricular ejection fraction is mildly decreased (45-54%).    Neuro/Psych negative neurological ROS     GI/Hepatic negative GI ROS, Neg liver ROS,,,  Endo/Other  diabetes    Renal/GU negative Renal ROS     Musculoskeletal  (+) Arthritis ,    Abdominal   Peds  Hematology  (+) Blood dyscrasia, anemia   Anesthesia Other Findings   Reproductive/Obstetrics                              Anesthesia Physical Anesthesia Plan  ASA: 3  Anesthesia Plan: General   Post-op Pain Management: Tylenol PO (pre-op)*   Induction: Intravenous  PONV Risk Score and Plan: 4 or greater and Ondansetron, Dexamethasone and Treatment may vary due to age or medical condition  Airway Management Planned: Oral ETT  Additional Equipment: None  Intra-op Plan:   Post-operative Plan: Extubation in OR  Informed Consent: I have reviewed the patients History and Physical, chart, labs and discussed the procedure including the risks, benefits and alternatives for the proposed anesthesia with the patient or authorized representative who has indicated  his/her understanding and acceptance.     Dental advisory given  Plan Discussed with: CRNA  Anesthesia Plan Comments:         Anesthesia Quick Evaluation

## 2023-03-06 NOTE — ED Provider Notes (Signed)
[facility] Provider Note   CSN: [id number] Arrival date & time: 03/06/23  1112     History  Chief Complaint  Patient presents with   Level 2 _FOT    [name] is a 82 y.o. adult.  82 yo F with a chief complaint of a fall.  The patient was walking down the stairs and she slipped and fell.  Says that she landed mostly on her right side.  Complaining of right-sided hip pain.  Unable to walk afterwards.  She was unsure if she took blood thinners and so arrived as a level 2 trauma.  She does deny any pain to her head or neck.  Denies head injury denies back pain denies chest pain denies abdominal pain.        Home Medications Prior to Admission medications   Medication Sig Start Date End Date Taking? Authorizing Provider  allopurinol (ZYLOPRIM) 100 MG tablet  11/29/17   [provider]  Apoaequorin (PREVAGEN PO) Take by mouth daily.    [provider]  ASPIRIN LOW DOSE 81 MG EC tablet TAKE 1 TABLET BY MOUTH EVERY DAY 02/18/15   Belva Crome, MD  Baclofen 5 MG TABS Take 5 mg by mouth at bedtime as needed. 09/23/22   Raspet, Derry Skill, PA-C  Blood Glucose Monitoring Suppl (TRUE METRIX AIR GLUCOSE METER) w/Device KIT  12/13/17   [provider]  COLCRYS 0.6 MG tablet Take 0.6 mg by mouth daily. 06/22/16   [provider]  diclofenac Sodium (VOLTAREN) 1 % GEL Apply topically as needed. 01/14/22   [provider]  hydrochlorothiazide (HYDRODIURIL) 25 MG tablet Take 1 tablet (25 mg total) by mouth daily. 12/03/15   Burtis Junes, NP  hydrOXYzine (ATARAX/VISTARIL) 25 MG tablet Take 25 mg by mouth as needed for itching.     [provider]  latanoprost (XALATAN) 0.005 % ophthalmic solution Place 1 drop into both eyes at bedtime.    [provider]  lisinopril (ZESTRIL) 20 MG tablet Take 1 tablet (20 mg total) by mouth daily. 10/20/19   Belva Crome, MD  metFORMIN (GLUCOPHAGE) 500  MG tablet Take 500 mg by mouth daily. 06/24/16   [provider]  metoprolol succinate (TOPROL-XL) 50 MG 24 hr tablet TAKE 1 TABLET (50 MG TOTAL) BY MOUTH DAILY. 08/11/16   Belva Crome, MD  nitroGLYCERIN (NITROSTAT) 0.4 MG SL tablet Place 1 tablet (0.4 mg total) under the tongue every 5 (five) minutes as needed for chest pain. 05/30/18   Belva Crome, MD  pantoprazole (PROTONIX) 40 MG tablet Take 40 mg by mouth daily as needed. Acid reflux 10/20/19   Belva Crome, MD  rosuvastatin (CRESTOR) 40 MG tablet Take 1 tablet (40 mg total) by mouth daily. 12/23/22   Tobb, Godfrey Pick, DO  TRUE METRIX BLOOD GLUCOSE TEST test strip  12/08/17   [provider]  TRUEPLUS LANCETS 33G MISC  12/13/17   [provider]  VOLTAREN 1 % GEL Apply topically as directed.  02/01/15   [provider]      Allergies    Patient has no known allergies.    Review of Systems   Review of Systems  Physical Exam Updated Vital Signs BP 124/63   Pulse 68   Temp 97.8 F (36.6 C) (Oral)   Resp 18   Ht 5\' 4"  (1.626 m)   Wt 70.8 kg   SpO2 97%   BMI  26.78 kg/m  Physical Exam Vitals and nursing note reviewed.  Constitutional:      General: He is not in acute distress.    Appearance: He is well-developed. He is not diaphoretic.  HENT:     Head: Normocephalic and atraumatic.  Eyes:     Pupils: Pupils are equal, round, and reactive to light.  Cardiovascular:     Rate and Rhythm: Normal rate and regular rhythm.     Heart sounds: No murmur heard.    No friction rub. No gallop.  Pulmonary:     Effort: Pulmonary effort is normal.     Breath sounds: No wheezing or rales.  Abdominal:     General: There is no distension.     Palpations: Abdomen is soft.     Tenderness: There is no abdominal tenderness.  Musculoskeletal:        General: Tenderness present.     Cervical back: Normal range of motion and neck supple.     Comments: Shortening and external rotation of the right lower  extremity.  Pulse motor and sensation intact.  No obvious pain at the knee or of the tib-fib.  Pelvis is stable.  Palpated from head to toe without any other noted areas of bony tenderness.  Skin:    General: Skin is warm and dry.  Neurological:     Mental Status: He is alert and oriented to person, place, and time.  Psychiatric:        Behavior: Behavior normal.     ED Results / Procedures / Treatments   Labs (all labs ordered are listed, but only abnormal results are displayed) Labs Reviewed  CBC WITH DIFFERENTIAL/PLATELET - Abnormal; Notable for the following components:      Result Value   MCV 77.4 (*)    MCH 25.8 (*)    Platelets 141 (*)    All other components within normal limits  COMPREHENSIVE METABOLIC PANEL - Abnormal; Notable for the following components:   Potassium 3.4 (*)    Glucose, Bld 130 (*)    GFR, Estimated 60 (*)    All other components within normal limits  TYPE AND SCREEN  ABO/RH    EKG None  Radiology CT Head Wo Contrast  Result Date: 03/06/2023 CLINICAL DATA:  Fall at home today. Blunt head trauma. On anticoagulation. EXAM: CT HEAD WITHOUT CONTRAST TECHNIQUE: Contiguous axial images were obtained from the base of the skull through the vertex without intravenous contrast. RADIATION DOSE REDUCTION: This exam was performed according to the departmental dose-optimization program which includes automated exposure control, adjustment of the mA and/or kV according to patient size and/or use of iterative reconstruction technique. COMPARISON:  05/25/2017 FINDINGS: Brain: No evidence of intracranial hemorrhage, acute infarction, hydrocephalus, extra-axial collection, or mass lesion/mass effect. Mild chronic small vessel disease shows no significant change. Vascular:  No hyperdense vessel or other acute findings. Skull: No evidence of fracture or other significant bone abnormality. Sinuses/Orbits:  No acute findings. Other: None. IMPRESSION: No acute intracranial  abnormality. Mild chronic small vessel disease. Electronically Signed   By: Marlaine Hind M.D.   On: 03/06/2023 12:18   DG Chest Portable 1 View  Result Date: 03/06/2023 CLINICAL DATA:  Fall EXAM: PORTABLE CHEST 1 VIEW COMPARISON:  09/23/2022 FINDINGS: The heart size and mediastinal contours are within normal limits. Both lungs are clear. The visualized skeletal structures are unremarkable. IMPRESSION: No active disease. Electronically Signed   By: Davina Poke D.O.   On: 03/06/2023 11:50   DG  Hip [place] or [place] Pelvis 1 View Right  Result Date: 03/06/2023 CLINICAL DATA:  Fall. EXAM: DG HIP (WITH OR WITHOUT PELVIS) 1V PORT RIGHT COMPARISON:  None Available. FINDINGS: Moderately displaced proximal right femoral neck fracture is noted. IMPRESSION: Moderately displaced proximal right femoral neck fracture. Electronically Signed   By: Marijo Conception M.D.   On: 03/06/2023 11:47    Procedures Procedures    Medications Ordered in ED Medications  morphine (PF) 4 MG/ML injection 4 mg (4 mg Intravenous Given 03/06/23 1137)  ondansetron (ZOFRAN) injection 4 mg (4 mg Intravenous Given 03/06/23 1136)    ED Course/ Medical Decision Making/ A&P                             Medical Decision Making Amount and/or Complexity of Data Reviewed Labs: ordered. Radiology: ordered. ECG/medicine tests: ordered.  Risk Prescription drug management.   82 yo F with a chief complaints of a slip and fall.  Patient went down about 3 steps.  She landed on her right side.  Complaining mostly of right-sided hip pain.  She arrived as a level 2 trauma concern for fall on thinners with distracting injury.  Patient has no signs of head trauma has no head or neck pain.  She also does not take blood thinners on my record review, takes a baby aspirin.  I downgraded this.  Concern for right-sided hip fracture.  Will obtain a plain film.  Blood work chest x-ray per hospital protocol.  Family has arrived and provides  further history, states that the patient was unresponsive for a period of time, they debated doing CPR and the patient was arousable.  Seems unlikely to be due to an intracranial issue though will obtain a CT scan of the head.  Plain film of the hip independently interpreted by me with a femoral neck fracture.  Plain film of the chest independently interpreted by me without focal infiltrate or pneumothorax.  No anemia, no significant electrolyte abnormality.  Case was discussed with [facility], orthopedics plans to take the patient to the OR tomorrow.  CT of the head negative for intracranial hemorrhage is independently reviewed by me.  Discussed with medicine for admission.  The patients results and plan were reviewed and discussed.   Any x-rays performed were independently reviewed by myself.   Differential diagnosis were considered with the presenting HPI.  Medications  morphine (PF) 4 MG/ML injection 4 mg (4 mg Intravenous Given 03/06/23 1137)  ondansetron (ZOFRAN) injection 4 mg (4 mg Intravenous Given 03/06/23 1136)    Vitals:   03/06/23 1130 03/06/23 1141 03/06/23 1145 03/06/23 1146  BP: (!) 92/56  124/63   Pulse: 69  68   Resp: (!) 25  18   Temp:    97.8 F (36.6 C)  TempSrc:    Oral  SpO2: 100%  97%   Weight:  70.8 kg    Height:  5\' 4"  (1.626 m)      Final diagnoses:  Closed displaced fracture of right femoral neck (HCC)    Admission/ observation were discussed with the admitting physician, patient and/or family and they are comfortable with the plan.          Final Clinical Impression(s) / ED Diagnoses Final diagnoses:  Closed displaced fracture of right femoral neck ([facility])    Rx / DC Orders ED Discharge Orders     None  Deno Etienne, DO 03/06/23 1228

## 2023-03-06 NOTE — H&P (View-Only) (Signed)
ORTHOPAEDIC CONSULTATION  REQUESTING PHYSICIAN: Deno Etienne, DO  Chief Complaint: right hip pain  HPI: [name] is a 82 y.o. adult who complains of pain in the right hip after falling at home. She had immediate pain and inability to get up. Her family reports she was actually unresponsive for a period of time prior to EMS arrival.  Imaging shows a moderately displaced proximal right femoral neck fracture.    Orthopedics was consulted for evaluation.    + history of MI. - h/o CVA, DVT, PE.  Previously ambulatory.  The patient is living with her daughter and son in law.    Past Medical History:  Diagnosis Date   Arthritis    Coronary artery disease    a. Inf STEMI (03/2014) => LHC (03/25/14):  Ostial/prox RI 70-80%, mid RI 95%, mid RCA occluded ([place]rit).  EF 60% with inferobasal HK.  RI small in distribution and med Rx recommended.  PCI:  Overlapping Xience [place] (2.5 x 12 mm and 3 x 8 mm) DES to mid RCA. // b. Myoview 6/18: EF 50, no ischemia; Low Risk    Hyperlipidemia    Hypertension    Microcytic anemia    ST elevation myocardial infarction (STEMI) of inferior wall ([place]) 03/25/2014   DES x2 RCA     Past Surgical History:  Procedure Laterality Date   BREAST BIOPSY Right 07/27/2011   CATARACT EXTRACTION     CORONARY ANGIOPLASTY WITH STENT PLACEMENT  03/25/2014   LAD irreg, RI 80%/95% (med Rx), RCA 100%-->0 w/ overlapping DES x 2, EF OK.   LEFT HEART CATH N/A 03/25/2014   Procedure: LEFT HEART CATH;  Surgeon: Sinclair Grooms, MD;  Location: [facility] CATH LAB;  Service: Cardiovascular;  Laterality: N/A;   PERCUTANEOUS CORONARY STENT INTERVENTION (PCI-S)  03/25/2014   Procedure: PERCUTANEOUS CORONARY STENT INTERVENTION (PCI-S);  Surgeon: Sinclair Grooms, MD;  Location: [facility] CATH LAB;  Service: Cardiovascular;;   Social History   Socioeconomic History   Marital status: Single    Spouse name: Not on file   Number of children: Not on file   Years of education: Not on  file   Highest education level: Not on file  Occupational History   Not on file  Tobacco Use   Smoking status: Never   Smokeless tobacco: Never  Vaping Use   Vaping Use: Never used  Substance and Sexual Activity   Alcohol use: No    Alcohol/week: 0.0 standard drinks of alcohol   Drug use: No   Sexual activity: Not Currently  Other Topics Concern   Not on file  Social History Narrative   Not on file   Social Determinants of Health   Financial Resource Strain: Not on file  Food Insecurity: Not on file  Transportation Needs: Not on file  Physical Activity: Not on file  Stress: Not on file  Social Connections: Not on file   Family History  Problem Relation Age of Onset   Stroke Sister    No Known Allergies Prior to Admission medications   Medication Sig Start Date End Date Taking? Authorizing Provider  allopurinol (ZYLOPRIM) 100 MG tablet  11/29/17   [provider]  Apoaequorin (PREVAGEN PO) Take by mouth daily.    [provider]  ASPIRIN LOW DOSE 81 MG EC tablet TAKE 1 TABLET BY MOUTH EVERY DAY 02/18/15   Belva Crome, MD  Baclofen 5 MG TABS Take 5 mg by mouth at bedtime as  needed. 09/23/22   Raspet, Derry Skill, PA-C  Blood Glucose Monitoring Suppl (TRUE METRIX AIR GLUCOSE METER) w/Device KIT  12/13/17   [provider]  COLCRYS 0.6 MG tablet Take 0.6 mg by mouth daily. 06/22/16   [provider]  diclofenac Sodium (VOLTAREN) 1 % GEL Apply topically as needed. 01/14/22   [provider]  hydrochlorothiazide (HYDRODIURIL) 25 MG tablet Take 1 tablet (25 mg total) by mouth daily. 12/03/15   Burtis Junes, NP  hydrOXYzine (ATARAX/VISTARIL) 25 MG tablet Take 25 mg by mouth as needed for itching.     [provider]  latanoprost (XALATAN) 0.005 % ophthalmic solution Place 1 drop into both eyes at bedtime.    [provider]  lisinopril (ZESTRIL) 20 MG tablet Take 1 tablet (20 mg total) by mouth daily. 10/20/19   Belva Crome, MD  metFORMIN (GLUCOPHAGE) 500 MG tablet Take 500 mg by mouth daily. 06/24/16   [provider]  metoprolol succinate (TOPROL-XL) 50 MG 24 hr tablet TAKE 1 TABLET (50 MG TOTAL) BY MOUTH DAILY. 08/11/16   Belva Crome, MD  nitroGLYCERIN (NITROSTAT) 0.4 MG SL tablet Place 1 tablet (0.4 mg total) under the tongue every 5 (five) minutes as needed for chest pain. 05/30/18   Belva Crome, MD  pantoprazole (PROTONIX) 40 MG tablet Take 40 mg by mouth daily as needed. Acid reflux 10/20/19   Belva Crome, MD  rosuvastatin (CRESTOR) 40 MG tablet Take 1 tablet (40 mg total) by mouth daily. 12/23/22   Tobb, Godfrey Pick, DO  TRUE METRIX BLOOD GLUCOSE TEST test strip  12/08/17   [provider]  TRUEPLUS LANCETS 33G MISC  12/13/17   [provider]  VOLTAREN 1 % GEL Apply topically as directed.  02/01/15   [provider]   CT Head Wo Contrast  Result Date: 03/06/2023 CLINICAL DATA:  Fall at home today. Blunt head trauma. On anticoagulation. EXAM: CT HEAD WITHOUT CONTRAST TECHNIQUE: Contiguous axial images were obtained from the base of the skull through the vertex without intravenous contrast. RADIATION DOSE REDUCTION: This exam was performed according to the departmental dose-optimization program which includes automated exposure control, adjustment of the mA and/or kV according to patient size and/or use of iterative reconstruction technique. COMPARISON:  05/25/2017 FINDINGS: Brain: No evidence of intracranial hemorrhage, acute infarction, hydrocephalus, extra-axial collection, or mass lesion/mass effect. Mild chronic small vessel disease shows no significant change. Vascular:  No hyperdense vessel or other acute findings. Skull: No evidence of fracture or other significant bone abnormality. Sinuses/Orbits:  No acute findings. Other: None. IMPRESSION: No acute intracranial abnormality. Mild chronic small vessel disease. Electronically Signed   By: Marlaine Hind M.D.   On:  03/06/2023 12:18   DG Chest Portable 1 View  Result Date: 03/06/2023 CLINICAL DATA:  Fall EXAM: PORTABLE CHEST 1 VIEW COMPARISON:  09/23/2022 FINDINGS: The heart size and mediastinal contours are within normal limits. Both lungs are clear. The visualized skeletal structures are unremarkable. IMPRESSION: No active disease. Electronically Signed   By: Davina Poke D.O.   On: 03/06/2023 11:50   DG Hip [place] or [place] Pelvis 1 View Right  Result Date: 03/06/2023 CLINICAL DATA:  Fall. EXAM: DG HIP (WITH OR WITHOUT PELVIS) 1V PORT RIGHT COMPARISON:  None Available. FINDINGS: Moderately displaced proximal right femoral neck fracture is noted. IMPRESSION: Moderately displaced proximal right femoral neck fracture. Electronically Signed   By: Marijo Conception M.D.   On: 03/06/2023 11:47  Positive ROS: All other systems have been reviewed and were otherwise negative with the exception of those mentioned in the HPI and as above.  Objective: Labs cbc Recent Labs    03/06/23 1122  WBC 5.6  HGB 13.0  HCT 39.0  PLT 141*    Labs inflam No results for input(s): "CRP" in the last 72 hours.  Invalid input(s): "ESR"  Labs coag No results for input(s): "INR", "PTT" in the last 72 hours.  Invalid input(s): "PT"  Recent Labs    03/06/23 1122  NA 139  K 3.4*  CL 102  CO2 26  GLUCOSE 130*  BUN 15  CREATININE 0.96  CALCIUM 9.5    Physical Exam: Vitals:   03/06/23 1146 03/06/23 1230  BP:  (!) 90/51  Pulse:  (!) 48  Resp:  12  Temp: 97.8 F (36.6 C)   SpO2:  100%   General: Alert, no acute distress.  Sitting up in bed, calm, comfortable Mental status: Alert and Oriented x3 Neurologic: Speech Clear and organized, no gross focal findings or movement disorder appreciated. Respiratory: No cyanosis, no use of accessory musculature Cardiovascular: No pedal edema GI: Abdomen is soft and non-tender, non-distended. Skin: Warm and dry.  No lesions in the area of chief  complaint. Extremities: Warm and well perfused w/o edema Psychiatric: Patient is competent for consent with normal mood and affect  MUSCULOSKELETAL:  TTP right thigh, limited ROM d/t pain, able to move ankle though, leg is externally rotated, NVI  Other extremities are atraumatic with painless ROM and NVI.  Assessment / Plan: Active Problems:   * No active hospital problems. *    Will plan to take the patient to the OR tomorrow morning for a right THA due to the location and severity of the fracture which has compromised the blood supply to the femoral head. Make NPO at midnight.   Weightbearing: NWB RLE Showering: fall risk VTE prophylaxis:  ASA on hold   Pain control: limit narcotics as able due to age Contact information:  Edmonia Lynch MD, [facility] PA-C  Britt Bottom PA-C Office [phone] 03/06/2023 12:46 PM

## 2023-03-06 NOTE — ED Notes (Signed)
X-ray at bedside

## 2023-03-06 NOTE — Hospital Course (Addendum)
[name] is a 82 y.o. 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  Patient admitted for further evaluation and management of acute right proximal femoral neck fracture and workup of syncope.   #Acute right proximal femoral neck fracture #Osteopenia Patient was coming downstairs today, slipped and fell, and had immediate pain.  Patient fell about 3 steps.  Patient also had positive loss of consciousness.  No head impact.  Patient presented emergency room.  Imaging revealed proximal right femoral neck fracture.  CT head was negative for any intracranial bleed.  Orthopedics consulted, recommending surgery tomorrow.  Reviewed charting, patient had bone density scan 4 years ago consistent with osteopenia.  Could be contributing as well.  Will manage patient's pain during hospitalization and coordinate PT for her. -Multimodal pain control with Tylenol, Dilaudid, and oxycodone -Vitamin D pending -PT/OT to evaluate after surgery -Orthopedics following, appreciate recs   #Syncope  Over the past few years, patient has had multiple syncopal episodes.  Per family, patient's most recent syncope episode was about 2 months ago, where she was sitting in the car, and passed out after becoming hot.  Per family patient has these syncopal episodes during bowel movements as well and when she is constipated.  On exam, patient did not seem hypovolemic, but patient did have softer blood pressures.  Also while in the room, patient had heart rates that trended from the 40s to the 70s.  Patient has a description of today's episode as well as other episodes, but do not think these are seizures.  I do wonder if the syncopal episodes could be cardiac in nature related to valvular disease or decreased output. Her heart rates seem to jump up and down during my talk with her, I wonder if she has  a etiology of sick sinus syndrome.  Another etiology could potentially be vasovagal in the setting of pain or straining.  Likely today's syncopal episode due to pain.  Would like to continue to workup. -Echo pending -Monitor on telemetry -After surgery will obtain orthostatics -Hold home hypertensive medications -EKG pending   #Hypokalemia Potassium 3.4 on admission.  Will replete and recheck in the morning.  Will also check magnesium in the morning. -Mag in a.m. -BMP in AM   #Hypertension Patient slightly hypotensive during hospitalization initially.  Most recent blood pressure 127/63.  Did have a low of systolics into the 0000000.  Patient states she did not take her antitetanus hypertensive medications today.  Home antihypertensive medications include metoprolol succinate 50 mg, lisinopril 20 mg. -Hold home BP meds  -Monitor blood pressure closely  -Fluids with LR 500 cc bolus   #History of CAD #Status post drug-eluting stent x 2 in 2015 #Hyperlipidemia Patient with past medical history of CAD with STEMI in 2015 status post drug-eluting stent x 2.  Patient currently on aspirin and rosuvastatin 40 mg at home.  Unclear if patient is actually taking her rosuvastatin.  No acute concerns for MI at this time. -Hold aspirin -Can resume rosuvastatin 40 mg daily   #Type 2 diabetes mellitus Patient has a history of type 2 diabetes mellitus.  Last A1c greater than 3 years ago at 6.3.  Will obtain A1c. Monitor CBGs. -A1c pending  -Monitor CBGs    03/08/23 Overnight she was shaking. They gave her ice on her chest and back. Tachy lasted 10 mins. Not hot or  sweaty, SOB, chest pain during episode. Did not feel abnormal. Did not pass out.  Has been constipated since admission. Feels like if she could use the bathroom she could be fine  Never had SVT or arrhythmia before. No chest pain right now.   Cannot bend her knee without a pillow underneath

## 2023-03-06 NOTE — Progress Notes (Signed)
Received patient at 1456 upon assessment of pt vital signs were 62/37 and HR 48. Made MD aware. MD arrived at bedside and ordered bolus fluids and 1 dose of narcan.

## 2023-03-06 NOTE — Progress Notes (Addendum)
Messaged by nurse at 1500 as blood pressure was 62/37 and HR of 48.  Went up to evaluate patient.  Subjective: Patient was resting comfortably in bed upon my exam.  She was sleeping, and I had to wake her up.  Patient denied any shortness of breath, chest pain, lightheadedness, or dizziness.  She states she is doing fine.  She was smiling.  Objective: Cycled blood pressure in the room, and blood pressure was 92/66.  Heart regular rate and rhythm, no murmurs, rubs, or gallops.  Lungs clear to auscultation bilaterally.  Assessment/Plan: Per chart review, patient had morphine 4 mg given to her at about 11:30 AM.  Likely blood pressure decreased and heart rate decreased secondary to administration of 4 mg morphine.  Likely was too high of a dose for patient, as patient is opioid nave.  Plan will be to bolus patient 1 L lactated Ringer's along with infusion for 10 hours afterwards.  Will also administer 1 dose of Narcan.  Will continue to monitor heart rate and blood pressure.   Addendum: After patient was given narcan and fluids, evaluated and BP improved well. Patient was doing well. She had no concerns. Continue fluids.

## 2023-03-06 NOTE — Consult Note (Signed)
ORTHOPAEDIC CONSULTATION  REQUESTING PHYSICIAN: Deno Etienne, DO  Chief Complaint: right hip pain  HPI: [name] is a 82 y.o. adult who complains of pain in the right hip after falling at home. She had immediate pain and inability to get up. Her family reports she was actually unresponsive for a period of time prior to EMS arrival.  Imaging shows a moderately displaced proximal right femoral neck fracture.    Orthopedics was consulted for evaluation.    + history of MI. - h/o CVA, DVT, PE.  Previously ambulatory.  The patient is living with her daughter and son in law.    Past Medical History:  Diagnosis Date   Arthritis    Coronary artery disease    a. Inf STEMI (03/2014) => LHC (03/25/14):  Ostial/prox RI 70-80%, mid RI 95%, mid RCA occluded (culprit).  EF 60% with inferobasal HK.  RI small in distribution and med Rx recommended.  PCI:  Overlapping Xience Alpine (2.5 x 12 mm and 3 x 8 mm) DES to mid RCA. // b. Myoview 6/18: EF 50, no ischemia; Low Risk    Hyperlipidemia    Hypertension    Microcytic anemia    ST elevation myocardial infarction (STEMI) of inferior wall ([place]) 03/25/2014   DES x2 RCA     Past Surgical History:  Procedure Laterality Date   BREAST BIOPSY Right 07/27/2011   CATARACT EXTRACTION     CORONARY ANGIOPLASTY WITH STENT PLACEMENT  03/25/2014   LAD irreg, RI 80%/95% (med Rx), RCA 100%-->0 w/ overlapping DES x 2, EF OK.   LEFT HEART CATH N/A 03/25/2014   Procedure: LEFT HEART CATH;  Surgeon: Sinclair Grooms, MD;  Location: [facility] CATH LAB;  Service: Cardiovascular;  Laterality: N/A;   PERCUTANEOUS CORONARY STENT INTERVENTION (PCI-S)  03/25/2014   Procedure: PERCUTANEOUS CORONARY STENT INTERVENTION (PCI-S);  Surgeon: Sinclair Grooms, MD;  Location: [facility] CATH LAB;  Service: Cardiovascular;;   Social History   Socioeconomic History   Marital status: Single    Spouse name: Not on file   Number of children: Not on file   Years of education: Not on  file   Highest education level: Not on file  Occupational History   Not on file  Tobacco Use   Smoking status: Never   Smokeless tobacco: Never  Vaping Use   Vaping Use: Never used  Substance and Sexual Activity   Alcohol use: No    Alcohol/week: 0.0 standard drinks of alcohol   Drug use: No   Sexual activity: Not Currently  Other Topics Concern   Not on file  Social History Narrative   Not on file   Social Determinants of Health   Financial Resource Strain: Not on file  Food Insecurity: Not on file  Transportation Needs: Not on file  Physical Activity: Not on file  Stress: Not on file  Social Connections: Not on file   Family History  Problem Relation Age of Onset   Stroke Sister    No Known Allergies Prior to Admission medications   Medication Sig Start Date End Date Taking? Authorizing Provider  allopurinol (ZYLOPRIM) 100 MG tablet  11/29/17   [provider]  Apoaequorin (PREVAGEN PO) Take by mouth daily.    [provider]  ASPIRIN LOW DOSE 81 MG EC tablet TAKE 1 TABLET BY MOUTH EVERY DAY 02/18/15   Belva Crome, MD  Baclofen 5 MG TABS Take 5 mg by mouth at bedtime as  needed. 09/23/22   Raspet, Derry Skill, PA-C  Blood Glucose Monitoring Suppl (TRUE METRIX AIR GLUCOSE METER) w/Device KIT  12/13/17   [provider]  COLCRYS 0.6 MG tablet Take 0.6 mg by mouth daily. 06/22/16   [provider]  diclofenac Sodium (VOLTAREN) 1 % GEL Apply topically as needed. 01/14/22   [provider]  hydrochlorothiazide (HYDRODIURIL) 25 MG tablet Take 1 tablet (25 mg total) by mouth daily. 12/03/15   Burtis Junes, NP  hydrOXYzine (ATARAX/VISTARIL) 25 MG tablet Take 25 mg by mouth as needed for itching.     [provider]  latanoprost (XALATAN) 0.005 % ophthalmic solution Place 1 drop into both eyes at bedtime.    [provider]  lisinopril (ZESTRIL) 20 MG tablet Take 1 tablet (20 mg total) by mouth daily. 10/20/19   Belva Crome, MD  metFORMIN (GLUCOPHAGE) 500 MG tablet Take 500 mg by mouth daily. 06/24/16   [provider]  metoprolol succinate (TOPROL-XL) 50 MG 24 hr tablet TAKE 1 TABLET (50 MG TOTAL) BY MOUTH DAILY. 08/11/16   Belva Crome, MD  nitroGLYCERIN (NITROSTAT) 0.4 MG SL tablet Place 1 tablet (0.4 mg total) under the tongue every 5 (five) minutes as needed for chest pain. 05/30/18   Belva Crome, MD  pantoprazole (PROTONIX) 40 MG tablet Take 40 mg by mouth daily as needed. Acid reflux 10/20/19   Belva Crome, MD  rosuvastatin (CRESTOR) 40 MG tablet Take 1 tablet (40 mg total) by mouth daily. 12/23/22   Tobb, Godfrey Pick, DO  TRUE METRIX BLOOD GLUCOSE TEST test strip  12/08/17   [provider]  TRUEPLUS LANCETS 33G MISC  12/13/17   [provider]  VOLTAREN 1 % GEL Apply topically as directed.  02/01/15   [provider]   CT Head Wo Contrast  Result Date: 03/06/2023 CLINICAL DATA:  Fall at home today. Blunt head trauma. On anticoagulation. EXAM: CT HEAD WITHOUT CONTRAST TECHNIQUE: Contiguous axial images were obtained from the base of the skull through the vertex without intravenous contrast. RADIATION DOSE REDUCTION: This exam was performed according to the departmental dose-optimization program which includes automated exposure control, adjustment of the mA and/or kV according to patient size and/or use of iterative reconstruction technique. COMPARISON:  05/25/2017 FINDINGS: Brain: No evidence of intracranial hemorrhage, acute infarction, hydrocephalus, extra-axial collection, or mass lesion/mass effect. Mild chronic small vessel disease shows no significant change. Vascular:  No hyperdense vessel or other acute findings. Skull: No evidence of fracture or other significant bone abnormality. Sinuses/Orbits:  No acute findings. Other: None. IMPRESSION: No acute intracranial abnormality. Mild chronic small vessel disease. Electronically Signed   By: Marlaine Hind M.D.   On:  03/06/2023 12:18   DG Chest Portable 1 View  Result Date: 03/06/2023 CLINICAL DATA:  Fall EXAM: PORTABLE CHEST 1 VIEW COMPARISON:  09/23/2022 FINDINGS: The heart size and mediastinal contours are within normal limits. Both lungs are clear. The visualized skeletal structures are unremarkable. IMPRESSION: No active disease. Electronically Signed   By: Davina Poke D.O.   On: 03/06/2023 11:50   DG Hip [place] W or [place] Pelvis 1 View Right  Result Date: 03/06/2023 CLINICAL DATA:  Fall. EXAM: DG HIP (WITH OR WITHOUT PELVIS) 1V PORT RIGHT COMPARISON:  None Available. FINDINGS: Moderately displaced proximal right femoral neck fracture is noted. IMPRESSION: Moderately displaced proximal right femoral neck fracture. Electronically Signed   By: Marijo Conception M.D.   On: 03/06/2023 11:47  Positive ROS: All other systems have been reviewed and were otherwise negative with the exception of those mentioned in the HPI and as above.  Objective: Labs cbc Recent Labs    03/06/23 1122  WBC 5.6  HGB 13.0  HCT 39.0  PLT 141*    Labs inflam No results for input(s): "CRP" in the last 72 hours.  Invalid input(s): "ESR"  Labs coag No results for input(s): "INR", "PTT" in the last 72 hours.  Invalid input(s): "PT"  Recent Labs    03/06/23 1122  NA 139  K 3.4*  CL 102  CO2 26  GLUCOSE 130*  BUN 15  CREATININE 0.96  CALCIUM 9.5    Physical Exam: Vitals:   03/06/23 1146 03/06/23 1230  BP:  (!) 90/51  Pulse:  (!) 48  Resp:  12  Temp: 97.8 F (36.6 C)   SpO2:  100%   General: Alert, no acute distress.  Sitting up in bed, calm, comfortable Mental status: Alert and Oriented x3 Neurologic: Speech Clear and organized, no gross focal findings or movement disorder appreciated. Respiratory: No cyanosis, no use of accessory musculature Cardiovascular: No pedal edema GI: Abdomen is soft and non-tender, non-distended. Skin: Warm and dry.  No lesions in the area of chief  complaint. Extremities: Warm and well perfused w/o edema Psychiatric: Patient is competent for consent with normal mood and affect  MUSCULOSKELETAL:  TTP right thigh, limited ROM d/t pain, able to move ankle though, leg is externally rotated, NVI  Other extremities are atraumatic with painless ROM and NVI.  Assessment / Plan: Active Problems:   * No active hospital problems. *    Will plan to take the patient to the OR tomorrow morning for a right THA due to the location and severity of the fracture which has compromised the blood supply to the femoral head. Make NPO at midnight.   Weightbearing: NWB RLE Showering: fall risk VTE prophylaxis:  ASA on hold   Pain control: limit narcotics as able due to age Contact information:  Edmonia Lynch MD, [facility] PA-C  Britt Bottom PA-C Office [phone] 03/06/2023 12:46 PM

## 2023-03-06 NOTE — ED Triage Notes (Signed)
Pt BIB [facility] from home as Level 2 Fall on Thinners. She was wearing socks & fell forward 3 steps into her garage & landed on concrete, using her hands to block her face & denies hitting her head or LOC. Does take thinners but cannot recall what kind she takes, c/o Rt sided hip pain. EMS arrived & states she was A/Ox4, outward Rt sided hip rotation noted, sheet used as pelvic binder, c-collar in place, 152/78, 74 bpm, 18 resp, 98% on RA, CBG 132.

## 2023-03-06 NOTE — ED Notes (Signed)
RN Arby Barrette informed me that the HR @43  was not outside of pt norm at this time.

## 2023-03-06 NOTE — ED Notes (Signed)
Patient transported to CT 

## 2023-03-06 NOTE — H&P (Cosign Needed Addendum)
Date: 03/06/2023               Patient Name:  [name] MRN: [id number]  DOB: 1941/09/04 Age / Sex: 82 y.o., adult   PCP: Nolene Ebbs, MD         Medical Service: Internal Medicine Teaching Service         Attending Physician: Dr. Sid Falcon, MD      First Contact: Dr. Leigh Aurora, DO Pager [phone]    Second Contact: Dr. Gaylan Gerold, DO Pager [phone]         After Hours (After 5p/  First Contact Pager: [phone]  weekends / holidays): Second Contact Pager: [phone]   SUBJECTIVE   Chief Complaint: Fall on thinners   History of Present Illness:  [name] is a 82 year old female living with type 2 diabetes, hypertension, CAD, STEMI 2015 status post PCI, gout, who presents to the emergency room after a fall with loss of consciousness.  Patient was rushing to get to the bus morning and slipped off her stairs and fell onto the car in the garage.  She landed her right hip on the car without head impact.  She had loss of consciousness after the fall.  Daughter started CPR on patient but reports that she still had a pulse.  Patient woke up approximately 3 minutes after.  She has no urinary/bowel incontinence or jerking motions.  She reports good p.o. intake for the last few days but only had 1 cup of water this morning.  Patient denies any presyncope symptoms or dizziness prior to the fall.  She denies palpitation, orthostatic symptoms or vertigo.  She has had prior syncopes, last episode a few months ago due to constipation.  She also reports intermittent syncope with straining and bowel movements with prodrome of feeling sweaty and hot.  Last BM was last night, normal appearance with no hematochezia or melena.  She was seen by her PCP for this issue but she does not remember any workup for this.  ED Course: In the ED, patient was normotensive on the lower side.  Her blood work was unremarkable.  Hip x-ray showed moderately displaced proximal right femoral neck fracture.   Orthopedic was consulted.  Patient is admitted for femur fracture as well as workup for recurrent syncope.  Past Medical History Past Medical History:  Diagnosis Date   Arthritis    Coronary artery disease    a. Inf STEMI (03/2014) => LHC (03/25/14):  Ostial/prox RI 70-80%, mid RI 95%, mid RCA occluded (culprit).  EF 60% with inferobasal HK.  RI small in distribution and med Rx recommended.  PCI:  Overlapping Xience Alpine (2.5 x 12 mm and 3 x 8 mm) DES to mid RCA. // b. Myoview 6/18: EF 50, no ischemia; Low Risk    Hyperlipidemia    Hypertension    Microcytic anemia    ST elevation myocardial infarction (STEMI) of inferior wall ([facility]) 03/25/2014   DES x2 RCA       Meds:  Aspirin 81 mg daily Allopurinol 100 mg daily Metoprolol succinate 50 mg daily Prevagen 10 mg daily Metformin 500 mg daily Lisinopril 20 mg daily Rosuvastatin 40 mg daily, unclear if patient is taking Colchicine 0.6 mg daily  Past Surgical History  Past Surgical History:  Procedure Laterality Date   BREAST BIOPSY Right 07/27/2011   CATARACT EXTRACTION     CORONARY ANGIOPLASTY WITH STENT PLACEMENT  03/25/2014   LAD irreg, RI 80%/95% (med Rx), RCA 100%-->0 w/  overlapping DES x 2, EF OK.   LEFT HEART CATH N/A 03/25/2014   Procedure: LEFT HEART CATH;  Surgeon: Sinclair Grooms, MD;  Location: [facility] CATH LAB;  Service: Cardiovascular;  Laterality: N/A;   PERCUTANEOUS CORONARY STENT INTERVENTION (PCI-S)  03/25/2014   Procedure: PERCUTANEOUS CORONARY STENT INTERVENTION (PCI-S);  Surgeon: Sinclair Grooms, MD;  Location: [facility] CATH LAB;  Service: Cardiovascular;;    Social:  Lives With: Daughter and son-in-law Occupation: retired Support: Good support system Level of Function: Independent with ADLs PCP: Nolene Ebbs, MD Substances: No alcohol, smoking or drug use  Family History:  Family History  Problem Relation Age of Onset   Stroke Sister      Allergies: Allergies as of 03/06/2023   (No Known Allergies)     Review of Systems: A complete ROS was negative except as per HPI.   OBJECTIVE:   Physical Exam: Blood pressure 124/63, pulse 68, temperature 97.8 F (36.6 C), temperature source Oral, resp. rate 18, height 5\' 4"  (1.626 m), weight 70.8 kg, SpO2 97 %.  Constitutional: Resting in bed, no acute distress HENT: normocephalic atraumatic Eyes: conjunctiva non-erythematous Cardiovascular: regular rate and rhythm, no m/r/g Pulmonary/Chest: normal work of breathing on room air, lungs clear to auscultation bilaterally Abdominal: soft, non-tender, non-distended, normoactive bowel sounds Extremities: 2+ pulses to bilateral lower extremities, significantly decreased range of motion of right hip joint secondary to pain.  Splint in place on right hip. Neurological: Sensation intact to bilateral upper and lower extremities Skin: warm and dry  Labs: CBC    Component Value Date/Time   WBC 5.6 03/06/2023 1122   RBC 5.04 03/06/2023 1122   HGB 13.0 03/06/2023 1122   HCT 39.0 03/06/2023 1122   PLT 141 (L) 03/06/2023 1122   MCV 77.4 (L) 03/06/2023 1122   MCH 25.8 (L) 03/06/2023 1122   MCHC 33.3 03/06/2023 1122   RDW 14.7 03/06/2023 1122   LYMPHSABS 2.5 03/06/2023 1122   MONOABS 0.4 03/06/2023 1122   EOSABS 0.0 03/06/2023 1122   BASOSABS 0.0 03/06/2023 1122     CMP     Component Value Date/Time   NA 139 03/06/2023 1122   NA 142 11/06/2020 0948   K 3.4 (L) 03/06/2023 1122   CL 102 03/06/2023 1122   CO2 26 03/06/2023 1122   GLUCOSE 130 (H) 03/06/2023 1122   BUN 15 03/06/2023 1122   BUN 15 11/06/2020 0948   CREATININE 0.96 03/06/2023 1122   CREATININE 0.83 02/24/2022 0000   CALCIUM 9.5 03/06/2023 1122   PROT 7.2 03/06/2023 1122   PROT 7.3 11/06/2020 0948   ALBUMIN 4.1 03/06/2023 1122   ALBUMIN 4.7 11/06/2020 0948   AST 24 03/06/2023 1122   ALT 20 03/06/2023 1122   ALKPHOS 63 03/06/2023 1122   BILITOT 0.7 03/06/2023 1122   BILITOT 0.3 11/06/2020 0948   GFRNONAA 60 (L) 03/06/2023  1122   GFRNONAA 72 01/27/2021 0000   GFRAA 83 01/27/2021 0000    Imaging:  Chest x-ray: No active cardiopulmonary process  Right hip x-ray: Moderately displaced proximal right femoral neck fracture  CT head: No acute intracranial abnormalities  EKG: Awaiting for upload. ASSESSMENT & PLAN:   Assessment & Plan by Problem: Principal Problem:   Closed displaced fracture of right femoral neck (HCC) Active Problems:   Syncope   Hypokalemia   Hypotension   Diabetes mellitus type 2 in nonobese [facility])   CAD (coronary atherosclerotic disease)   [name] is a 82 y.o. person living  with a history of CAD status post STEMI 2015 with drug-eluting stent x 2, hypertension, type 2 diabetes, hyperlipidemia who presents to the emergency room after a fall.  Patient admitted for further evaluation and management of acute right proximal femoral neck fracture and workup of syncope.  #Acute right proximal femoral neck fracture #Osteopenia Patient was coming downstairs today, slipped and fell, and had immediate pain.  Patient fell about 3 steps.  Patient also had positive loss of consciousness.  No head impact.  Patient presented emergency room.  Imaging revealed proximal right femoral neck fracture.  CT head was negative for any intracranial bleed.  Orthopedics consulted, recommending surgery tomorrow.  Reviewed charting, patient had bone density scan 4 years ago consistent with osteopenia.  Could be contributing as well.  Will manage patient's pain during hospitalization and coordinate PT for her. -Multimodal pain control with Tylenol, Dilaudid, and oxycodone -Vitamin D pending -PT/OT to evaluate after surgery -Orthopedics following, appreciate recs  #Syncope  Over the past few years, patient has had multiple syncopal episodes.  Per family, patient's most recent syncope episode was about 2 months ago, where she was sitting in the car, and passed out after becoming hot.  Per family patient has  these syncopal episodes during bowel movements as well and when she is constipated.  On exam, patient did not seem hypovolemic, but patient did have softer blood pressures.  Also while in the room, patient had heart rates that trended from the 40s to the 70s.  Patient has a description of today's episode as well as other episodes, but do not think these are seizures.Marland Kitchen Her heart rates seem to jump up and down during my talk with her, I wonder if she has a etiology of sick sinus syndrome. Patient did get morphine prior to my evaluation which could be contributing to her hypotension and bradycardia.  Another etiology could potentially be vasovagal in the setting of pain or straining.  Likely today's syncopal episode due to pain causing a vasovagal syncope.  Would like to continue to workup by obtaining ECHO to evaluate for any valvular abnormalities.  -Echo pending -Monitor on telemetry -After surgery will obtain orthostatics -Hold home hypertensive medications -EKG pending  #Hypokalemia Potassium 3.4 on admission.  Will replete and recheck in the morning.  Will also check magnesium in the morning. -Mag in a.m. -BMP in AM  #Hypertension Patient slightly hypotensive during hospitalization initially.  Most recent blood pressure 127/63.  Did have a low of systolics into the 0000000.  Patient states she did not take her antitetanus hypertensive medications today.  Home antihypertensive medications include metoprolol succinate 50 mg, lisinopril 20 mg. -Hold home BP meds  -Monitor blood pressure closely  -Fluids with LR 500 cc bolus  #History of CAD #Status post drug-eluting stent x 2 in 2015 #Hyperlipidemia Patient with past medical history of CAD with STEMI in 2015 status post drug-eluting stent x 2.  Patient currently on aspirin and rosuvastatin 40 mg at home.  Unclear if patient is actually taking her rosuvastatin.  No acute concerns for MI at this time. -Hold aspirin -Can resume rosuvastatin 40 mg  daily  #Type 2 diabetes mellitus Patient has a history of type 2 diabetes mellitus.  Last A1c greater than 3 years ago at 6.3.  Will obtain A1c. Monitor CBGs. -A1c pending  -Monitor CBGs   Diet: Normal VTE: SCDs IVF: LR 500 cc bolus Code: Full  Prior to Admission Living Arrangement: Home, living with daughter and son-in-law Anticipated Discharge  Location: Home Barriers to Discharge: Clinical improvement, surgery, PT evaluation, OT evaluation  Dispo: Admit patient to Inpatient with expected length of stay greater than 2 midnights.  Signed: Leigh Aurora, DO Internal Medicine Resident PGY-1 Pager: [phone] 03/06/2023, 2:13 PM   On weekends or after 5pm please page on call intern or resident: First contact: [phone] If no answer in 15 minutes, please contact senior pager at [phone]

## 2023-03-07 ENCOUNTER — Inpatient Hospital Stay (HOSPITAL_COMMUNITY): Payer: Medicare HMO | Admitting: Anesthesiology

## 2023-03-07 ENCOUNTER — Inpatient Hospital Stay (HOSPITAL_COMMUNITY): Payer: Medicare HMO

## 2023-03-07 ENCOUNTER — Other Ambulatory Visit: Payer: Self-pay

## 2023-03-07 ENCOUNTER — Encounter (HOSPITAL_COMMUNITY)
Admission: EM | Disposition: A | Discharge status: Home Health | Payer: Self-pay | Source: Home / Self Care | Attending: Internal Medicine

## 2023-03-07 ENCOUNTER — Encounter (HOSPITAL_COMMUNITY): Payer: Self-pay | Admitting: Internal Medicine

## 2023-03-07 ENCOUNTER — Other Ambulatory Visit (HOSPITAL_COMMUNITY): Payer: Medicare HMO

## 2023-03-07 DIAGNOSIS — I2510 Atherosclerotic heart disease of native coronary artery without angina pectoris: Secondary | ICD-10-CM | POA: Diagnosis not present

## 2023-03-07 DIAGNOSIS — I252 Old myocardial infarction: Secondary | ICD-10-CM | POA: Diagnosis not present

## 2023-03-07 DIAGNOSIS — I10 Essential (primary) hypertension: Secondary | ICD-10-CM

## 2023-03-07 DIAGNOSIS — S72001A Fracture of unspecified part of neck of right femur, initial encounter for closed fracture: Secondary | ICD-10-CM

## 2023-03-07 DIAGNOSIS — M8588 Other specified disorders of bone density and structure, other site: Secondary | ICD-10-CM

## 2023-03-07 DIAGNOSIS — D72829 Elevated white blood cell count, unspecified: Secondary | ICD-10-CM

## 2023-03-07 DIAGNOSIS — E559 Vitamin D deficiency, unspecified: Secondary | ICD-10-CM | POA: Diagnosis not present

## 2023-03-07 HISTORY — PX: TOTAL HIP ARTHROPLASTY: SHX124

## 2023-03-07 LAB — BASIC METABOLIC PANEL
Anion gap: 8 (ref 5–15)
Anion gap: 9 (ref 5–15)
BUN: 16 mg/dL (ref 8–23)
BUN: 11 mg/dL (ref 8–23)
CO2: 24 mmol/L (ref 22–32)
CO2: 23 mmol/L (ref 22–32)
Calcium: 9.1 mg/dL (ref 8.9–10.3)
Calcium: 9 mg/dL (ref 8.9–10.3)
Chloride: 104 mmol/L (ref 98–111)
Chloride: 104 mmol/L (ref 98–111)
Creatinine, Ser: 0.92 mg/dL (ref 0.44–1.00)
Creatinine, Ser: 1.05 mg/dL — ABNORMAL HIGH (ref 0.44–1.00)
GFR, Estimated: >60 mL/min (ref >60)
GFR, Estimated: 54 mL/min — ABNORMAL LOW (ref >60)
Glucose, Bld: 123 mg/dL — ABNORMAL HIGH (ref 70–99)
Glucose, Bld: 178 mg/dL — ABNORMAL HIGH (ref 70–99)
Potassium: 5.1 mmol/L (ref 3.5–5.1)
Potassium: 4.5 mmol/L (ref 3.5–5.1)
Sodium: 136 mmol/L (ref 135–145)
Sodium: 136 mmol/L (ref 135–145)

## 2023-03-07 LAB — CBC
HCT: 34.5 % — ABNORMAL LOW (ref 36.0–46.0)
Hemoglobin: 11.2 g/dL — ABNORMAL LOW (ref 12.0–15.0)
MCH: 25.4 pg — ABNORMAL LOW (ref 26.0–34.0)
MCHC: 32.5 g/dL (ref 30.0–36.0)
MCV: 78.2 fL — ABNORMAL LOW (ref 80.0–100.0)
Platelets: 121 10*3/uL — ABNORMAL LOW (ref 150–400)
RBC: 4.41 MIL/uL (ref 3.87–5.11)
RDW: 14.6 % (ref 11.5–15.5)
WBC: 11 10*3/uL — ABNORMAL HIGH (ref 4.0–10.5)
nRBC: 0 % (ref 0.0–0.2)

## 2023-03-07 LAB — GLUCOSE, CAPILLARY
Glucose-Capillary: 113 mg/dL — ABNORMAL HIGH (ref 70–99)
Glucose-Capillary: 105 mg/dL — ABNORMAL HIGH (ref 70–99)
Glucose-Capillary: 133 mg/dL — ABNORMAL HIGH (ref 70–99)
Glucose-Capillary: 153 mg/dL — ABNORMAL HIGH (ref 70–99)

## 2023-03-07 LAB — SURGICAL PCR SCREEN
MRSA, PCR: NEGATIVE
Staphylococcus aureus: NEGATIVE

## 2023-03-07 LAB — VITAMIN D 25 HYDROXY (VIT D DEFICIENCY, FRACTURES): Vit D, 25-Hydroxy: 19.89 ng/mL — ABNORMAL LOW (ref 30–100)

## 2023-03-07 LAB — MAGNESIUM
Magnesium: 2.1 mg/dL (ref 1.7–2.4)
Magnesium: 2.2 mg/dL (ref 1.7–2.4)

## 2023-03-07 SURGERY — ARTHROPLASTY, HIP, TOTAL, ANTERIOR APPROACH
Anesthesia: General | Site: Hip | Laterality: Right

## 2023-03-07 MED ORDER — VITAMIN D (ERGOCALCIFEROL) 1.25 MG (50000 UNIT) PO CAPS
50000.0000 [IU] | ORAL_CAPSULE | ORAL | Status: DC
  Administered 2023-03-07: 50000 [IU] via ORAL
  Filled 2023-03-07: qty 1

## 2023-03-07 MED ORDER — OXYCODONE HCL 5 MG PO TABS
5.0000 mg | ORAL_TABLET | ORAL | Status: DC | PRN
  Administered 2023-03-08 – 2023-03-09 (×3): 5 mg via ORAL
  Filled 2023-03-07 (×3): qty 1

## 2023-03-07 MED ORDER — DEXAMETHASONE SODIUM PHOSPHATE 10 MG/ML IJ SOLN
10.0000 mg | Freq: Once | INTRAMUSCULAR | Status: AC
  Administered 2023-03-08: 10 mg via INTRAVENOUS
  Filled 2023-03-07: qty 1

## 2023-03-07 MED ORDER — ENOXAPARIN SODIUM 40 MG/0.4ML IJ SOSY
40.0000 mg | PREFILLED_SYRINGE | INTRAMUSCULAR | Status: DC
  Administered 2023-03-08: 40 mg via SUBCUTANEOUS
  Filled 2023-03-07: qty 0.4

## 2023-03-07 MED ORDER — METOCLOPRAMIDE HCL 5 MG PO TABS: 5.0000 mg | ORAL_TABLET | Freq: Three times a day (TID) | ORAL | Status: DC | PRN

## 2023-03-07 MED ORDER — MAGNESIUM CITRATE PO SOLN: 1.0000 | Freq: Once | ORAL | Status: DC | PRN

## 2023-03-07 MED ORDER — DIPHENHYDRAMINE HCL 12.5 MG/5ML PO ELIX: 12.5000 mg | ORAL_SOLUTION | ORAL | Status: DC | PRN

## 2023-03-07 MED ORDER — METOPROLOL SUCCINATE ER 50 MG PO TB24
50.0000 mg | ORAL_TABLET | Freq: Every day | ORAL | Status: DC
  Administered 2023-03-07 – 2023-03-09 (×3): 50 mg via ORAL
  Filled 2023-03-07 (×3): qty 1

## 2023-03-07 MED ORDER — ACETAMINOPHEN 500 MG PO TABS
ORAL_TABLET | ORAL | Status: AC
  Filled 2023-03-07: qty 2

## 2023-03-07 MED ORDER — OXYCODONE HCL 5 MG PO TABS
10.0000 mg | ORAL_TABLET | Freq: Four times a day (QID) | ORAL | Status: DC | PRN
  Filled 2023-03-07: qty 2

## 2023-03-07 MED ORDER — SODIUM CHLORIDE (PF) 0.9 % IJ SOLN
INTRAMUSCULAR | Status: DC | PRN
  Administered 2023-03-07: 60 mL

## 2023-03-07 MED ORDER — ONDANSETRON HCL 4 MG/2ML IJ SOLN
INTRAMUSCULAR | Status: DC | PRN
  Administered 2023-03-07: 4 mg via INTRAVENOUS

## 2023-03-07 MED ORDER — ONDANSETRON HCL 4 MG/2ML IJ SOLN: INTRAMUSCULAR | Status: DC | PRN

## 2023-03-07 MED ORDER — METHOCARBAMOL 1000 MG/10ML IJ SOLN: 500.0000 mg | Freq: Four times a day (QID) | INTRAVENOUS | Status: DC | PRN

## 2023-03-07 MED ORDER — ACETAMINOPHEN 325 MG PO TABS: 325.0000 mg | ORAL_TABLET | Freq: Four times a day (QID) | ORAL | Status: DC | PRN

## 2023-03-07 MED ORDER — BUPIVACAINE HCL (PF) 0.25 % IJ SOLN
INTRAMUSCULAR | Status: AC
  Filled 2023-03-07: qty 20

## 2023-03-07 MED ORDER — FENTANYL CITRATE (PF) 250 MCG/5ML IJ SOLN
INTRAMUSCULAR | Status: AC
  Filled 2023-03-07: qty 5

## 2023-03-07 MED ORDER — PROPOFOL 10 MG/ML IV BOLUS
INTRAVENOUS | Status: DC | PRN
  Administered 2023-03-07: 100 mg via INTRAVENOUS

## 2023-03-07 MED ORDER — AMISULPRIDE (ANTIEMETIC) 5 MG/2ML IV SOLN: 10.0000 mg | Freq: Once | INTRAVENOUS | Status: DC | PRN

## 2023-03-07 MED ORDER — ACETAMINOPHEN 500 MG PO TABS
1000.0000 mg | ORAL_TABLET | Freq: Four times a day (QID) | ORAL | Status: DC
  Filled 2023-03-07: qty 2

## 2023-03-07 MED ORDER — ALUM & MAG HYDROXIDE-SIMETH 200-200-20 MG/5ML PO SUSP: 30.0000 mL | ORAL | Status: DC | PRN

## 2023-03-07 MED ORDER — ONDANSETRON HCL 4 MG/2ML IJ SOLN: 4.0000 mg | Freq: Four times a day (QID) | INTRAMUSCULAR | Status: DC | PRN

## 2023-03-07 MED ORDER — ONDANSETRON HCL 4 MG PO TABS: 4.0000 mg | ORAL_TABLET | Freq: Four times a day (QID) | ORAL | Status: DC | PRN

## 2023-03-07 MED ORDER — POLYETHYLENE GLYCOL 3350 17 G PO PACK
17.0000 g | PACK | Freq: Every day | ORAL | Status: DC | PRN
  Administered 2023-03-08: 17 g via ORAL
  Filled 2023-03-07: qty 1

## 2023-03-07 MED ORDER — FENTANYL CITRATE (PF) 250 MCG/5ML IJ SOLN
INTRAMUSCULAR | Status: DC | PRN
  Administered 2023-03-07 (×2): 50 ug via INTRAVENOUS
  Administered 2023-03-07 (×3): 25 ug via INTRAVENOUS

## 2023-03-07 MED ORDER — INSULIN ASPART 100 UNIT/ML IJ SOLN: 0.0000 [IU] | INTRAMUSCULAR | Status: DC | PRN

## 2023-03-07 MED ORDER — LIDOCAINE 2% (20 MG/ML) 5 ML SYRINGE
INTRAMUSCULAR | Status: DC | PRN
  Administered 2023-03-07: 70 mg via INTRAVENOUS

## 2023-03-07 MED ORDER — LACTATED RINGERS IV SOLN: INTRAVENOUS | Status: DC

## 2023-03-07 MED ORDER — CHLORHEXIDINE GLUCONATE 0.12 % MT SOLN: 15.0000 mL | Freq: Once | OROMUCOSAL | Status: AC

## 2023-03-07 MED ORDER — ACETAMINOPHEN 500 MG PO TABS
1000.0000 mg | ORAL_TABLET | Freq: Three times a day (TID) | ORAL | Status: DC
  Administered 2023-03-07 – 2023-03-09 (×7): 1000 mg via ORAL
  Filled 2023-03-07 (×6): qty 2

## 2023-03-07 MED ORDER — FENTANYL CITRATE (PF) 100 MCG/2ML IJ SOLN: 25.0000 ug | INTRAMUSCULAR | Status: DC | PRN

## 2023-03-07 MED ORDER — PHENOL 1.4 % MT LIQD: 1.0000 | OROMUCOSAL | Status: DC | PRN

## 2023-03-07 MED ORDER — 0.9 % SODIUM CHLORIDE (POUR BTL) OPTIME
TOPICAL | Status: DC | PRN
  Administered 2023-03-07: 1000 mL

## 2023-03-07 MED ORDER — ROCURONIUM BROMIDE 10 MG/ML (PF) SYRINGE
PREFILLED_SYRINGE | INTRAVENOUS | Status: DC | PRN
  Administered 2023-03-07: 50 mg via INTRAVENOUS

## 2023-03-07 MED ORDER — HYDROMORPHONE HCL 1 MG/ML IJ SOLN: 0.5000 mg | INTRAMUSCULAR | Status: DC | PRN

## 2023-03-07 MED ORDER — TRANEXAMIC ACID-NACL 1000-0.7 MG/100ML-% IV SOLN
INTRAVENOUS | Status: AC
  Filled 2023-03-07: qty 100

## 2023-03-07 MED ORDER — PANTOPRAZOLE SODIUM 40 MG PO TBEC
40.0000 mg | DELAYED_RELEASE_TABLET | Freq: Every day | ORAL | Status: DC
  Administered 2023-03-07 – 2023-03-09 (×3): 40 mg via ORAL
  Filled 2023-03-07 (×3): qty 1

## 2023-03-07 MED ORDER — ORAL CARE MOUTH RINSE: 15.0000 mL | Freq: Once | OROMUCOSAL | Status: AC

## 2023-03-07 MED ORDER — METOPROLOL TARTRATE 5 MG/5ML IV SOLN: 5.0000 mg | INTRAVENOUS | Status: DC | PRN

## 2023-03-07 MED ORDER — CEFAZOLIN SODIUM-DEXTROSE 2-4 GM/100ML-% IV SOLN
INTRAVENOUS | Status: AC
  Filled 2023-03-07: qty 100

## 2023-03-07 MED ORDER — METOCLOPRAMIDE HCL 5 MG/ML IJ SOLN: 5.0000 mg | Freq: Three times a day (TID) | INTRAMUSCULAR | Status: DC | PRN

## 2023-03-07 MED ORDER — HYDROMORPHONE HCL 1 MG/ML IJ SOLN
0.5000 mg | INTRAMUSCULAR | Status: DC | PRN
  Administered 2023-03-08: 0.5 mg via INTRAVENOUS
  Filled 2023-03-07: qty 0.5

## 2023-03-07 MED ORDER — ENOXAPARIN SODIUM 40 MG/0.4ML IJ SOSY: 40.0000 mg | PREFILLED_SYRINGE | INTRAMUSCULAR | Status: DC

## 2023-03-07 MED ORDER — MENTHOL 3 MG MT LOZG: 1.0000 | LOZENGE | OROMUCOSAL | Status: DC | PRN

## 2023-03-07 MED ORDER — METHOCARBAMOL 500 MG PO TABS
500.0000 mg | ORAL_TABLET | Freq: Four times a day (QID) | ORAL | Status: DC | PRN
  Administered 2023-03-08: 500 mg via ORAL
  Filled 2023-03-07: qty 1

## 2023-03-07 MED ORDER — DEXAMETHASONE SODIUM PHOSPHATE 10 MG/ML IJ SOLN
INTRAMUSCULAR | Status: DC | PRN
  Administered 2023-03-07: 8 mg via INTRAVENOUS

## 2023-03-07 MED ORDER — MUPIROCIN 2 % EX OINT
1.0000 | TOPICAL_OINTMENT | Freq: Two times a day (BID) | CUTANEOUS | Status: DC
  Administered 2023-03-07 – 2023-03-08 (×2): 1 via NASAL
  Filled 2023-03-07 (×2): qty 22

## 2023-03-07 MED ORDER — PHENYLEPHRINE 80 MCG/ML (10ML) SYRINGE FOR IV PUSH (FOR BLOOD PRESSURE SUPPORT)
PREFILLED_SYRINGE | INTRAVENOUS | Status: DC | PRN
  Administered 2023-03-07: 160 ug via INTRAVENOUS
  Administered 2023-03-07: 80 ug via INTRAVENOUS

## 2023-03-07 MED ORDER — CHLORHEXIDINE GLUCONATE 0.12 % MT SOLN
OROMUCOSAL | Status: AC
  Administered 2023-03-07: 15 mL via OROMUCOSAL
  Filled 2023-03-07: qty 15

## 2023-03-07 MED ORDER — BISACODYL 10 MG RE SUPP
10.0000 mg | Freq: Every day | RECTAL | Status: DC | PRN
  Administered 2023-03-08: 10 mg via RECTAL
  Filled 2023-03-07: qty 1

## 2023-03-07 MED ORDER — OXYCODONE HCL 5 MG PO TABS: 5.0000 mg | ORAL_TABLET | ORAL | Status: DC | PRN

## 2023-03-07 MED ORDER — CEFAZOLIN SODIUM-DEXTROSE 1-4 GM/50ML-% IV SOLN
1.0000 g | Freq: Four times a day (QID) | INTRAVENOUS | Status: AC
  Administered 2023-03-07 (×2): 1 g via INTRAVENOUS
  Filled 2023-03-07 (×3): qty 50

## 2023-03-07 MED ORDER — SENNOSIDES-DOCUSATE SODIUM 8.6-50 MG PO TABS
1.0000 | ORAL_TABLET | Freq: Two times a day (BID) | ORAL | Status: DC
  Administered 2023-03-07 – 2023-03-09 (×3): 1 via ORAL
  Filled 2023-03-07 (×4): qty 1

## 2023-03-07 MED ORDER — PHENYLEPHRINE HCL-NACL 20-0.9 MG/250ML-% IV SOLN
INTRAVENOUS | Status: DC | PRN
  Administered 2023-03-07: 25 ug/min via INTRAVENOUS

## 2023-03-07 MED ORDER — ADENOSINE 6 MG/2ML IV SOLN
6.0000 mg | Freq: Once | INTRAVENOUS | Status: AC
  Administered 2023-03-07: 6 mg via INTRAVENOUS
  Filled 2023-03-07 (×3): qty 2

## 2023-03-07 MED ORDER — DEXAMETHASONE SODIUM PHOSPHATE 10 MG/ML IJ SOLN
INTRAMUSCULAR | Status: AC
  Filled 2023-03-07: qty 1

## 2023-03-07 MED ORDER — ACETAMINOPHEN 500 MG PO TABS: 1000.0000 mg | ORAL_TABLET | Freq: Once | ORAL | Status: DC

## 2023-03-07 MED ORDER — DOCUSATE SODIUM 100 MG PO CAPS
100.0000 mg | ORAL_CAPSULE | Freq: Two times a day (BID) | ORAL | Status: DC
  Administered 2023-03-07 – 2023-03-09 (×3): 100 mg via ORAL
  Filled 2023-03-07 (×4): qty 1

## 2023-03-07 MED ORDER — BUPIVACAINE LIPOSOME 1.3 % IJ SUSP
INTRAMUSCULAR | Status: AC
  Filled 2023-03-07: qty 20

## 2023-03-07 MED ORDER — TRANEXAMIC ACID-NACL 1000-0.7 MG/100ML-% IV SOLN
1000.0000 mg | Freq: Once | INTRAVENOUS | Status: AC
  Administered 2023-03-07: 1000 mg via INTRAVENOUS
  Filled 2023-03-07: qty 100

## 2023-03-07 MED ORDER — STERILE WATER FOR IRRIGATION IR SOLN
Status: DC | PRN
  Administered 2023-03-07: 1000 mL

## 2023-03-07 MED ORDER — SUGAMMADEX SODIUM 200 MG/2ML IV SOLN
INTRAVENOUS | Status: DC | PRN
  Administered 2023-03-07: 150 mg via INTRAVENOUS

## 2023-03-07 SURGICAL SUPPLY — 60 items
BAG COUNTER SPONGE SURGICOUNT (BAG) ×1 IMPLANT
BAG SPNG CNTER NS LX DISP (BAG) ×1
BLADE SAG 18X100X1.27 (BLADE) IMPLANT
CLSR STERI-STRIP ANTIMIC 1/2X4 (GAUZE/BANDAGES/DRESSINGS) ×1 IMPLANT
COVER PERINEAL POST (MISCELLANEOUS) ×1 IMPLANT
COVER SURGICAL LIGHT HANDLE (MISCELLANEOUS) ×1 IMPLANT
DRAPE C-ARM 42X72 X-RAY (DRAPES) ×1 IMPLANT
DRAPE STERI IOBAN 125X83 (DRAPES) ×1 IMPLANT
DRAPE U-SHAPE 47X51 STRL (DRAPES) IMPLANT
DRSG MEPILEX POST OP 4X8 (GAUZE/BANDAGES/DRESSINGS) ×1 IMPLANT
DURAPREP 26ML APPLICATOR (WOUND CARE) ×1 IMPLANT
ELECT BLADE 4.0 EZ CLEAN MEGAD (MISCELLANEOUS) ×1
ELECT REM PT RETURN 9FT ADLT (ELECTROSURGICAL) ×1
ELECTRODE BLDE 4.0 EZ CLN MEGD (MISCELLANEOUS) ×1 IMPLANT
ELECTRODE REM PT RTRN 9FT ADLT (ELECTROSURGICAL) ×1 IMPLANT
FACESHIELD WRAPAROUND (MASK) ×3 IMPLANT
FACESHIELD WRAPAROUND OR TEAM (MASK) ×1 IMPLANT
GLOVE BIO SURGEON STRL SZ7.5 (GLOVE) ×1 IMPLANT
GLOVE BIOGEL PI IND STRL 7.5 (GLOVE) ×1 IMPLANT
GLOVE BIOGEL PI IND STRL 8 (GLOVE) ×1 IMPLANT
GLOVE SURG SYN 7.5  E (GLOVE) ×1
GLOVE SURG SYN 7.5 E (GLOVE) ×1 IMPLANT
GLOVE SURG SYN 7.5 PF PI (GLOVE) ×1 IMPLANT
GOWN STRL REUS W/ TWL LRG LVL3 (GOWN DISPOSABLE) ×2 IMPLANT
GOWN STRL REUS W/ TWL XL LVL3 (GOWN DISPOSABLE) ×1 IMPLANT
GOWN STRL REUS W/TWL LRG LVL3 (GOWN DISPOSABLE) ×2
GOWN STRL REUS W/TWL XL LVL3 (GOWN DISPOSABLE) ×1
HEAD BIOLOX HIP 36/-5 (Joint) IMPLANT
HIP BIOLOX HD 36/-5 (Joint) ×1 IMPLANT
INSERT 0 DEGREE 36 (Miscellaneous) IMPLANT
KIT BASIN OR (CUSTOM PROCEDURE TRAY) ×1 IMPLANT
KIT TURNOVER KIT B (KITS) ×1 IMPLANT
MANIFOLD NEPTUNE II (INSTRUMENTS) ×1 IMPLANT
NDL 18GX1X1/2 (RX/OR ONLY) (NEEDLE) IMPLANT
NEEDLE 18GX1X1/2 (RX/OR ONLY) (NEEDLE) IMPLANT
NEEDLE HYPO 22GX1.5 SAFETY (NEEDLE) ×1 IMPLANT
NS IRRIG 1000ML POUR BTL (IV SOLUTION) ×1 IMPLANT
PACK TOTAL JOINT (CUSTOM PROCEDURE TRAY) ×1 IMPLANT
PAD ARMBOARD 7.5X6 YLW CONV (MISCELLANEOUS) ×1 IMPLANT
SCREW HEX LP 6.5X20 (Screw) IMPLANT
SHELL ACETAB TRIDENT 48 (Shell) IMPLANT
SPONGE T-LAP 18X18 ~~LOC~~+RFID (SPONGE) IMPLANT
STEM HIP 4 127DEG (Stem) IMPLANT
SUT MNCRL AB 4-0 PS2 18 (SUTURE) ×1 IMPLANT
SUT STRATAFIX 1PDS 45CM VIOLET (SUTURE) ×1 IMPLANT
SUT VIC AB 0 CT1 27 (SUTURE) ×1
SUT VIC AB 0 CT1 27XBRD ANBCTR (SUTURE) ×2 IMPLANT
SUT VIC AB 1 CT1 27 (SUTURE) ×1
SUT VIC AB 1 CT1 27XBRD ANBCTR (SUTURE) ×2 IMPLANT
SUT VIC AB 2-0 CT1 27 (SUTURE) ×1
SUT VIC AB 2-0 CT1 TAPERPNT 27 (SUTURE) ×2 IMPLANT
SYR 20ML LL LF (SYRINGE) IMPLANT
SYR 50ML LL SCALE MARK (SYRINGE) ×2 IMPLANT
SYR BULB IRRIG 60ML STRL (SYRINGE) ×1 IMPLANT
TOWEL GREEN STERILE (TOWEL DISPOSABLE) ×1 IMPLANT
TOWEL GREEN STERILE FF (TOWEL DISPOSABLE) ×1 IMPLANT
TRAY CATH INTERMITTENT SS 16FR (CATHETERS) IMPLANT
TRAY FOLEY MTR SLVR 16FR STAT (SET/KITS/TRAYS/PACK) IMPLANT
WATER STERILE IRR 1000ML POUR (IV SOLUTION) ×1 IMPLANT
YANKAUER SUCT BULB TIP NO VENT (SUCTIONS) ×2 IMPLANT

## 2023-03-07 NOTE — Op Note (Signed)
03/06/2023 - 03/07/2023  11:17 AM  PATIENT:  [name]   MRN: [id number]  PRE-OPERATIVE DIAGNOSIS:  RIght Hip Fx  POST-OPERATIVE DIAGNOSIS:  RIght Hip Fx  PROCEDURE:  Procedure(s): TOTAL HIP ARTHROPLASTY ANTERIOR APPROACH  PREOPERATIVE INDICATIONS:    [name] is an 82 y.o. female who has a diagnosis of Closed displaced fracture of right femoral neck ([place]) and elected for surgical management after failing conservative treatment.  The risks benefits and alternatives were discussed with the patient including but not limited to the risks of nonoperative treatment, versus surgical intervention including infection, bleeding, nerve injury, periprosthetic fracture, the need for revision surgery, dislocation, leg length discrepancy, blood clots, cardiopulmonary complications, morbidity, mortality, among others, and they were willing to proceed.     OPERATIVE REPORT     SURGEON:   Renette Butters, MD    ASSISTANT:  Aggie Moats, PA-C, she was present and scrubbed throughout the case, critical for completion in a timely fashion, and for retraction, instrumentation, and closure.     ANESTHESIA:  General    COMPLICATIONS:  None.     COMPONENTS:  [employer]ker acolade fit femur size 4 with a 36 mm -5 head ball and an acetabular shell size 48 with a  polyethylene liner    PROCEDURE IN DETAIL:   The patient was met in the holding area and  identified.  The appropriate hip was identified and marked at the operative site.  The patient was then transported to the OR  and  placed under anesthesia per that record.  At that point, the patient was  placed in the supine position and  secured to the operating room table and all bony prominences padded. He received pre-operative antibiotics    The operative lower extremity was prepped from the iliac crest to the distal leg.  Sterile draping was performed.  Time out was performed prior to incision.      Skin incision was made just 2 cm lateral to the  ASIS  extending in line with the tensor fascia lata. Electrocautery was used to control all bleeders. I dissected down sharply to the fascia of the tensor fascia lata was confirmed that the muscle fibers beneath were running posteriorly. I then incised the fascia over the superficial tensor fascia lata in line with the incision. The fascia was elevated off the anterior aspect of the muscle the muscle was retracted posteriorly and protected throughout the case. I then used electrocautery to incise the tensor fascia lata fascia control and all bleeders. Immediately visible was the fat over top of the anterior neck and capsule.  I removed the anterior fat from the capsule and elevated the rectus muscle off of the anterior capsule. I then removed a large time of capsule. The retractors were then placed over the anterior acetabulum as well as around the superior and inferior neck.  I then made a femoral neck cut. And removed the fractured portion.  Then used the power corkscrew to remove the femoral head from the acetabulum and thoroughly irrigated the acetabulum. I sized the femoral head.    I then exposed the deep acetabulum, cleared out any tissue including the ligamentum teres.   After adequate visualization, I excised the labrum, and then sequentially reamed.  I then impacted the acetabular implant into place using fluoroscopy for guidance.  Appropriate version and inclination was confirmed clinically matching their bony anatomy, and with fluoroscopy.  I placed a 20 mm screw in the posterior/superio position with an  excellent bite.    I then placed the polyethylene liner in place  I then adducted the leg and released the external rotators from the posterior femur allowing it to be easily delivered up lateral and anterior to the acetabulum for preparation of the femoral canal.    I then prepared the proximal femur using the cookie-cutter and then sequentially reamed and broached.  A trial broach,  neck, and head was utilized, and I reduced the hip and used floroscopy to assess the neck length and femoral implant.  I then impacted the femoral prosthesis into place into the appropriate version. The hip was then reduced and fluoroscopy confirmed appropriate position. Leg lengths were restored.  I then irrigated the hip copiously again with, and repaired the fascia with Vicryl, followed by monocryl for the subcutaneous tissue, Monocryl for the skin, Steri-Strips and sterile gauze. The patient was then awakened and returned to PACU in stable and satisfactory condition. There were no complications.  POST OPERATIVE PLAN: WBAT, DVT px: SCD's/TED, ambulation and chemical dvt px  Edmonia Lynch, MD Orthopedic Surgeon [phone]

## 2023-03-07 NOTE — Plan of Care (Signed)
°  Problem: Education: °Goal: Knowledge of General Education information will improve °Description: Including pain rating scale, medication(s)/side effects and non-pharmacologic comfort measures °Outcome: Progressing °  °Problem: Health Behavior/Discharge Planning: °Goal: Ability to manage health-related needs will improve °Outcome: Progressing °  °Problem: Clinical Measurements: °Goal: Cardiovascular complication will be avoided °Outcome: Progressing °  °

## 2023-03-07 NOTE — Anesthesia Procedure Notes (Signed)
Procedure Name: Intubation Date/Time: 03/07/2023 10:03 AM  Performed by: Clearnce Sorrel, CRNAPre-anesthesia Checklist: Patient identified, Emergency Drugs available, Suction available and Patient being monitored Patient Re-evaluated:Patient Re-evaluated prior to induction Oxygen Delivery Method: Circle System Utilized Preoxygenation: Pre-oxygenation with 100% oxygen Induction Type: IV induction Ventilation: Mask ventilation without difficulty Laryngoscope Size: Mac and 3 Grade View: Grade I Tube type: Oral Tube size: 7.0 mm Number of attempts: 1 Airway Equipment and Method: Stylet and Oral airway Placement Confirmation: ETT inserted through vocal cords under direct vision, positive ETCO2 and breath sounds checked- equal and bilateral Secured at: 22 cm Tube secured with: Tape Dental Injury: Teeth and Oropharynx as per pre-operative assessment

## 2023-03-07 NOTE — Transfer of Care (Signed)
Immediate Anesthesia Transfer of Care Note  Patient: [name]  Procedure(s) Performed: TOTAL HIP ARTHROPLASTY ANTERIOR APPROACH (Right: Hip)  Patient Location: PACU  Anesthesia Type:General  Level of Consciousness: drowsy and patient cooperative  Airway & Oxygen Therapy: Patient Spontanous Breathing and Patient connected to nasal cannula oxygen  Post-op Assessment: Report given to RN and Post -op Vital signs reviewed and stable  Post vital signs: Reviewed and stable  Last Vitals:  Vitals Value Taken Time  BP 108/57 03/07/23 1202  Temp    Pulse 64 03/07/23 1205  Resp 18 03/07/23 1205  SpO2 92 % 03/07/23 1205  Vitals shown include unvalidated device data.  Last Pain:  Vitals:   03/07/23 0859  TempSrc:   PainSc: 4       Patients Stated Pain Goal: 0 ([id number])  Complications: No notable events documented.

## 2023-03-07 NOTE — Significant Event (Addendum)
Rapid Response Event Note   Reason for Call :  SVT-190s  Initial Focused Assessment:  Pt lying in bed with eyes open, in no visible distress. Pt alert and oriented, denies CP/SOB/dizziness. Lungs clear, diminished in the bases. Skin warm and dry.   HR-190s, BP-138/86, RR-22, SpO2-99% on RA  Pt placed on zoll monitor. 6mg  adenosine given x1 at 2021. Pt converted to SR-90s, BP-137/64.  Interventions:  EKG-wide complex tachycardia,  6mg  adenosine IV Metoprolol 50 mg daily Lopressor 5 mg IV q83m PRN for SVT w/ HR > 140  Plan of Care:  Pt converted to SR-90s. Cards consult may be beneficial if SVT occurs again. Continue to monitor closely. Call RRT if further assistance needed.   Event Summary:   MD Notified: Drs Humphrey Rolls and Mapp notified and came to bedside to assess. Call Time:1953 Arrival [id number] End [id number]  Dillard Essex, RN

## 2023-03-07 NOTE — Progress Notes (Signed)
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  Imaging consistent with acute right proximal femoral neck fracture, and patient admitted for further evaluation and management.  Overnight Events: Shortly after patient was brought up to floor, patient did have some hypotension and bradycardia.  Evaluated patient, and patient likely somnolent due to large dose of morphine.  Patient is amenable to Narcan, and blood pressure responded well.  Patient is lying comfortably in bed upon my exam.  She states that she is ready for surgery.  She states the pain is much better.  She still has good sensation to her lower extremities she states.  She denies any lightheadedness or dizziness.  Objective:  Vital signs in last 24 hours: Vitals:   03/07/23 0144 03/07/23 0414 03/07/23 0714 03/07/23 0847  BP: 127/64 120/74 129/61 (!) 155/61  Pulse: 85 73 80 68  Resp: 20 (!) 22 20 18   Temp:   99.3 F (37.4 C) 98.6 F (37 C)  TempSrc:   Oral Oral  SpO2: 95% 99% 98% 96%  Weight:      Height:       Supplemental O2: Room Air SpO2: 99 %   Physical Exam:  Constitutional: resting in bed no acute distress HENT: normocephalic atraumatic Cardiovascular: regular rate and rhythm, no m/r/g Pulmonary/Chest: normal work of breathing on room air, lungs clear to auscultation bilaterally Ext: 2+ pedal pulses appreciated bilaterally. Great sensation bilaterally. Right lower extremity with limited movement secondary to pain   Filed Weights   03/06/23 1141  Weight: 70.8 kg     Intake/Output Summary (Last 24 hours) at 03/07/2023 1136 Last data filed at 03/07/2023 1100 Gross per 24 hour  Intake 675.08 ml  Output 500 ml  Net 175.08 ml   Net IO Since Admission: 175.08 mL [03/07/23 1136]  Pertinent Labs:    Latest Ref  Rng & Units 03/07/2023    2:16 AM 03/06/2023   11:22 AM 02/24/2022   12:00 AM  CBC  WBC 4.0 - 10.5 K/uL 11.0  5.6  4.6   Hemoglobin 12.0 - 15.0 g/dL 11.2  13.0  13.4   Hematocrit 36.0 - 46.0 % 34.5  39.0  41.4   Platelets 150 - 400 K/uL 121  141  162        Latest Ref Rng & Units 03/07/2023    2:16 AM 03/06/2023   11:22 AM 02/24/2022   12:00 AM  CMP  Glucose 70 - 99 mg/dL 123  130  74   BUN 8 - 23 mg/dL 16  15  12    Creatinine 0.44 - 1.00 mg/dL 0.92  0.96  0.83   Sodium 135 - 145 mmol/L 136  139  139   Potassium 3.5 - 5.1 mmol/L 5.1  3.4  4.5   Chloride 98 - 111 mmol/L 104  102  102   CO2 22 - 32 mmol/L 24  26  25    Calcium 8.9 - 10.3 mg/dL 9.1  9.5  9.9   Total Protein 6.5 - 8.1 g/dL  7.2  7.8   Total Bilirubin 0.3 - 1.2 mg/dL  0.7  0.6   Alkaline Phos 38 - 126 U/L  63    AST 15 - 41 U/L  24  17   ALT 0 - 44 U/L  20  14     Imaging: DG HIP UNILAT WITH PELVIS 1V RIGHT  Result Date: 03/07/2023 CLINICAL DATA:  82 year old female status post right femoral neck fracture. EXAM: DG HIP (WITH OR WITHOUT PELVIS) 1V RIGHT COMPARISON:  Right hip series yesterday. FINDINGS: Three intraoperative fluoroscopic AP spot views of the lower pelvis and right hip at 1113 hours. There is now right total hip arthroplasty hardware in place. Normal hardware AP alignment on the final 2 images. No adverse features are evident. Fluoroscopy time: 0 minutes 22 seconds.  1.9 mGy. IMPRESSION: Intraoperative fluoroscopy of right total hip arthroplasty. No adverse features. Electronically Signed   By: Genevie Ann M.D.   On: 03/07/2023 11:33   DG C-Arm 1-60 Min-No Report  Result Date: 03/07/2023 Fluoroscopy was utilized by the requesting physician.  No radiographic interpretation.   CT Head Wo Contrast  Result Date: 03/06/2023 CLINICAL DATA:  Fall at home today. Blunt head trauma. On anticoagulation. EXAM: CT HEAD WITHOUT CONTRAST TECHNIQUE: Contiguous axial images were obtained from the base of the skull through the  vertex without intravenous contrast. RADIATION DOSE REDUCTION: This exam was performed according to the departmental dose-optimization program which includes automated exposure control, adjustment of the mA and/or kV according to patient size and/or use of iterative reconstruction technique. COMPARISON:  05/25/2017 FINDINGS: Brain: No evidence of intracranial hemorrhage, acute infarction, hydrocephalus, extra-axial collection, or mass lesion/mass effect. Mild chronic small vessel disease shows no significant change. Vascular:  No hyperdense vessel or other acute findings. Skull: No evidence of fracture or other significant bone abnormality. Sinuses/Orbits:  No acute findings. Other: None. IMPRESSION: No acute intracranial abnormality. Mild chronic small vessel disease. Electronically Signed   By: Marlaine Hind M.D.   On: 03/06/2023 12:18    Assessment/Plan:   Principal Problem:   Closed displaced fracture of right femoral neck (HCC) Active Problems:   Syncope   Hypokalemia   Hypotension   Diabetes mellitus type 2 in nonobese (HCC)   CAD (coronary atherosclerotic disease)   Patient Summary: NYASHA Whitehead is a 82 y.o. 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  Patient admitted for further evaluation and management of acute right proximal femoral neck fracture and workup of syncope.   #Acute right proximal femoral neck fracture #Osteopenia #Leukocytosis, likely reactive #Vitamin D deficiency Patient evaluated bedside today.  Patient states pain is better.  She states that she is doing better.  On exam, patient still has limited movement of right lower extremity secondary to pain.  Patient did have increase in white blood cell count today.  I believe this is likely reactive.  Vitamin D resulted low.  Plan for surgery today per orthopedics.  Will help with pain  control.  Will be gentle with pain control after episode of bradycardia and hypotension yesterday. -Start vitamin D supplementation -Multimodal pain control with Tylenol and oxycodone -PT OT to evaluate after surgery -Orthopedics to take patient for surgery today  #Syncope  Patient has not had any more syncopal episodes.  Yesterday's episode likely vasovagal.  Previous episodes seem to be vasovagal in nature as well.  Would like to rule out any valvular etiologies with echocardiogram.  EKG did show incomplete right bundle branch block which was seen on previous EKG as well.  Continue telemetry.   -Echo pending -Monitor on telemetry -Will obtain orthostatics when patient starts working with PT/OT   #  Hypokalemia, resolved Potassium back to normal today.   #Hypertension Blood pressure came back to normal yesterday after fluid bolus.  Hypotension likely secondary to morphine.  Will currently hold blood pressure medications.  Will continue to add in as patient's blood pressure can tolerate. -Hold home metoprolol 50 mg daily -Hold home lisinopril 20 mg daily -Continue to monitor blood pressure -Add in blood pressure meds gently   #History of CAD #Status post drug-eluting stent x 2 in 2015 #Hyperlipidemia No acute concerns for ACS.  Continue home rosuvastatin 40 mg daily.  Given surgery, hold aspirin. -Hold aspirin -Continue home rosuvastatin 40 mg daily  #Type 2 diabetes mellitus Blood sugars are measuring well during hospitalization.  A1c pending. -Follow-up A1c   Diet: NPO, can resume diet after procedure IVF: None VTE: SCDs Code: Full PT/OT recs: Pending  Dispo: Anticipated discharge to Skilled nursing facility vs Home in 4 days pending Clinical improvement and PT evaluation.   [facility] Resident PGY-1 [phone] Please contact the on call pager after 5 pm and on weekends at [phone].

## 2023-03-07 NOTE — Interval H&P Note (Signed)
History and Physical Interval Note:  03/07/2023 8:44 AM  [name]  has presented today for surgery, with the diagnosis of RIght Hip Fx.  The various methods of treatment have been discussed with the patient and family. After consideration of risks, benefits and other options for treatment, the patient has consented to  Procedure(s): TOTAL HIP ARTHROPLASTY ANTERIOR APPROACH (Right) as a surgical intervention.  The patient's history has been reviewed, patient examined, no change in status, stable for surgery.  I have reviewed the patient's chart and labs.  Questions were answered to the patient's satisfaction.     [name]

## 2023-03-07 NOTE — Progress Notes (Signed)
PT Cancellation Note  Patient Details Name: [name] MRN: [id number] DOB: 1941/05/06   Cancelled Treatment:    Reason Eval/Treat Not Completed: Other (comment) Plan for R THA today.  Wyona Almas, PT, DPT Acute Rehabilitation Services Office [phone]    [name] 03/07/2023, 7:55 AM

## 2023-03-07 NOTE — Progress Notes (Signed)
OT Cancellation Note  Patient Details Name: [name] MRN: [id number] DOB: [birth year]   Cancelled Treatment:    Reason Eval/Treat Not Completed: Patient not medically ready Patient has plan for R THA on 03/08/23. OT will follow back once medically appropriate.  Corinne Ports E. Aubreana Cornacchia, OTR/L Acute Rehabilitation Services [phone]   [name] 03/07/2023, 7:56 AM

## 2023-03-07 NOTE — Progress Notes (Addendum)
Subjective: Paged by nursing staff for sustained SVT. Our team went to reassess the patient. Upon arrival, patient was resting comfortably in bed. Denied chest pain, palpitations, or SOB. Patient reports history of elevated heart rate in the past when she was constipated. She feels as though she is constipated now.    Objective: Constitutional: Well-developed, well-nourished, appears comfortable  Cardiovascular: Tachyardic, supraventricular tachycardia. No murmurs, rubs, or gallops. Normal radial and PT pulses bilaterally. No LE edema.  Pulmonary: Normal respiratory effort. No wheezes, rales, or rhonchi.     Abdominal: Soft. Normal bowel sounds.  Neurological: Alert and oriented to person, place, and time. Non-focal. Skin: warm and dry.    Plan: Patient has remained in SVT for 10+ minutes.  Initially HR was in the 180s but increased to the 200s. Otherwise HDS w/ MAP in the 80s. Satting well on 0.5 L O2. EKG confirmed wide-complex SVT. Administered 6 mg adenosine followed by saline flush. Returned to normal sinus rhythm with HR in the 90s. MAP remained in the 80s. Remains hemodynamically stable. Patient tolerated adenosine well and remains asymptomatic. Will order home metoprolol and PRN metoprolol for SVT w/ HR > 140. Will add senna to her bowel regimen. Potassium was at ULN this morning. Will order repeat BMP and Mg level to assess for electrolyte disturbance as a cause of her arrhythmia.   -Home metoprolol 50 mg daily -IV lopressor 5 mg every 5 minutes PRN for SVT w/ HR > 140 (max 15 mg) -Senna BID -Pending BMP/Mg level -Consider cardiology consult if recurrent SVT

## 2023-03-07 NOTE — Anesthesia Postprocedure Evaluation (Signed)
Anesthesia Post Note  Patient: [name]  Procedure(s) Performed: TOTAL HIP ARTHROPLASTY ANTERIOR APPROACH (Right: Hip)     Patient location during evaluation: PACU Anesthesia Type: General Level of consciousness: sedated and patient cooperative Pain management: pain level controlled Vital Signs Assessment: post-procedure vital signs reviewed and stable Respiratory status: spontaneous breathing Cardiovascular status: stable Anesthetic complications: no   No notable events documented.  Last Vitals:  Vitals:   03/07/23 1230 03/07/23 1242  BP:  139/62  Pulse: 64 63  Resp:  16  Temp: 36.7 C 36.8 C  SpO2: 99% 100%    Last Pain:  Vitals:   03/07/23 1242  TempSrc: Oral  PainSc:                  Nolon Nations

## 2023-03-08 ENCOUNTER — Other Ambulatory Visit: Payer: Self-pay | Admitting: Cardiology

## 2023-03-08 ENCOUNTER — Inpatient Hospital Stay (HOSPITAL_COMMUNITY): Payer: Medicare HMO

## 2023-03-08 ENCOUNTER — Encounter (HOSPITAL_COMMUNITY): Payer: Self-pay | Admitting: Orthopedic Surgery

## 2023-03-08 DIAGNOSIS — S72001A Fracture of unspecified part of neck of right femur, initial encounter for closed fracture: Secondary | ICD-10-CM | POA: Diagnosis not present

## 2023-03-08 DIAGNOSIS — E559 Vitamin D deficiency, unspecified: Secondary | ICD-10-CM | POA: Diagnosis not present

## 2023-03-08 DIAGNOSIS — I471 Supraventricular tachycardia, unspecified: Secondary | ICD-10-CM

## 2023-03-08 DIAGNOSIS — R55 Syncope and collapse: Secondary | ICD-10-CM

## 2023-03-08 DIAGNOSIS — D72829 Elevated white blood cell count, unspecified: Secondary | ICD-10-CM | POA: Diagnosis not present

## 2023-03-08 DIAGNOSIS — M8588 Other specified disorders of bone density and structure, other site: Secondary | ICD-10-CM | POA: Diagnosis not present

## 2023-03-08 LAB — CBC
HCT: 30 % — ABNORMAL LOW (ref 36.0–46.0)
HCT: 29.4 % — ABNORMAL LOW (ref 36.0–46.0)
Hemoglobin: 9.6 g/dL — ABNORMAL LOW (ref 12.0–15.0)
Hemoglobin: 9.9 g/dL — ABNORMAL LOW (ref 12.0–15.0)
MCH: 24.9 pg — ABNORMAL LOW (ref 26.0–34.0)
MCH: 26 pg (ref 26.0–34.0)
MCHC: 32 g/dL (ref 30.0–36.0)
MCHC: 33.7 g/dL (ref 30.0–36.0)
MCV: 77.9 fL — ABNORMAL LOW (ref 80.0–100.0)
MCV: 77.2 fL — ABNORMAL LOW (ref 80.0–100.0)
Platelets: 92 10*3/uL — ABNORMAL LOW (ref 150–400)
Platelets: 101 10*3/uL — ABNORMAL LOW (ref 150–400)
RBC: 3.85 MIL/uL — ABNORMAL LOW (ref 3.87–5.11)
RBC: 3.81 MIL/uL — ABNORMAL LOW (ref 3.87–5.11)
RDW: 14.7 % (ref 11.5–15.5)
RDW: 15 % (ref 11.5–15.5)
WBC: 10 10*3/uL (ref 4.0–10.5)
WBC: 11.2 10*3/uL — ABNORMAL HIGH (ref 4.0–10.5)
nRBC: 0 % (ref 0.0–0.2)
nRBC: 0 % (ref 0.0–0.2)

## 2023-03-08 LAB — HEMOGLOBIN A1C
Hgb A1c MFr Bld: 5.7 % — ABNORMAL HIGH (ref 4.8–5.6)
Mean Plasma Glucose: 117 mg/dL

## 2023-03-08 LAB — ECHOCARDIOGRAM COMPLETE
AR max vel: 1.79 cm2
AV Area VTI: 2.24 cm2
AV Area mean vel: 2.21 cm2
AV Mean grad: 4 mmHg
AV Peak grad: 8.9 mmHg
Ao pk vel: 1.49 m/s
Area-P 1/2: 2.9 cm2
Calc EF: 68.9 %
Height: 64 in
S' Lateral: 2.3 cm
Single Plane A2C EF: 68.9 %
Single Plane A4C EF: 74.3 %
Weight: 2496 oz

## 2023-03-08 LAB — FERRITIN: Ferritin: 133 ng/mL (ref 11–307)

## 2023-03-08 LAB — BASIC METABOLIC PANEL
Anion gap: 7 (ref 5–15)
BUN: 11 mg/dL (ref 8–23)
CO2: 23 mmol/L (ref 22–32)
Calcium: 8.5 mg/dL — ABNORMAL LOW (ref 8.9–10.3)
Chloride: 108 mmol/L (ref 98–111)
Creatinine, Ser: 0.89 mg/dL (ref 0.44–1.00)
GFR, Estimated: >60 mL/min (ref >60)
Glucose, Bld: 130 mg/dL — ABNORMAL HIGH (ref 70–99)
Potassium: 4.3 mmol/L (ref 3.5–5.1)
Sodium: 138 mmol/L (ref 135–145)

## 2023-03-08 LAB — IRON AND TIBC
Iron: 13 ug/dL — ABNORMAL LOW (ref 28–170)
Saturation Ratios: 5 % — ABNORMAL LOW (ref 10.4–31.8)
TIBC: 288 ug/dL (ref 250–450)
UIBC: 275 ug/dL

## 2023-03-08 MED ORDER — POLYSACCHARIDE IRON COMPLEX 150 MG PO CAPS
150.0000 mg | ORAL_CAPSULE | ORAL | Status: DC
  Administered 2023-03-08: 150 mg via ORAL
  Filled 2023-03-08 (×3): qty 1

## 2023-03-08 MED ORDER — POLYETHYLENE GLYCOL 3350 17 G PO PACK
17.0000 g | PACK | Freq: Every day | ORAL | Status: DC
  Administered 2023-03-09: 17 g via ORAL
  Filled 2023-03-08: qty 1

## 2023-03-08 MED ORDER — IBUPROFEN 200 MG PO TABS
400.0000 mg | ORAL_TABLET | Freq: Four times a day (QID) | ORAL | Status: DC | PRN
  Administered 2023-03-08: 400 mg via ORAL
  Filled 2023-03-08: qty 2

## 2023-03-08 NOTE — Progress Notes (Addendum)
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  Imaging consistent with acute right proximal femoral neck fracture, and patient admitted for further evaluation and management.  Overnight Events: Team paged as patient had very fast heart rate.  Telemetry showed rates into the 200s.  EKG obtained overnight, patient was found to be in SVT.  Patient was given adenosine, and patient returned back to normal sinus rhythm.  See note for full details.  Patient evaluated bedside this morning.  She states she is having significant amount of pain in her right hip.  She states she has not moved her hip is much.  She states that otherwise she is doing fine.  She states during her episode of SVT, she had no symptoms.  She denies any chest pain, shortness of breath, lightheadedness, dizziness, or palpitations.  This morning she denies the same.  She states that usually happens when she is constipated, and states she is constipated this morning.  Objective:  Vital signs in last 24 hours: Vitals:   03/07/23 2131 03/07/23 2231 03/07/23 2331 03/08/23 0431  BP: 133/64 123/61 130/64 (!) 111/58  Pulse: 83 84 64 60  Resp: 17 16 16  (!) 21  Temp: 98.7 F (37.1 C)  98.8 F (37.1 C)   TempSrc: Oral  Oral   SpO2: 100% 99% 99% 96%  Weight:      Height:       Supplemental O2: Room Air SpO2: 99 %   Physical Exam:  Constitutional: resting in bed no acute distress HENT: normocephalic atraumatic Cardiovascular: regular rate and rhythm, no m/r/g Pulmonary/Chest: normal work of breathing on room air, lungs clear to auscultation bilaterally Ext: 2+ pedal pulses bilaterally.  Great sensation.  Decreased range of motion of right lower extremity secondary to pain.  Filed Weights   03/06/23 1141  Weight: 70.8 kg      Intake/Output Summary (Last 24 hours) at 03/08/2023 [id number] Last data filed at 03/07/2023 2215 Gross per 24 hour  Intake 800 ml  Output 1200 ml  Net -400 ml    Net IO Since Admission: -124.92 mL [03/08/23 0621]  Pertinent Labs:    Latest Ref Rng & Units 03/08/2023    3:42 AM 03/07/2023    2:16 AM 03/06/2023   11:22 AM  CBC  WBC 4.0 - 10.5 K/uL 10.0  11.0  5.6   Hemoglobin 12.0 - 15.0 g/dL 9.6  11.2  13.0   Hematocrit 36.0 - 46.0 % 30.0  34.5  39.0   Platelets 150 - 400 K/uL 92  121  141        Latest Ref Rng & Units 03/08/2023    3:42 AM 03/07/2023   10:41 PM 03/07/2023    2:16 AM  CMP  Glucose 70 - 99 mg/dL 130  178  123   BUN 8 - 23 mg/dL 11  11  16    Creatinine 0.44 - 1.00 mg/dL 0.89  1.05  0.92   Sodium 135 - 145 mmol/L 138  136  136   Potassium 3.5 - 5.1 mmol/L 4.3  4.5  5.1   Chloride 98 - 111 mmol/L 108  104  104   CO2 22 - 32 mmol/L 23  23  24    Calcium 8.9 - 10.3 mg/dL 8.5  9.0  9.1  Imaging: DG HIP UNILAT WITH PELVIS 1V RIGHT  Result Date: 03/07/2023 CLINICAL DATA:  82 year old female status post right femoral neck fracture. EXAM: DG HIP (WITH OR WITHOUT PELVIS) 1V RIGHT COMPARISON:  Right hip series yesterday. FINDINGS: Three intraoperative fluoroscopic AP spot views of the lower pelvis and right hip at 1113 hours. There is now right total hip arthroplasty hardware in place. Normal hardware AP alignment on the final 2 images. No adverse features are evident. Fluoroscopy time: 0 minutes 22 seconds.  1.9 mGy. IMPRESSION: Intraoperative fluoroscopy of right total hip arthroplasty. No adverse features. Electronically Signed   By: Genevie Ann M.D.   On: 03/07/2023 11:33   DG C-Arm 1-60 Min-No Report  Result Date: 03/07/2023 Fluoroscopy was utilized by the requesting physician.  No radiographic interpretation.    Assessment/Plan:   Principal Problem:   Closed displaced fracture of right femoral neck (HCC) Active Problems:   Syncope   Hypokalemia   Hypotension    Diabetes mellitus type 2 in nonobese (HCC)   CAD (coronary atherosclerotic disease)   Patient Summary: [name] is a 82 y.o. 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  Patient admitted for further evaluation and management of acute right proximal femoral neck fracture and workup of syncope.   #Acute right proximal femoral neck fracture #Osteopenia #Leukocytosis, resolving #Vitamin D deficiency Patient is post op day 1. Patient evaluated at bedside. She is reporting a good amount of pain. She notes that it is hard for her to move her extremities. On exam she has very limited movement secondary to pain of her right lower extremity. She does have 2+ palpable pulse. White count is slowing trending down. Her vitamin d was low. Would not cause this osteoporosis as this was not a fall from standing height. Will plan to evaluated with PT/OT today. Will try and control pain today so that she can work with PT. Will be cautious with this as she did have strong reaction to morphine. Will give some NSAIDs. -Continue vitamin D supplementation -Multimodal pain control with Tylenol, ibuprofen, oxycodone, and Robaxin -PT/OT to evaluate -Ortho following, appreciate recs -Bowel regimen  #Syncope  #SVT Overnight, patient had episode of SVT.  Patient denies any syncopal episode.  EKG obtained overnight showed narrow complex QRS with rates into the 200s.  Consistent with SVT.  Patient was given adenosine.  On exam this morning, patient has normal sinus rhythm.  Telemetry reviewed consistent with SVT at 8 PM.  Echo obtained today, with no acute valvular abnormalities.  Patient to get Live monitor on discharge to evaluate for any arrhythmias as syncope could be related to arrhythmias.   -Monitor on telemetry -Obtain orthostatics today -live monitor at discharge    #Microcytic anemia Hemoglobin 9.6 today.  MCV 77.9.  Likely secondary to acute blood loss anemia.  Will continue to monitor.  Iron studies showing potential iron deficiency with decreased Iron saturations and iron. -Monitor CBC -Start iron supplementation  #Hypertension Blood pressure measuring well currently.  Patient currently on metoprolol succinate 50 mg daily.  Currently holding lisinopril 20 mg daily. -Continue home metoprolol 50 mg daily -Hold home lisinopril 20 mg daily -Continue Crestor 40 mg  -Continue monitor blood pressure   #History of CAD #Status post drug-eluting stent x 2 in 2015 #Hyperlipidemia No acute concerns for ACS.  Continue home rosuvastatin 40 mg daily.  Will resume aspirin tomorrow if patient remains hemodynamically stable and hemoglobin remains stable. -Hold aspirin -Continue home rosuvastatin 40 mg daily  #Type 2 diabetes mellitus, well-controlled A1c 5.7.  Diet: Regular Diet  IVF: None VTE: Enoxaparin Code: Full PT/OT recs: Pending  Dispo: Anticipated discharge to Skilled nursing facility vs Home in 3 days pending Clinical improvement and PT evaluation.   [facility] Resident PGY-1 [phone] Please contact the on call pager after 5 pm and on weekends at [phone].

## 2023-03-08 NOTE — Progress Notes (Signed)
   03/07/23 1951  Assess: MEWS Score  BP 115/75  MAP (mmHg) 87  Pulse Rate (!) 180  ECG Heart Rate (!) 180  Resp (!) 26  Level of Consciousness Alert  SpO2 99 %  Assess: MEWS Score  MEWS Temp 0  MEWS Systolic 0  MEWS Pulse 3  MEWS RR 2  MEWS LOC 0  MEWS Score 5  MEWS Score Color Red  Assess: if the MEWS score is Yellow or Red  Were vital signs taken at a resting state? Yes  Focused Assessment No change from prior assessment  Does the patient meet 2 or more of the SIRS criteria? Yes  Does the patient have a confirmed or suspected source of infection? No  Provider and Rapid Response Notified? Yes  MEWS guidelines implemented  Yes, red  Treat  MEWS Interventions Considered administering scheduled or prn medications/treatments as ordered  Take Vital Signs  Increase Vital Sign Frequency  Red: Q1hr x2, continue Q4hrs until patient remains green for 12hrs  Escalate  MEWS: Escalate Red: Discuss with charge nurse and notify provider. Consider notifying RRT. If remains red for 2 hours consider need for higher level of care  Notify: Charge Nurse/RN  Name of Charge Nurse/RN Notified [facility]  Provider Notification  Provider Name/Title Dr. Alton Revere  Date Provider Notified 03/07/23  Time Provider Notified 1955  Method of Notification Page  Notification Reason New onset of dysrhythmia  Provider response En route  Date of Provider Response 03/07/23  Time of Provider Response 1956  Notify: Rapid Response  Name of Rapid Response RN Notified Mindy RN  Date Rapid Response Notified 03/07/23  Time Rapid Response Notified 1955  Assess: SIRS CRITERIA  SIRS Temperature  0  SIRS Pulse 1  SIRS Respirations  1  SIRS WBC 0  SIRS Score Sum  2   Tele called at 1947 to report pt in sustained SVT. RN assessed pt, pt denied chest pain/sob. Charge, rapid, and primary care team called. EKG orders placed and EKG obtained. Pt returned to NSR after receiving IV Adenosine given by rapid response. New orders  received to restart pt's home Metoprolol.

## 2023-03-08 NOTE — Evaluation (Signed)
Physical Therapy Evaluation Patient Details Name: [name] MRN: [id number] DOB: 1941/07/19 Today's Date: 03/08/2023  History of Present Illness  Christopher Giang is a 82 year old female who presents to the emergency room after a fall with loss of consciousness, resulting in hip fx, now s/p THA, direct anterior approach, WBAT; Medical Team also investigating syncope; living with type 2 diabetes, hypertension, CAD, STEMI 2015 status post PCI, gout  Clinical Impression   Pt admitted with above diagnosis. Lives at home with daughter, in a two-level home (bed and bathroom upstairs) with 3 steps to enter; Powder room on main floor; Prior to admission, pt was independent; Presents to PT with RLE pain limiting activity tolerance, decr strength, functional dependencies;  Needs mod assist for bed mobility and functional transfers; Able to walk short distance in room with chair follow; Hopeful for good progress; Serial BPs for orthostatics with an interesting response to increasing activity on Eval today -- I wonder how much anxiety with anticipation of pain factored into BPs; Will plan to get another set of Orthostatic vitals next session (when there is less anxiety related to getting up and OOB being unknown); Pt currently with functional limitations due to the deficits listed below (see PT Problem List). Pt will benefit from skilled PT to increase their independence and safety with mobility to allow discharge to the venue listed below.          Recommendations for follow up therapy are one component of a multi-disciplinary discharge planning process, led by the attending physician.  Recommendations may be updated based on patient status, additional functional criteria and insurance authorization.  Follow Up Recommendations       Assistance Recommended at Discharge Intermittent Supervision/Assistance  Patient can return home with the following  A lot of help with walking and/or transfers;Assist for  transportation;Help with stairs or ramp for entrance    Equipment Recommendations Rolling walker (2 wheels);BSC/3in1;Other (comment) (consider hospital bed for first floor of home)  Recommendations for Other Services  OT consult (as ordered)    Functional Status Assessment Patient has had a recent decline in their functional status and demonstrates the ability to make significant improvements in function in a reasonable and predictable amount of time.     Precautions / Restrictions Precautions Precautions: Fall Restrictions Weight Bearing Restrictions: No RLE Weight Bearing: Weight bearing as tolerated      Mobility  Bed Mobility Overal bed mobility: Needs Assistance Bed Mobility: Supine to Sit     Supine to sit: Mod assist     General bed mobility comments: mod A with R LE, min A to elevate trunk and mod Ausing pad to scoot hips forward once seated    Transfers Overall transfer level: Needs assistance Equipment used: Rolling walker (2 wheels) Transfers: Sit to/from Stand, Bed to chair/wheelchair/BSC Sit to Stand: Mod assist   Step pivot transfers: Mod assist       General transfer comment: mod A to guide RW, pt with R knee flexed    Ambulation/Gait Ambulation/Gait assistance: Mod assist (second person for chair push) Gait Distance (Feet): 6 Feet Assistive device: Rolling walker (2 wheels) Gait Pattern/deviations: Step-to pattern, Decreased stance time - right, Decreased step length - left, Antalgic       General Gait Details: Step-by-step cues for sequence, using RW to [facility] Painful RLE,  and multimodal cues to invite pt into stance on painful R LE to allow for LLE stepping  Stairs  Wheelchair Mobility    Modified Rankin (Stroke Patients Only)       Balance                                             Pertinent Vitals/Pain Pain Assessment Pain Location: R hip/LE Pain Descriptors / Indicators: Aching, Sore,  Grimacing, Guarding    Home Living Family/patient expects to be discharged to:: Private residence Living Arrangements: Children Available Help at Discharge: Family Type of Home: House Home Access: Stairs to enter Entrance Stairs-Rails: Psychiatric nurse of Steps: 3   Home Layout: Two level Home Equipment: Cane - single point      Prior Function Prior Level of Function : Independent/Modified Independent             Mobility Comments: uses cane in community ADLs Comments: Ind with ADLs/selfcare     Hand Dominance   Dominant Hand: Right    Extremity/Trunk Assessment   Upper Extremity Assessment Upper Extremity Assessment: Defer to OT evaluation    Lower Extremity Assessment Lower Extremity Assessment: RLE deficits/detail RLE Deficits / Details: Grossly decr AROM and strength, limited by pain postop; able to activate quad, unable to acheive full knee extension; Painful, and difficulty getting heel to floor in standing       Communication   Communication: No difficulties  Cognition Arousal/Alertness: Awake/alert Behavior During Therapy: WFL for tasks assessed/performed Overall Cognitive Status: Within Functional Limits for tasks assessed                                 General Comments: Seems anxious with anticipation of pain        General Comments General comments (skin integrity, edema, etc.): See vitals flowsheets for serial BPs    Exercises     Assessment/Plan    PT Assessment Patient needs continued PT services  PT Problem List Decreased strength;Decreased range of motion;Decreased activity tolerance;Decreased balance;Decreased mobility;Decreased coordination;Decreased knowledge of use of DME;Decreased safety awareness;Decreased knowledge of precautions;Pain       PT Treatment Interventions DME instruction;Gait training;Stair training;Functional mobility training;Therapeutic activities;Therapeutic exercise;Balance  training;Patient/family education    PT Goals (Current goals can be found in the Care Plan section)  Acute Rehab PT Goals Patient Stated Goal: Wants to move well enough to return home PT Goal Formulation: With patient Time For Goal Achievement: 03/22/23 Potential to Achieve Goals: Good    Frequency 7X/week     Co-evaluation PT/OT/SLP Co-Evaluation/Treatment: Yes Reason for Co-Treatment: For patient/therapist safety;To address functional/ADL transfers PT goals addressed during session: Mobility/safety with mobility OT goals addressed during session: Proper use of Adaptive equipment and DME;ADL's and self-care       AM-PAC PT "6 Clicks" Mobility  Outcome Measure Help needed turning from your back to your side while in a flat bed without using bedrails?: A Lot Help needed moving from lying on your back to sitting on the side of a flat bed without using bedrails?: A Little Help needed moving to and from a bed to a chair (including a wheelchair)?: A Lot Help needed standing up from a chair using your arms (e.g., wheelchair or bedside chair)?: A Lot Help needed to walk in hospital room?: A Lot Help needed climbing 3-5 steps with a railing? : A Lot 6 Click Score: 13    End of Session  Equipment Utilized During Treatment: Gait belt Activity Tolerance: Patient tolerated treatment well Patient left: in chair;with call bell/phone within reach;with chair alarm set;with family/visitor present Nurse Communication: Mobility status PT Visit Diagnosis: Unsteadiness on feet (R26.81);Pain Pain - Right/Left: Right Pain - part of body: Leg    Time: [id number] PT Time Calculation (min) (ACUTE ONLY): 49 min   Charges:   PT Evaluation $PT Eval Moderate Complexity: [place], [place] Office [phone]   Colletta [place] 03/08/2023, 2:34 PM

## 2023-03-08 NOTE — Progress Notes (Signed)
   Live monitor requested by teaching team due to syncope, SVT. Results to Dr. Harriet Masson.  Lily Kocher, PA-C

## 2023-03-08 NOTE — Progress Notes (Signed)
    Subjective: Patient reports pain as moderate. Reports right knee hurts more than hip. Tolerating diet.  Urinating.   No CP, SOB.  Hasn't mobilized OOB with PT yet.   Objective:   VITALS:   Vitals:   03/07/23 2231 03/07/23 2331 03/08/23 0431 03/08/23 0811  BP: 123/61 130/64 (!) 111/58 (!) 129/58  Pulse: 84 64 60 64  Resp: 16 16 (!) 21 17  Temp:  98.8 F (37.1 C)  98.2 F (36.8 C)  TempSrc:  Oral  Oral  SpO2: 99% 99% 96% 93%  Weight:      Height:          Latest Ref Rng & Units 03/08/2023    3:42 AM 03/07/2023    2:16 AM 03/06/2023   11:22 AM  CBC  WBC 4.0 - 10.5 K/uL 10.0  11.0  5.6   Hemoglobin 12.0 - 15.0 g/dL 9.6  11.2  13.0   Hematocrit 36.0 - 46.0 % 30.0  34.5  39.0   Platelets 150 - 400 K/uL 92  121  141       Latest Ref Rng & Units 03/08/2023    3:42 AM 03/07/2023   10:41 PM 03/07/2023    2:16 AM  BMP  Glucose 70 - 99 mg/dL 130  178  123   BUN 8 - 23 mg/dL 11  11  16    Creatinine 0.44 - 1.00 mg/dL 0.89  1.05  0.92   Sodium 135 - 145 mmol/L 138  136  136   Potassium 3.5 - 5.1 mmol/L 4.3  4.5  5.1   Chloride 98 - 111 mmol/L 108  104  104   CO2 22 - 32 mmol/L 23  23  24    Calcium 8.9 - 10.3 mg/dL 8.5  9.0  9.1    Intake/Output      03/24 0701 03/25 0700 03/25 0701 03/26 0700   P.O.     I.V. (mL/kg) 700 (9.9)    IV Piggyback 100    Total Intake(mL/kg) 800 (11.3)    Urine (mL/kg/hr) 1100 (0.6)    Blood 100    Total Output 1200    Net -400         Urine Occurrence 2 x       Physical Exam: General: NAD.  Laying in bed, calm, sleepy Resp: No increased wob Cardio: regular rate and rhythm ABD soft Neurologically intact MSK Neurovascularly intact Sensation intact distally Intact pulses distally Dorsiflexion/Plantar flexion intact Incision: dressing C/D/I Distal thigh TTP, knee ROM decreased   Assessment: 1 Day Post-Op  S/P Procedure(s) (LRB): TOTAL HIP ARTHROPLASTY ANTERIOR APPROACH (Right) by Dr. Ernesta Amble. Percell Miller on 03/07/23  Principal  Problem:   Closed displaced fracture of right femoral neck (HCC) Active Problems:   Syncope   Hypokalemia   Hypotension   Diabetes mellitus type 2 in nonobese (HCC)   CAD (coronary atherosclerotic disease)   Plan:  Advance diet Up with therapy Incentive Spirometry Elevate and Apply ice  Weightbearing: WBAT RLE Insicional and dressing care: Dressings left intact until follow-up and Reinforce dressings as needed Orthopedic device(s): None Showering: Keep dressing dry VTE prophylaxis: Lovenox 40mg  qd , SCDs, ambulation Pain control: continue current regimen Follow - up plan: 2 weeks post-op Contact information:  Edmonia Lynch MD, Aggie Moats PA-C  Dispo:  TBD based on PT evaluation.       Britt Bottom, PA-C Office [phone] 03/08/2023, 10:54 AM

## 2023-03-08 NOTE — TOC CAGE-AID Note (Signed)
Transition of Care [facility]) - CAGE-AID Screening  Patient Details  Name: [name] MRN: [id number] Date of Birth: [birth year]  Clinical Narrative:  Patient to ED after a mechanical fall. Patient denies any alcohol or drug use, no need for substance abuse resources at this time.  CAGE-AID Screening:   Have You Ever Felt You Ought to Cut Down on Your Drinking or Drug Use?: No Have People Annoyed You By Critizing Your Drinking Or Drug Use?: No Have You Felt Bad Or Guilty About Your Drinking Or Drug Use?: No Have You Ever Had a Drink or Used Drugs First Thing In The Morning to Steady Your Nerves or to Get Rid of a Hangover?: No CAGE-AID Score: 0  Substance Abuse Education Offered: No

## 2023-03-08 NOTE — Evaluation (Signed)
Occupational Therapy Evaluation Patient Details Name: [name] MRN: [id number] DOB: 1941/08/17 Today's Date: 03/08/2023   History of Present Illness [name] is a 82 year old female who presents to the emergency room after a fall with loss of consciousness, resulting in hip fx, now s/p THA, direct anterior approach, WBAT; Medical Team also investigating syncope; living with type 2 diabetes, hypertension, CAD, STEMI 2015 status post PCI, gout   Clinical Impression   Pt presents with decline in function and safety with ADLs and ADL mobility with impaired strength, balance and endurance. PTA pt lived at home with her daughter and was Ind with ADLs/selfcare, IADLs, home mgt and was driving. Pt currently requires mod A to sit EOB, max A with LB ADLs and mod A with mobility/transfers using RW. Pt would benefit from acute OT services to address impairments to maximize level of function and safety      Recommendations for follow up therapy are one component of a multi-disciplinary discharge planning process, led by the attending physician.  Recommendations may be updated based on patient status, additional functional criteria and insurance authorization.   Assistance Recommended at Discharge Frequent or constant Supervision/Assistance  Patient can return home with the following A lot of help with bathing/dressing/bathroom;A lot of help with walking and/or transfers;Assistance with cooking/housework;Assist for transportation;Help with stairs or ramp for entrance    Functional Status Assessment  Patient has had a recent decline in their functional status and demonstrates the ability to make significant improvements in function in a reasonable and predictable amount of time.  Equipment Recommendations  BSC/3in1;Other (comment) (RW, reacher, LH bath spinge)    Recommendations for Other Services       Precautions / Restrictions Precautions Precautions: Fall Restrictions Weight Bearing  Restrictions: No RLE Weight Bearing: Weight bearing as tolerated      Mobility Bed Mobility Overal bed mobility: Needs Assistance Bed Mobility: Supine to Sit     Supine to sit: Mod assist     General bed mobility comments: mod A with R LE, min A to elevate trunk and mod Ausing pad to scoot hips forward once seated    Transfers Overall transfer level: Needs assistance Equipment used: Rolling walker (2 wheels) Transfers: Sit to/from Stand, Bed to chair/wheelchair/BSC Sit to Stand: Mod assist     Step pivot transfers: Mod assist     General transfer comment: mod A to guide RW, pt with R knee flexed      Balance Overall balance assessment: Needs assistance Sitting-balance support: Bilateral upper extremity supported, Feet unsupported Sitting balance-Leahy Scale: Good     Standing balance support: Bilateral upper extremity supported, During functional activity Standing balance-Leahy Scale: Poor                             ADL either performed or assessed with clinical judgement   ADL Overall ADL's : Needs assistance/impaired Eating/Feeding: Independent   Grooming: Wash/dry hands;Wash/dry face;Set up;Supervision/safety;Sitting   Upper Body Bathing: Set up;Supervision/ safety;Sitting   Lower Body Bathing: Maximal assistance   Upper Body Dressing : Set up;Supervision/safety;Sitting   Lower Body Dressing: Maximal assistance   Toilet Transfer: Moderate assistance;Cueing for safety;Cueing for sequencing;Stand-pivot;BSC/3in1;Rolling walker (2 wheels)   Toileting- Clothing Manipulation and Hygiene: Maximal assistance;Sit to/from stand Toileting - Clothing Manipulation Details (indicate cue type and reason): clothing mgt standing at RW     Functional mobility during ADLs: Moderate assistance;Cueing for safety;Cueing for sequencing;Rolling walker (2 wheels)  Vision Baseline Vision/History: 1 Wears glasses Ability to See in Adequate Light: 0  Adequate Patient Visual Report: No change from baseline       Perception     Praxis      Pertinent Vitals/Pain Pain Assessment Pain Assessment: 0-10 Pain Score: 5  Pain Location: R hip/LE Pain Descriptors / Indicators: Aching, Sore, Grimacing, Guarding Pain Intervention(s): Limited activity within patient's tolerance, Premedicated before session, Monitored during session, Repositioned     Hand Dominance Right   Extremity/Trunk Assessment Upper Extremity Assessment Upper Extremity Assessment: Generalized weakness   Lower Extremity Assessment Lower Extremity Assessment: Defer to PT evaluation       Communication Communication Communication: No difficulties   Cognition Arousal/Alertness: Awake/alert Behavior During Therapy: WFL for tasks assessed/performed Overall Cognitive Status: Within Functional Limits for tasks assessed                                       General Comments       Exercises     Shoulder Instructions      Home Living Family/patient expects to be discharged to:: Private residence Living Arrangements: Children Available Help at Discharge: Family Type of Home: House Home Access: Stairs to enter Technical brewer of Steps: 3 Entrance Stairs-Rails: Right;Left Home Layout: Two level;1/2 bath on main level;Bed/bath upstairs     Bathroom Shower/Tub: Occupational psychologist: Handicapped height     Home Equipment: Cane - single point          Prior Functioning/Environment Prior Level of Function : Independent/Modified Independent             Mobility Comments: uses cane in community ADLs Comments: Ind with ADLs/selfcare        OT Problem List: Decreased strength;Decreased activity tolerance;Impaired balance (sitting and/or standing);Decreased coordination;Decreased knowledge of precautions;Pain;Decreased knowledge of use of DME or AE      OT Treatment/Interventions: Self-care/ADL training;Balance  training;Therapeutic exercise;DME and/or AE instruction;Neuromuscular education;Therapeutic activities;Patient/family education    OT Goals(Current goals can be found in the care plan section) Acute Rehab OT Goals Patient Stated Goal: go home OT Goal Formulation: With patient/family Time For Goal Achievement: 03/22/23 Potential to Achieve Goals: Good ADL Goals Pt Will Perform Grooming: with set-up;sitting;with caregiver independent in assisting Pt Will Perform Upper Body Bathing: with set-up;sitting;with caregiver independent in assisting Pt Will Perform Lower Body Bathing: with mod assist;with adaptive equipment;with caregiver independent in assisting Pt Will Perform Upper Body Dressing: with set-up;sitting;with caregiver independent in assisting Pt Will Perform Lower Body Dressing: with mod assist;with adaptive equipment;with caregiver independent in assisting Pt Will Transfer to Toilet: with mod assist;ambulating;regular height toilet;bedside commode;grab bars Pt Will Perform Toileting - Clothing Manipulation and hygiene: with min assist;with caregiver independent in assisting;sit to/from stand  OT Frequency: Min 2X/week    Co-evaluation PT/OT/SLP Co-Evaluation/Treatment: Yes Reason for Co-Treatment: For patient/therapist safety;To address functional/ADL transfers   OT goals addressed during session: Proper use of [employer] equipment and DME;ADL's and self-care      AM-PAC OT "6 Clicks" Daily Activity     Outcome Measure Help from another person eating meals?: None Help from another person taking care of personal grooming?: A Little Help from another person toileting, which includes using toliet, bedpan, or urinal?: A Lot Help from another person bathing (including washing, rinsing, drying)?: A Lot Help from another person to put on and taking off regular upper body clothing?: A Little  Help from another person to put on and taking off regular lower body clothing?: A Lot 6 Click  Score: 16   End of Session Equipment Utilized During Treatment: Gait belt;Rolling walker (2 wheels);Other (comment) [facility]) Nurse Communication: Mobility status  Activity Tolerance: Patient tolerated treatment well Patient left: in chair;with call bell/phone within reach;with family/visitor present  OT Visit Diagnosis: Unsteadiness on feet (R26.81);Other abnormalities of gait and mobility (R26.89);Muscle weakness (generalized) (M62.81);Pain Pain - Right/Left: Right Pain - part of body: Hip;Leg                Time: [id number] OT Time Calculation (min): 59 min Charges:  OT General Charges $OT Visit: 1 Visit OT Evaluation $OT Eval Moderate Complexity: 1 Mod OT Treatments $Self Care/Home Management : 8-22 mins    Britt Bottom 03/08/2023, 2:15 PM

## 2023-03-09 ENCOUNTER — Other Ambulatory Visit (HOSPITAL_COMMUNITY): Payer: Self-pay

## 2023-03-09 ENCOUNTER — Inpatient Hospital Stay (HOSPITAL_BASED_OUTPATIENT_CLINIC_OR_DEPARTMENT_OTHER)
Admit: 2023-03-09 | Discharge: 2023-03-09 | Disposition: A | Discharge status: Home / Self Care | Payer: Medicare HMO | Attending: Cardiology | Admitting: Cardiology

## 2023-03-09 DIAGNOSIS — S72001A Fracture of unspecified part of neck of right femur, initial encounter for closed fracture: Secondary | ICD-10-CM | POA: Diagnosis not present

## 2023-03-09 DIAGNOSIS — I471 Supraventricular tachycardia, unspecified: Secondary | ICD-10-CM

## 2023-03-09 LAB — BASIC METABOLIC PANEL
Anion gap: 8 (ref 5–15)
BUN: 19 mg/dL (ref 8–23)
CO2: 25 mmol/L (ref 22–32)
Calcium: 8.9 mg/dL (ref 8.9–10.3)
Chloride: 102 mmol/L (ref 98–111)
Creatinine, Ser: 1.07 mg/dL — ABNORMAL HIGH (ref 0.44–1.00)
GFR, Estimated: 53 mL/min — ABNORMAL LOW (ref >60)
Glucose, Bld: 123 mg/dL — ABNORMAL HIGH (ref 70–99)
Potassium: 5.2 mmol/L — ABNORMAL HIGH (ref 3.5–5.1)
Sodium: 135 mmol/L (ref 135–145)

## 2023-03-09 LAB — CBC
HCT: 25.9 % — ABNORMAL LOW (ref 36.0–46.0)
Hemoglobin: 8.8 g/dL — ABNORMAL LOW (ref 12.0–15.0)
MCH: 26 pg (ref 26.0–34.0)
MCHC: 34 g/dL (ref 30.0–36.0)
MCV: 76.4 fL — ABNORMAL LOW (ref 80.0–100.0)
Platelets: 89 10*3/uL — ABNORMAL LOW (ref 150–400)
RBC: 3.39 MIL/uL — ABNORMAL LOW (ref 3.87–5.11)
RDW: 14.7 % (ref 11.5–15.5)
WBC: 9.2 10*3/uL (ref 4.0–10.5)
nRBC: 0 % (ref 0.0–0.2)

## 2023-03-09 MED ORDER — IBUPROFEN 400 MG PO TABS
400.0000 mg | ORAL_TABLET | Freq: Four times a day (QID) | ORAL | 0 refills | Status: AC | PRN
  Filled 2023-03-09: qty 20, 5d supply, fill #0

## 2023-03-09 MED ORDER — ACETAMINOPHEN 500 MG PO TABS
500.0000 mg | ORAL_TABLET | Freq: Three times a day (TID) | ORAL | 0 refills | Status: AC | PRN
  Filled 2023-03-09: qty 30, 10d supply, fill #0

## 2023-03-09 MED ORDER — ASPIRIN 81 MG PO TBEC
81.0000 mg | DELAYED_RELEASE_TABLET | Freq: Two times a day (BID) | ORAL | 0 refills | Status: AC
  Filled 2023-03-09: qty 60, 30d supply, fill #0

## 2023-03-09 MED ORDER — OXYCODONE HCL 5 MG PO TABS
5.0000 mg | ORAL_TABLET | Freq: Four times a day (QID) | ORAL | 0 refills | Status: AC | PRN
  Filled 2023-03-09: qty 12, 3d supply, fill #0

## 2023-03-09 MED ORDER — IRON POLYSACCH CMPLX-B12-FA 150-0.025-1 MG PO CAPS
ORAL_CAPSULE | ORAL | 0 refills | Status: AC
  Filled 2023-03-09: qty 12, 30d supply, fill #0

## 2023-03-09 MED ORDER — VITAMIN D (ERGOCALCIFEROL) 1.25 MG (50000 UNIT) PO CAPS
50000.0000 [IU] | ORAL_CAPSULE | ORAL | 0 refills | Status: AC
  Filled 2023-03-09: qty 4, 28d supply, fill #0

## 2023-03-09 MED ORDER — SENNOSIDES-DOCUSATE SODIUM 8.6-50 MG PO TABS
1.0000 | ORAL_TABLET | Freq: Two times a day (BID) | ORAL | 0 refills | Status: AC
  Filled 2023-03-09: qty 60, 30d supply, fill #0

## 2023-03-09 MED ORDER — SODIUM ZIRCONIUM CYCLOSILICATE 5 G PO PACK
5.0000 g | PACK | Freq: Once | ORAL | Status: AC
  Administered 2023-03-09: 5 g via ORAL
  Filled 2023-03-09 (×2): qty 1

## 2023-03-09 NOTE — Care Management Important Message (Signed)
Important Message  Patient Details  Name: [name] MRN: [id number] Date of Birth: 27-Aug-1941   Medicare Important Message Given:  Yes     Hannah Beat 03/09/2023, 11:51 AM

## 2023-03-09 NOTE — TOC Initial Note (Addendum)
Transition of Care [facility]) - Initial/Assessment Note    Patient Details  Name: [name] MRN: [id number] Date of Birth: [birth year]  Transition of Care [facility]) CM/SW Contact:    Carles Collet, RN Phone Number: 03/09/2023, 9:52 AM  Clinical Narrative:                 Damaris Schooner w patient and daughter at bedside.  Request for DME Hospital bed, WC, and 3/1. These will be delivered to the home, [employer] states they will be abel to get these delivered today, instructed to speak with daughter for set up. No preference for [facility] services, just highly rated.  Alvis Lemmings able to accept for [facility] PT OT.  Anticipate transport home by car  RW ordered to room for DC, daughter agreed to pay out of pocket, [employer] quoted $54.   DME will be delivered at the home at 4:30, daughter updated   Expected Discharge Plan: [place] Barriers to Discharge: Continued Medical Work up   Patient Goals and CMS Choice Patient states their goals for this hospitalization and ongoing recovery are:: to return home CMS Medicare.gov Compare Post Acute Care list provided to:: Other (Comment Required) Choice offered to / list presented to : Adult Children      Expected Discharge Plan and Services   Discharge Planning Services: CM Consult Post Acute Care Choice: Durable Medical Equipment, Home Health Living arrangements for the past 2 months: [place]                 DME Arranged: 3-N-1, Hospital bed, Lightweight manual wheelchair with seat cushion DME Agency: [employer] Date DME Agency Contacted: 03/09/23 Time DME Agency Contacted: [phone] Representative spoke with at DME Agency: Erasmo Downer HH Arranged: PT, OT [place] Agency: [place] Date [place]: 03/09/23 Time [place]: [phone] Representative spoke with at [place]: [place] Arrangements/Services Living arrangements for the past 2 months: [place] Lives with:: Adult Children   Do you feel safe  going back to the place where you live?: Yes               Activities of Daily Living Home Assistive Devices/Equipment: Cane (specify quad or straight) ADL Screening (condition at time of admission) Patient's cognitive ability adequate to safely complete daily activities?: Yes Is the patient deaf or have difficulty hearing?: No Does the patient have difficulty seeing, even when wearing glasses/contacts?: No Does the patient have difficulty concentrating, remembering, or making decisions?: No Patient able to express need for assistance with ADLs?: Yes Does the patient have difficulty dressing or bathing?: No Independently performs ADLs?: Yes (appropriate for developmental age) Does the patient have difficulty walking or climbing stairs?: Yes Weakness of Legs: Both Weakness of Arms/Hands: Right  Permission Sought/Granted                  Emotional Assessment              Admission diagnosis:  Closed displaced fracture of right femoral neck ([place]) [S72.001A] Patient Active Problem List   Diagnosis Date Noted   SVT (supraventricular tachycardia) 03/08/2023   Closed displaced fracture of right femoral neck ([place]) 03/06/2023   Syncope 03/06/2023   Hypokalemia 03/06/2023   Hypotension 03/06/2023   Diabetes mellitus type 2 in nonobese [facility]) 03/06/2023   CAD (coronary atherosclerotic disease) 03/06/2023   Right bundle branch block 08/02/2017   Constipation 08/13/2014   Coronary artery disease    Hyperlipidemia  Enthesopathy of ankle and tarsus 03/26/2014   History of acute inferior wall MI 03/25/2014   Hypertension 03/25/2014   TOE PAIN 10/27/2007   DENTAL CARIES 10/12/2007   OSTEOARTHRITIS, KNEES, 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

## 2023-03-09 NOTE — Discharge Summary (Signed)
Name: [name] MRN: [id number] DOB: 11/10/1941 82 y.o. PCP: Nolene Ebbs, MD  Date of Admission: 03/06/2023 11:12 AM Date of Discharge: 03/09/2023 Attending Physician: Dr. Lottie Mussel   Discharge Diagnosis: Principal Problem:   Closed displaced fracture of right femoral neck ([place]) Active Problems:   Syncope   Hypokalemia   Hypotension   Diabetes mellitus type 2 in nonobese [facility])   CAD (coronary atherosclerotic disease)   SVT (supraventricular tachycardia)    Discharge Medications: Allergies as of 03/09/2023   No Known Allergies      Medication List     STOP taking these medications    Aleve 220 MG tablet Generic drug: naproxen sodium   Baclofen 5 MG Tabs       TAKE these medications    acetaminophen 500 MG tablet Commonly known as: TYLENOL Take 1 tablet (500 mg total) by mouth every 8 (eight) hours as needed for moderate pain or mild pain.   allopurinol 100 MG tablet Commonly known as: ZYLOPRIM Take 100 mg by mouth daily.   aspirin EC 81 MG tablet Commonly known as: ASPIRIN LOW DOSE Take 1 tablet (81 mg total) by mouth 2 (two) times daily. Swallow whole. What changed:  when to take this additional instructions   Colcrys 0.6 MG tablet Generic drug: colchicine Take 0.6 mg by mouth See admin instructions. Take 0.6 mg by mouth daily as directed for gout flares   diclofenac Sodium 1 % Gel Commonly known as: VOLTAREN Apply 2 g topically 2 (two) times daily as needed (for pain- to affected sites).   hydrochlorothiazide 25 MG tablet Commonly known as: HYDRODIURIL Take 1 tablet (25 mg total) by mouth daily.   ibuprofen 400 MG tablet Commonly known as: ADVIL Take 1 tablet (400 mg total) by mouth every 6 (six) hours as needed for up to 5 days for mild pain.   Iron Polysacch Cmplx-B12-FA 150-0.025-1 MG Caps Take 1 capsule by mouth every Monday, Wednesday, and Friday. Start taking on: March 10, 2023   latanoprost 0.005 % ophthalmic  solution Commonly known as: XALATAN Place 1 drop into both eyes at bedtime.   lisinopril 20 MG tablet Commonly known as: ZESTRIL Take 1 tablet (20 mg total) by mouth daily.   metFORMIN 500 MG tablet Commonly known as: GLUCOPHAGE Take 500 mg by mouth daily.   metoprolol succinate 50 MG 24 hr tablet Commonly known as: TOPROL-XL TAKE 1 TABLET (50 MG TOTAL) BY MOUTH DAILY.   nitroGLYCERIN 0.4 MG SL tablet Commonly known as: Nitrostat Place 1 tablet (0.4 mg total) under the tongue every 5 (five) minutes as needed for chest pain.   oxyCODONE 5 MG immediate release tablet Commonly known as: Oxy IR/ROXICODONE Take 1 tablet (5 mg total) by mouth every 6 (six) hours as needed for up to 3 days for moderate pain or severe pain.   pantoprazole 40 MG tablet Commonly known as: PROTONIX Take 40 mg by mouth daily as needed. Acid reflux   PREVAGEN PO Take 1 capsule by mouth daily.   rosuvastatin 40 MG tablet Commonly known as: CRESTOR Take 1 tablet (40 mg total) by mouth daily.   senna-docusate 8.6-50 MG tablet Commonly known as: Senokot-S Take 1 tablet by mouth 2 (two) times daily.   True Metrix Air Glucose Meter w/Device Kit   True Metrix Blood Glucose Test test strip Generic drug: glucose blood   TRUEplus Lancets 33G Misc   Vitamin D (Ergocalciferol) 1.25 MG (50000 UNIT) Caps capsule Commonly known as: DRISDOL  Take 1 capsule (50,000 Units total) by mouth every 7 (seven) days. Start taking on: March 14, 2023               Durable Medical Equipment  (From admission, onward)           Start     Ordered   03/09/23 [id number]  For home use only DME lightweight manual wheelchair with seat cushion  Once       Comments: Patient suffers from hip fracture  which impairs their ability to perform daily activities like bathing in the home.  A cane will not resolve  issue with performing activities of daily living. A wheelchair will allow patient to safely perform daily activities.  Patient is not able to propel themselves in the home using a standard weight wheelchair due to general weakness. Patient can self propel in the lightweight wheelchair. Length of need Lifetime. Accessories: elevating leg rests (ELRs), wheel locks, extensions and anti-tippers.   03/09/23 0944   03/09/23 0943  For home use only DME Hospital bed  Once       Comments: Standard mattress  Question Answer Comment  Length of Need Lifetime   Patient has (list medical condition): hip fracture   The above medical condition requires: Patient requires the ability to reposition frequently   Head must be elevated greater than: 30 degrees   Bed type Semi-electric      03/09/23 0944   03/09/23 0941  For home use only DME Bedside commode  Once       Comments: Patient is confined to one room and is unable to get to bathroom  Question:  Patient needs a bedside commode to treat with the following condition  Answer:  Weakness   03/09/23 0944            Disposition and follow-up:   [name] was discharged from [facility] in Stable condition.  At the hospital follow up visit please address:  1.  Follow-up:  a.  Acute right proximal femoral neck fracture: Patient discharged on home health PT.  Patient discharged on pain regimen with Tylenol, oxycodone, and ibuprofen.  Patient to continue with home health PT.  Ortho follow-up in 2 weeks.    b.  Syncope/SVT: Patient had syncopal episode, and recurrent syncopal episodes prior to admission.  Workup and patient for the most part unrevealing.  Patient discharged on life monitor with cardiology follow-up.  Continue workup for syncope outpatient.   c.  Iron deficiency anemia: Patient discharged on iron supplementation.  Ensure patient has refills follow-up appointment.  Check CBC at follow-up.   d.  Vitamin D deficiency: Patient found to have decreased vitamin D during hospitalization.  Started supplementation.  Follow vitamin D levels.   Can benefit from bone density scan.  Can consider starting bisphosphonate once vitamin D is repleted.   2.  Labs / imaging needed at time of follow-up: CBC  3.  Pending labs/ test needing follow-up: N/A  4.  Medication Changes  Patient discharged on ibuprofen, oxycodone, and Tylenol.  Patient also discharged on iron supplementation as well as vitamin D supplementation.  Follow-up Appointments:  Follow-up Information     Renette Butters, MD. Schedule an appointment as soon as possible for a visit in 2 week(s).   Specialty: Orthopedic Surgery Contact information: [address] [place] [address] [phone]         [facility] Follow up.   Specialty:  Home Health Services Why: for home health services Contact information: [place] [address] [phone]         Nolene Ebbs, MD. Call today.   Specialty: Internal Medicine Why: To set up follow up appointment Contact information: [address] [place] [address] [place] Hospital Course by problem list: DYAN BELLUOMINI is a 82 y.o. 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  Patient admitted for further evaluation and management of acute right proximal femoral neck fracture and workup of syncope.   #Acute right proximal femoral neck fracture #Osteopenia #Vitamin D deficiency Patient presented to the emergency room after having a fall.  She also had a syncopal episode after the fall.  Imaging showed patient had acute right proximal femoral neck fracture.  During hospitalization patient had total right hip arthroplasty.  After surgery, patient worked with PT/OT who recommended home health.  Patient had good pain control during hospitalization.  Patient has close follow-up with  orthopedics.  Patient did have vitamin D level checked during hospitalization.  It was low, and supplementation was started.  Patient discharged on pain medicine as well as vitamin D supplementation.  For DVT prophylaxis, patient will be on aspirin 81 mg twice daily for the next 30 days per Ortho's request.  Patient can then transition back to 81 mg aspirin daily.   #Syncope  #SVT Patient presented after a fall, and a subsequent syncopal episode.  Patient did have evaluation for valvular disease with echo during hospitalization, which was unrevealing for any valvular etiology.  During hospitalization, patient did have episode of SVT with rates going into the 200s requiring adenosine.  Since patient did not have any episodes.  I wonder if this is playing a role in her syncopal episodes.  Did discharge patient on live monitor with close cardiology follow-up.  My suspicion is these are vasovagal episodes, but I would like to rule out any contributing arrhythmias.  Patient discharged on metoprolol succinate 50 mg daily   #Hypokalemia Patient initially had hypokalemia which was repleted.  Patient then had hyperkalemia, which patient was given Lokelma.  Mag was normal.  Patient had stable electrolytes on discharge.  #Hypertension Initially, patient was hypotensive.  This was likely secondary to hefty dose of morphine.  Held antihypertensives initially.  Resumed on discharge.    #History of CAD #Status post drug-eluting stent x 2 in 2015 #Hyperlipidemia No acute concerns during hospitalization.  On rosuvastatin 40 mg daily.  Patient to be discharged on aspirin 81 mg twice daily for the first 30 days, and then back to 81 mg daily.   #Microcytic anemia Patient had slight decrease in hemoglobin during hospitalization.  Likely secondary to acute blood loss.  Did check iron studies, in which patient had slightly decreased iron saturations.  Patient discharged on iron supplementation.  #Type 2 diabetes  mellitus A1c in hospital was 5.7.  Blood glucose measures were during hospitalization. Patient resumed metformin on discharge.    Discharge Subjective:  Patient states she is doing well.  Pain is well-controlled.  She has been up and moving with good movement.  She reports having a good bowel movement.  She states she is eating and drinking well.  She denies any lightheadedness, dizziness, or chest pain.  She  denies any shortness of breath.  She states she is ready go home.  Daughter updated at bedside.  Discharge Exam:   BP (!) 131/57 (BP Location: Left Arm)   Pulse 62   Temp 99.1 F (37.3 C) (Oral)   Resp 18   Ht 5\' 4"  (1.626 m)   Wt 70.8 kg   SpO2 96%   BMI 26.78 kg/m  Constitutional: Resting in bed, no acute distress HENT: normocephalic atraumatic Eyes: conjunctiva non-erythematous Neck: supple Cardiovascular: regular rate and rhythm, no m/r/g Pulmonary/Chest: normal work of breathing on room air, lungs clear to auscultation bilaterally Extremities: 2+ pedal pulses, right lower extremity with decreased range of motion secondary to pain.  Sensation intact bilaterally.  Wound with Band-Aid dressing on top with no signs of drainage.  Pertinent Labs, Studies, and Procedures:     Latest Ref Rng & Units 03/09/2023    2:03 AM 03/08/2023   11:48 AM 03/08/2023    3:42 AM  CBC  WBC 4.0 - 10.5 K/uL 9.2  11.2  10.0   Hemoglobin 12.0 - 15.0 g/dL 8.8  9.9  9.6   Hematocrit 36.0 - 46.0 % 25.9  29.4  30.0   Platelets 150 - 400 K/uL 89  101  92        Latest Ref Rng & Units 03/09/2023    2:03 AM 03/08/2023    3:42 AM 03/07/2023   10:41 PM  CMP  Glucose 70 - 99 mg/dL 123  130  178   BUN 8 - 23 mg/dL 19  11  11    Creatinine 0.44 - 1.00 mg/dL 1.07  0.89  1.05   Sodium 135 - 145 mmol/L 135  138  136   Potassium 3.5 - 5.1 mmol/L 5.2  4.3  4.5   Chloride 98 - 111 mmol/L 102  108  104   CO2 22 - 32 mmol/L 25  23  23    Calcium 8.9 - 10.3 mg/dL 8.9  8.5  9.0     No results found.    Discharge Instructions: Discharge Instructions     Call MD for:  extreme fatigue   Complete by: As directed    Call MD for:  persistant dizziness or light-headedness   Complete by: As directed    Call MD for:  redness, tenderness, or signs of infection (pain, swelling, redness, odor or green/yellow discharge around incision site)   Complete by: As directed    Call MD for:  temperature >100.4   Complete by: As directed    Diet - low sodium heart healthy   Complete by: As directed    Increase activity slowly   Complete by: As directed       Ms. Barry Brunner,  It was a pleasure taking care of you at [facility] were admitted for hip fracture and syncope.  You were treated with total hip replacement.  We are discharging you home now that you are doing better. Please follow the following instructions.   1) Regarding your hip replacement, please follow-up with orthopedics in 2 weeks.  Please continue taking your aspirin two times a day for the next 30 days and then switch back to 1 time a day after your 30 days.  We will send you home on some pain medicine for pain control.  We have sent for ibuprofen, Tylenol, and oxycodone.  Please continue to work with physical therapy.  2) Please follow-up with your primary care physician in about 1 week for  hospital follow-up.  3) Regarding your syncopal episodes, we are sending you home with a live monitor.  Please wear this and follow-up with the heart doctor who placed this monitor   4) Please take precaution with walking, and standing.  Please move slow and only do what you can tolerate.  5) We will be sending home on some vitamin D supplementation as well as iron supplements.  Take care,  Dr. Leigh Aurora, DO   Signed: Leigh Aurora, DO 03/09/2023, 2:55 PM   Pager: [phone]

## 2023-03-09 NOTE — Progress Notes (Signed)
Physical Therapy Treatment Patient Details Name: [name] MRN: [id number] DOB: May 22, 1941 Today's Date: 03/09/2023   History of Present Illness [name] is a 82 year old female who presents to the emergency room after a fall with loss of consciousness, resulting in hip fx, now s/p THA, direct anterior approach, WBAT; Medical Team also investigating syncope; living with type 2 diabetes, hypertension, CAD, STEMI 2015 status post PCI, gout    PT Comments    Patient progressing slowly towards PT goals. Session focused on ambulation progression, there ex and functional mobility. Requires Max A to stand from all surfaces due to weakness and unwillingness to place weight through RLE despite cues. Fatigues quickly needing frequent rest breaks with ambulation. Difficulty advancing RLE and placing right foot on floor due to pain. Reviewed there ex of RLE. Daughter plans to take pt home at this level- given gait belt and appropriate DME. Will follow.    Recommendations for follow up therapy are one component of a multi-disciplinary discharge planning process, led by the attending physician.  Recommendations may be updated based on patient status, additional functional criteria and insurance authorization.  Follow Up Recommendations       Assistance Recommended at Discharge Frequent or constant Supervision/Assistance  Patient can return home with the following A lot of help with walking and/or transfers;Assist for transportation;Help with stairs or ramp for entrance   Equipment Recommendations  Rolling walker (2 wheels);BSC/3in1;Other (comment)    Recommendations for Other Services       Precautions / Restrictions Precautions Precautions: Fall Restrictions Weight Bearing Restrictions: Yes RLE Weight Bearing: Weight bearing as tolerated     Mobility  Bed Mobility               General bed mobility comments: Sitting EOB upon PT arrival.    Transfers Overall transfer level:  Needs assistance Equipment used: Rolling walker (2 wheels) Transfers: Sit to/from Stand Sit to Stand: Max assist           General transfer comment: Max A to power to standing from EOB x1, with right knee flexed, from toilet x1 using grab bar and from chair x1. Cues for hand/foot placement and anterior weight shift.    Ambulation/Gait Ambulation/Gait assistance: Mod assist Gait Distance (Feet): 8 Feet (+ 6') Assistive device: Rolling walker (2 wheels) Gait Pattern/deviations: Step-to pattern, Decreased stance time - right, Decreased step length - left, Antalgic Gait velocity: decreased     General Gait Details: Step-by-step cues for sequencing, using RW to unweigh Painful RLE, dragging RLE at times needing assist to advance, only placing right toes on ground despite cues and multimodal cues to invite pt into stance on painful R LE to allow for LLE stepping. 2 seated rest breaks.   Stairs             Wheelchair Mobility    Modified Rankin (Stroke Patients Only)       Balance Overall balance assessment: Needs assistance Sitting-balance support: Feet supported, No upper extremity supported Sitting balance-Leahy Scale: Good     Standing balance support: During functional activity, Bilateral upper extremity supported, Reliant on assistive device for balance Standing balance-Leahy Scale: Poor                              Cognition Arousal/Alertness: Awake/alert Behavior During Therapy: WFL for tasks assessed/performed Overall Cognitive Status: Within Functional Limits for tasks assessed  General Comments: Seems anxious with anticipation of pain        Exercises Total Joint Exercises Ankle Circles/Pumps: AROM, Both, 10 reps, Seated Quad Sets: AROM, Both, 10 reps, Seated Long Arc Quad: AAROM, Right, 10 reps, Seated    General Comments General comments (skin integrity, edema, etc.): daughter present  during session.      Pertinent Vitals/Pain Pain Assessment Pain Assessment: Faces Faces Pain Scale: Hurts worst Pain Location: R hip/LE Pain Descriptors / Indicators: Aching, Sore, Grimacing, Guarding Pain Intervention(s): Monitored during session, Repositioned, Limited activity within patient's tolerance    Home Living                          Prior Function            PT Goals (current goals can now be found in the care plan section) Progress towards PT goals: Progressing toward goals    Frequency    7X/week      PT Plan Current plan remains appropriate    Co-evaluation              AM-PAC PT "6 Clicks" Mobility   Outcome Measure  Help needed turning from your back to your side while in a flat bed without using bedrails?: A Lot Help needed moving from lying on your back to sitting on the side of a flat bed without using bedrails?: A Lot Help needed moving to and from a bed to a chair (including a wheelchair)?: A Lot Help needed standing up from a chair using your arms (e.g., wheelchair or bedside chair)?: A Lot Help needed to walk in hospital room?: A Lot Help needed climbing 3-5 steps with a railing? : Total 6 Click Score: 11    End of Session Equipment Utilized During Treatment: Gait belt Activity Tolerance: Patient limited by pain Patient left: in chair;with call bell/phone within reach;with family/visitor present Nurse Communication: Mobility status PT Visit Diagnosis: Unsteadiness on feet (R26.81);Pain Pain - Right/Left: Right Pain - part of body: Leg     Time: 1400-1430 PT Time Calculation (min) (ACUTE ONLY): 30 min  Charges:  $Gait Training: 8-22 mins $Therapeutic Activity: 8-22 mins                     Marisa Severin, PT, DPT Acute Rehabilitation Services Secure chat preferred Office [place] 03/09/2023, 3:03 PM

## 2023-03-09 NOTE — Progress Notes (Signed)
    Durable Medical Equipment  (From admission, onward)           Start     Ordered   03/09/23 0943  For home use only DME lightweight manual wheelchair with seat cushion  Once       Comments: Patient suffers from hip fracture  which impairs their ability to perform daily activities like bathing in the home.  A cane will not resolve  issue with performing activities of daily living. A wheelchair will allow patient to safely perform daily activities. Patient is not able to propel themselves in the home using a standard weight wheelchair due to general weakness. Patient can self propel in the lightweight wheelchair. Length of need Lifetime. Accessories: elevating leg rests (ELRs), wheel locks, extensions and anti-tippers.   03/09/23 0944   03/09/23 0943  For home use only DME Hospital bed  Once       Comments: Standard mattress  Question Answer Comment  Length of Need Lifetime   Patient has (list medical condition): hip fracture   The above medical condition requires: Patient requires the ability to reposition frequently   Head must be elevated greater than: 30 degrees   Bed type Semi-electric      03/09/23 0944   03/09/23 0941  For home use only DME Bedside commode  Once       Comments: Patient is confined to one room and is unable to get to bathroom  Question:  Patient needs a bedside commode to treat with the following condition  Answer:  Weakness   03/09/23 0944

## 2023-03-09 NOTE — Progress Notes (Signed)
Subjective: Patient reports pain as moderate. Her knee isn't hurting as much as it was yesterday. Tolerating diet.  Urinating.  No CP, SOB.  Has mobilized OOB with PT.   Objective:   VITALS:   Vitals:   03/08/23 1501 03/08/23 2125 03/09/23 0408 03/09/23 0831  BP: (!) 142/69 133/64 (!) 131/57   Pulse: (!) 55 63 62   Resp: 18 18    Temp: 97.9 F (36.6 C)  98.3 F (36.8 C) 99.1 F (37.3 C)  TempSrc: Oral  Oral Oral  SpO2: 100% 100% 96%   Weight:      Height:          Latest Ref Rng & Units 03/09/2023    2:03 AM 03/08/2023   11:48 AM 03/08/2023    3:42 AM  CBC  WBC 4.0 - 10.5 K/uL 9.2  11.2  10.0   Hemoglobin 12.0 - 15.0 g/dL 8.8  9.9  9.6   Hematocrit 36.0 - 46.0 % 25.9  29.4  30.0   Platelets 150 - 400 K/uL 89  101  92       Latest Ref Rng & Units 03/09/2023    2:03 AM 03/08/2023    3:42 AM 03/07/2023   10:41 PM  BMP  Glucose 70 - 99 mg/dL 123  130  178   BUN 8 - 23 mg/dL 19  11  11    Creatinine 0.44 - 1.00 mg/dL 1.07  0.89  1.05   Sodium 135 - 145 mmol/L 135  138  136   Potassium 3.5 - 5.1 mmol/L 5.2  4.3  4.5   Chloride 98 - 111 mmol/L 102  108  104   CO2 22 - 32 mmol/L 25  23  23    Calcium 8.9 - 10.3 mg/dL 8.9  8.5  9.0    Intake/Output      03/25 0701 03/26 0700 03/26 0701 03/27 0700   P.O. 480    I.V. (mL/kg)     IV Piggyback     Total Intake(mL/kg) 480 (6.8)    Urine (mL/kg/hr)     Blood     Total Output     Net +480         Urine Occurrence 2 x    Stool Occurrence 1 x       Physical Exam: General: NAD.  Sititng up on side of bed, calm, comfortable Resp: No increased wob Cardio: regular rate and rhythm ABD soft Neurologically intact MSK Neurovascularly intact Sensation intact distally Intact pulses distally Dorsiflexion/Plantar flexion intact Incision: dressing C/D/I   Assessment: 2 Days Post-Op  S/P Procedure(s) (LRB): TOTAL HIP ARTHROPLASTY ANTERIOR APPROACH (Right) by Dr. Ernesta Amble. Percell Miller on 03/07/23  Principal Problem:    Closed displaced fracture of right femoral neck ([facility]) Active Problems:   Syncope   Hypokalemia   Hypotension   Diabetes mellitus type 2 in nonobese ([facility])   CAD (coronary atherosclerotic disease)   SVT (supraventricular tachycardia)   Plan:  Advance diet Up with therapy Incentive Spirometry Elevate and Apply ice  Weightbearing: WBAT RLE Insicional and dressing care: Dressings left intact until follow-up and Reinforce dressings as needed Orthopedic device(s): None Showering: Keep dressing dry VTE prophylaxis: Lovenox 40mg  qd , SCDs, ambulation Pain control: continue current regimen Follow - up plan: 2 weeks post-op Contact information:  Edmonia Lynch MD, Aggie Moats PA-C  Dispo:  Home with HHPT. Ok to d/c from ortho standpoint when medically ready.       Birt Reinoso M  [place] Office [phone] 03/09/2023, 11:23 AM

## 2023-03-10 DIAGNOSIS — I471 Supraventricular tachycardia, unspecified: Secondary | ICD-10-CM | POA: Diagnosis not present

## 2023-03-12 ENCOUNTER — Other Ambulatory Visit: Payer: Self-pay

## 2023-03-12 ENCOUNTER — Telehealth: Payer: Self-pay

## 2023-03-12 ENCOUNTER — Emergency Department (HOSPITAL_COMMUNITY): Payer: Medicare HMO

## 2023-03-12 ENCOUNTER — Emergency Department (HOSPITAL_COMMUNITY)
Admission: EM | Admit: 2023-03-12 | Discharge: 2023-03-12 | Disposition: A | Discharge status: Home / Self Care | Payer: Medicare HMO | Attending: Emergency Medicine | Admitting: Emergency Medicine

## 2023-03-12 DIAGNOSIS — I2510 Atherosclerotic heart disease of native coronary artery without angina pectoris: Secondary | ICD-10-CM | POA: Insufficient documentation

## 2023-03-12 DIAGNOSIS — R61 Generalized hyperhidrosis: Secondary | ICD-10-CM | POA: Diagnosis not present

## 2023-03-12 DIAGNOSIS — D72829 Elevated white blood cell count, unspecified: Secondary | ICD-10-CM | POA: Insufficient documentation

## 2023-03-12 DIAGNOSIS — Z7984 Long term (current) use of oral hypoglycemic drugs: Secondary | ICD-10-CM | POA: Insufficient documentation

## 2023-03-12 DIAGNOSIS — E119 Type 2 diabetes mellitus without complications: Secondary | ICD-10-CM | POA: Diagnosis not present

## 2023-03-12 DIAGNOSIS — I959 Hypotension, unspecified: Secondary | ICD-10-CM | POA: Diagnosis not present

## 2023-03-12 DIAGNOSIS — I10 Essential (primary) hypertension: Secondary | ICD-10-CM | POA: Insufficient documentation

## 2023-03-12 DIAGNOSIS — R0902 Hypoxemia: Secondary | ICD-10-CM | POA: Diagnosis not present

## 2023-03-12 DIAGNOSIS — Z96641 Presence of right artificial hip joint: Secondary | ICD-10-CM

## 2023-03-12 DIAGNOSIS — Z7982 Long term (current) use of aspirin: Secondary | ICD-10-CM | POA: Insufficient documentation

## 2023-03-12 DIAGNOSIS — R55 Syncope and collapse: Secondary | ICD-10-CM | POA: Diagnosis not present

## 2023-03-12 DIAGNOSIS — E860 Dehydration: Secondary | ICD-10-CM | POA: Diagnosis not present

## 2023-03-12 DIAGNOSIS — Z79899 Other long term (current) drug therapy: Secondary | ICD-10-CM | POA: Diagnosis not present

## 2023-03-12 DIAGNOSIS — W19XXXA Unspecified fall, initial encounter: Secondary | ICD-10-CM | POA: Diagnosis not present

## 2023-03-12 LAB — CBC
HCT: 29.6 % — ABNORMAL LOW (ref 36.0–46.0)
Hemoglobin: 9.4 g/dL — ABNORMAL LOW (ref 12.0–15.0)
MCH: 25.3 pg — ABNORMAL LOW (ref 26.0–34.0)
MCHC: 31.8 g/dL (ref 30.0–36.0)
MCV: 79.6 fL — ABNORMAL LOW (ref 80.0–100.0)
Platelets: 149 10*3/uL — ABNORMAL LOW (ref 150–400)
RBC: 3.72 MIL/uL — ABNORMAL LOW (ref 3.87–5.11)
RDW: 14.7 % (ref 11.5–15.5)
WBC: 11 10*3/uL — ABNORMAL HIGH (ref 4.0–10.5)
nRBC: 0 % (ref 0.0–0.2)

## 2023-03-12 LAB — BASIC METABOLIC PANEL
Anion gap: 13 (ref 5–15)
BUN: 15 mg/dL (ref 8–23)
CO2: 21 mmol/L — ABNORMAL LOW (ref 22–32)
Calcium: 8.8 mg/dL — ABNORMAL LOW (ref 8.9–10.3)
Chloride: 101 mmol/L (ref 98–111)
Creatinine, Ser: 0.85 mg/dL (ref 0.44–1.00)
GFR, Estimated: >60 mL/min (ref >60)
Glucose, Bld: 113 mg/dL — ABNORMAL HIGH (ref 70–99)
Potassium: 3.8 mmol/L (ref 3.5–5.1)
Sodium: 135 mmol/L (ref 135–145)

## 2023-03-12 LAB — TROPONIN I (HIGH SENSITIVITY): Troponin I (High Sensitivity): 6 ng/L (ref <18)

## 2023-03-12 MED ORDER — SODIUM CHLORIDE 0.9 % IV BOLUS
1000.0000 mL | Freq: Once | INTRAVENOUS | Status: AC
  Administered 2023-03-12: 1000 mL via INTRAVENOUS

## 2023-03-12 MED ORDER — HYDROCODONE-ACETAMINOPHEN 5-325 MG PO TABS
1.0000 | ORAL_TABLET | Freq: Once | ORAL | Status: AC
  Administered 2023-03-12: 1 via ORAL
  Filled 2023-03-12: qty 1

## 2023-03-12 NOTE — ED Provider Notes (Signed)
[facility] Provider Note   CSN: [id number] Arrival date & time: 03/12/23  1252     History  Chief Complaint  Patient presents with   Loss of Consciousness    [name] is a 82 y.o. female with medical history of CAD, diabetes, hypertension, microcytic anemia.  Patient presents to ED for evaluation of near syncope.  Patient reports that on 03/07/2023, 5 days ago, she had total hip arthroplasty of the right side.  On chart review it appears that this was done by Dr. Percell Miller.  Patient reports that she was discharged after 2 days.  Patient reports that she has been staying at home with her daughter, son-in-law and their children.  Patient reports that daughter and son-in-law are taking care of her at home.  Patient states that ever since being discharged she has had decreased oral intake secondary to fear of having to urinate.  Patient reports that whenever she stands up to ambulate, her hip pain is acutely worsened.  Patient states that today at 1 point she attempted to stand up however got very lightheaded and dizzy and had to sit back down.  The patient denies losing consciousness.  Patient denies chest pain, shortness of breath, nausea, vomiting, fevers, diarrhea at home.  Patient reports that she has been taking Tylenol, Aleve at home without any significant difference in her pain.  Patient reports last bowel movement this morning.   Loss of Consciousness Associated symptoms: no chest pain, no nausea, no shortness of breath and no vomiting        Home Medications Prior to Admission medications   Medication Sig Start Date End Date Taking? Authorizing Provider  acetaminophen (TYLENOL) 500 MG tablet Take 1 tablet (500 mg total) by mouth every 8 (eight) hours as needed for moderate pain or mild pain. 03/09/23 04/08/23  Leigh Aurora, DO  allopurinol (ZYLOPRIM) 100 MG tablet Take 100 mg by mouth daily. 11/29/17   [provider]   Apoaequorin (PREVAGEN PO) Take 1 capsule by mouth daily.    [provider]  aspirin EC (ASPIRIN LOW DOSE) 81 MG tablet Take 1 tablet (81 mg total) by mouth 2 (two) times daily. Swallow whole. 03/09/23 04/08/23  Leigh Aurora, DO  Blood Glucose Monitoring Suppl (TRUE METRIX AIR GLUCOSE METER) w/Device KIT  12/13/17   [provider]  COLCRYS 0.6 MG tablet Take 0.6 mg by mouth See admin instructions. Take 0.6 mg by mouth daily as directed for gout flares 06/22/16   [provider]  diclofenac Sodium (VOLTAREN) 1 % GEL Apply 2 g topically 2 (two) times daily as needed (for pain- to affected sites). 01/14/22   [provider]  hydrochlorothiazide (HYDRODIURIL) 25 MG tablet Take 1 tablet (25 mg total) by mouth daily. Patient not taking: Reported on 03/07/2023 12/03/15   Burtis Junes, NP  ibuprofen (ADVIL) 400 MG tablet Take 1 tablet (400 mg total) by mouth every 6 (six) hours as needed for up to 5 days for mild pain. 03/09/23 03/14/23  Leigh Aurora, DO  Iron Polysacch Cmplx-B12-FA 150-0.025-1 MG CAPS Take 1 capsule by mouth every Monday, Wednesday, and Friday. 03/10/23 04/09/23  Leigh Aurora, DO  latanoprost (XALATAN) 0.005 % ophthalmic solution Place 1 drop into both eyes at bedtime.    [provider]  lisinopril (ZESTRIL) 20 MG tablet Take 1 tablet (20 mg total) by mouth daily. 10/20/19   Belva Crome, MD  metFORMIN (GLUCOPHAGE) 500 MG tablet Take  500 mg by mouth daily. 06/24/16   [provider]  metoprolol succinate (TOPROL-XL) 50 MG 24 hr tablet TAKE 1 TABLET (50 MG TOTAL) BY MOUTH DAILY. Patient not taking: Reported on 03/07/2023 08/11/16   Belva Crome, MD  nitroGLYCERIN (NITROSTAT) 0.4 MG SL tablet Place 1 tablet (0.4 mg total) under the tongue every 5 (five) minutes as needed for chest pain. 05/30/18   Belva Crome, MD  oxyCODONE (OXY IR/ROXICODONE) 5 MG immediate release tablet Take 1 tablet (5 mg total) by mouth every 6 (six) hours as needed for  up to 3 days for moderate pain or severe pain. 03/09/23 03/12/23  Leigh Aurora, DO  pantoprazole (PROTONIX) 40 MG tablet Take 40 mg by mouth daily as needed. Acid reflux Patient not taking: Reported on 03/07/2023 10/20/19   Belva Crome, MD  rosuvastatin (CRESTOR) 40 MG tablet Take 1 tablet (40 mg total) by mouth daily. 12/23/22   Tobb, Kardie, DO  senna-docusate (SENOKOT-S) 8.6-50 MG tablet Take 1 tablet by mouth 2 (two) times daily. 03/09/23 04/08/23  Leigh Aurora, DO  TRUE METRIX BLOOD GLUCOSE TEST test strip  12/08/17   [provider]  TRUEPLUS LANCETS 33G [place]  12/13/17   [provider]  Vitamin D, Ergocalciferol, (DRISDOL) 1.25 MG (50000 UNIT) CAPS capsule Take 1 capsule (50,000 Units total) by mouth every 7 (seven) days. 03/14/23 04/13/23  Leigh Aurora, DO      Allergies    Patient has no known allergies.    Review of Systems   Review of Systems  Respiratory:  Negative for shortness of breath.   Cardiovascular:  Positive for syncope. Negative for chest pain.  Gastrointestinal:  Negative for diarrhea, nausea and vomiting.  Neurological:  Positive for light-headedness. Negative for syncope.  All other systems reviewed and are negative.   Physical Exam Updated Vital Signs BP (!) 144/49   Pulse 69   Temp 97.8 F (36.6 C) (Oral)   Resp (!) 21   Ht 5\' 4"  (1.626 m)   Wt 69.9 kg   SpO2 100%   BMI 26.43 kg/m  Physical Exam Vitals and nursing note reviewed.  Constitutional:      General: She is not in acute distress.    Appearance: Normal appearance. She is not ill-appearing, toxic-appearing or diaphoretic.  HENT:     Head: Normocephalic and atraumatic.     Nose: Nose normal.     Mouth/Throat:     Mouth: Mucous membranes are moist.     Pharynx: Oropharynx is clear.  Eyes:     Extraocular Movements: Extraocular movements intact.     Conjunctiva/sclera: Conjunctivae normal.     Pupils: Pupils are equal, round, and reactive to light.  Cardiovascular:     Rate and  Rhythm: Normal rate and regular rhythm.  Pulmonary:     Effort: Pulmonary effort is normal.     Breath sounds: Normal breath sounds. No wheezing.  Abdominal:     General: Abdomen is flat. Bowel sounds are normal.     Palpations: Abdomen is soft.     Tenderness: There is no abdominal tenderness.  Musculoskeletal:     Cervical back: Normal range of motion and neck supple. No tenderness.  Skin:    General: Skin is warm and dry.     Capillary Refill: Capillary refill takes less than 2 seconds.  Neurological:     Mental Status: She is alert and oriented to person, place, and time.     ED Results /  Procedures / Treatments   Labs (all labs ordered are listed, but only abnormal results are displayed) Labs Reviewed  CBC - Abnormal; Notable for the following components:      Result Value   WBC 11.0 (*)    RBC 3.72 (*)    Hemoglobin 9.4 (*)    HCT 29.6 (*)    MCV 79.6 (*)    MCH 25.3 (*)    Platelets 149 (*)    All other components within normal limits  BASIC METABOLIC PANEL - Abnormal; Notable for the following components:   CO2 21 (*)    Glucose, Bld 113 (*)    Calcium 8.8 (*)    All other components within normal limits  TROPONIN I (HIGH SENSITIVITY)    EKG EKG Interpretation  Date/Time:  Friday March 12 2023 12:59:35 EDT Ventricular Rate:  75 PR Interval:  123 QRS Duration: 100 QT Interval:  396 QTC Calculation: 443 R Axis:   34 Text Interpretation: Sinus rhythm RSR' in V1 or V2, right VCD or RVH Probable left ventricular hypertrophy Confirmed by Garnette Gunner [phone]) on 03/12/2023 2:31:50 PM  Radiology DG Chest 2 View  Result Date: 03/12/2023 CLINICAL DATA:  Syncope EXAM: CHEST - 2 VIEW COMPARISON:  03/06/2023 FINDINGS: No consolidation, pneumothorax or effusion. No edema. Normal cardiopericardial silhouette. Overlapping cardiac leads. IMPRESSION: No acute cardiopulmonary disease Electronically Signed   By: Jill Side M.D.   On: 03/12/2023 15:08     Procedures Procedures    Medications Ordered in ED Medications  sodium chloride 0.9 % bolus 1,000 mL (1,000 mLs Intravenous New Bag/Given 03/12/23 1336)  HYDROcodone-acetaminophen (NORCO/VICODIN) 5-325 MG per tablet 1 tablet (1 tablet Oral Given 03/12/23 1336)    ED Course/ Medical Decision Making/ A&P  Medical Decision Making Amount and/or Complexity of Data Reviewed Labs: ordered. Radiology: ordered.  Risk Prescription drug management.   82 year old female presents ED for evaluation.  Please see HPI for further details.  On exam patient afebrile and nontachycardic.  Lung sounds clear bilaterally, she is not hypoxic.  Abdomen soft and compressible throughout.  Neurological examination at baseline.  No focal neurodeficits noted.  No peripheral edema bilaterally.   Patient CBC with leukocytosis to 11, hemoglobin of 9.4 which is patient baseline.  BMP with no electrolyte derangement, stable creatinine.  Initial troponin 6, no delta be collected as the patient denies chest pain.  Chest x-ray unremarkable.  EKG nonischemic.  Patient provided 5 mg hydrocodone for pain.  1 L fluid.  Patient reports feeling much better after these were given.  Patient will be signed out to oncoming provider Reimholdt PA-C pending reassessment.  Final Clinical Impression(s) / ED Diagnoses Final diagnoses:  Near syncope  S/P total right hip arthroplasty    Rx / DC Orders ED Discharge Orders     None         Lawana Chambers 03/12/23 1543    Cristie Hem, MD 03/13/23 1329

## 2023-03-12 NOTE — ED Triage Notes (Signed)
Pt BIB [facility] from home due to syncopal episodes since surgery in the past week.  Pt had right hip replacement surgery on Saturday 03/06/23.  Initially EMS called due to unresponsiveness of pt due to syncopal episode.  Pt reports decreased input due to not wanting to "get up and experience pain" from right hip surgery.  VS BP 116/62, CBG 153, HR 70.  20g left AC.  390ml NS given en route.

## 2023-03-12 NOTE — ED Provider Notes (Signed)
Accepted handoff at shift change from [facility]. Please see prior provider note for full HPI.  Briefly: Patient is a 82 y.o. female who presents to the ER for near syncopal episode. Underwent right hip replacement surgery on 3/23 by Dr Percell Miller. Says she has not been drinking much fluids because she has not wanted to get up to use the bathroom due to hip pain. EMS was called after patient tried to get up out of wheelchair and felt faint, apparently family had started compressions however EMS said she was in cardiac arrest, and patient's holter monitor in place.   DDX/Plan: Follow up on labs and troponin value. Likely d/c to home after fluids.   Physical Exam  BP (!) 149/59 (BP Location: Right Arm)   Pulse 73   Temp 97.8 F (36.6 C) (Oral)   Resp 13   Ht 5\' 4"  (1.626 m)   Wt 69.9 kg   SpO2 100%   BMI 26.43 kg/m   Physical Exam Vitals and nursing note reviewed.  Constitutional:      Appearance: Normal appearance.  HENT:     Head: Normocephalic and atraumatic.  Eyes:     Conjunctiva/sclera: Conjunctivae normal.  Cardiovascular:     Rate and Rhythm: Normal rate and regular rhythm.  Pulmonary:     Effort: Pulmonary effort is normal. No respiratory distress.     Breath sounds: Normal breath sounds.  Abdominal:     General: There is no distension.     Palpations: Abdomen is soft.     Tenderness: There is no abdominal tenderness.  Skin:    General: Skin is warm and dry.  Neurological:     General: No focal deficit present.     Mental Status: She is alert.    Results   Results for orders placed or performed during the hospital encounter of 03/12/23  CBC  Result Value Ref Range   WBC 11.0 (H) 4.0 - 10.5 K/uL   RBC 3.72 (L) 3.87 - 5.11 MIL/uL   Hemoglobin 9.4 (L) 12.0 - 15.0 g/dL   HCT 29.6 (L) 36.0 - 46.0 %   MCV 79.6 (L) 80.0 - 100.0 fL   MCH 25.3 (L) 26.0 - 34.0 pg   MCHC 31.8 30.0 - 36.0 g/dL   RDW 14.7 11.5 - 15.5 %   Platelets 149 (L) 150 - 400 K/uL   nRBC 0.0 0.0 -  0.2 %  Basic metabolic panel  Result Value Ref Range   Sodium 135 135 - 145 mmol/L   Potassium 3.8 3.5 - 5.1 mmol/L   Chloride 101 98 - 111 mmol/L   CO2 21 (L) 22 - 32 mmol/L   Glucose, Bld 113 (H) 70 - 99 mg/dL   BUN 15 8 - 23 mg/dL   Creatinine, Ser 0.85 0.44 - 1.00 mg/dL   Calcium 8.8 (L) 8.9 - 10.3 mg/dL   GFR, Estimated >60 >60 mL/min   Anion gap 13 5 - 15  Troponin I (High Sensitivity)  Result Value Ref Range   Troponin I (High Sensitivity) 6 <18 ng/L    ED Course / MDM    Medical Decision Making Amount and/or Complexity of Data Reviewed Labs: ordered. Radiology: ordered.  Risk Prescription drug management.   I reviewed patient's labs with the following pertinent results: Mild leukocytosis of 11, stable compared to prior.  Improvement in hemoglobin to 9.4.  BMP grossly unremarkable, normal kidney function.  Troponin 6.  Chest x-ray without acute abnormalities.  EKG with sinus rhythm  and probable LVH, interpreted by my attending physician.  Reevaluated patient, she states that she feels much improved after IV fluids.  Her family member at bedside states that she was supposed to work with physical therapy today, and they will come back and work with her at another time.  She feels comfortable bringing her home.  Patient understands that she is to use the walker whenever she needs to get up herself.  She states that she will try to hydrate better, as it is likely her symptoms are related to dehydration due to poor p.o. intake.  She is currently wearing Holter monitor, and plans to follow-up with her primary doctor about this.  After consideration of the diagnostic results and the patients response to treatment, I feel that emergency department workup does not suggest an emergent condition requiring admission or immediate intervention beyond what has been performed at this time. The plan is: Charged home with recommendation for increased p.o. intake and follow-up with PCP.  Low  concern for ACS as the cause of patient's symptoms today as her troponin was negative and she has no ischemic changes on EKG.  She did not have any preceding chest pain or palpitations. The patient is safe for discharge and has been instructed to return immediately for worsening symptoms, change in symptoms or any other concerns.    Estill Cotta 03/12/23 1541    Lacretia Leigh, MD 03/15/23 [phone]

## 2023-03-12 NOTE — ED Notes (Signed)
Additional info from EMS: family initiated chest compressions prior to EMS arrival because they thought pt was in cardiac arrest. EMS reports pt was not in cardiac arrest and is wearing a holter monitor with the recorder in her bag

## 2023-03-12 NOTE — Transitions of Care (Post Inpatient/ED Visit) (Signed)
   03/12/2023  Name: FOSTER STOGSDILL MRN: [id number] DOB: 1940-12-17  Today's TOC FU Call Status:EMMI  Attempted to reach the patient regarding the most recent Inpatient/ED visit. Follow Up Plan: Additional outreach attempts will be made to reach the patient to complete the Transitions of Care (Post Inpatient/ED visit) call.   Johnney Killian, RN, BSN, CCM Care Management Coordinator [place] Phone: [phone]: [phone]

## 2023-03-12 NOTE — Discharge Instructions (Addendum)
You are seen emergency department today after almost passing out.  As we discussed your lab work was reassuring, glad that you feel improved after some IV fluids.  Make sure you're trying to stay well-hydrated at home.  Please follow-up with your doctor regarding your heart monitor.  The doctor time with physical therapy you will feel more comfortable transferring from your bed, but make sure you are using your walker.  Continue to monitor how you're doing and return to the ER for new or worsening symptoms.

## 2023-03-13 DIAGNOSIS — I2510 Atherosclerotic heart disease of native coronary artery without angina pectoris: Secondary | ICD-10-CM | POA: Diagnosis not present

## 2023-03-13 DIAGNOSIS — E785 Hyperlipidemia, unspecified: Secondary | ICD-10-CM | POA: Diagnosis not present

## 2023-03-13 DIAGNOSIS — Z96641 Presence of right artificial hip joint: Secondary | ICD-10-CM | POA: Diagnosis not present

## 2023-03-13 DIAGNOSIS — D509 Iron deficiency anemia, unspecified: Secondary | ICD-10-CM | POA: Diagnosis not present

## 2023-03-13 DIAGNOSIS — I10 Essential (primary) hypertension: Secondary | ICD-10-CM | POA: Diagnosis not present

## 2023-03-13 DIAGNOSIS — S72001D Fracture of unspecified part of neck of right femur, subsequent encounter for closed fracture with routine healing: Secondary | ICD-10-CM | POA: Diagnosis not present

## 2023-03-13 DIAGNOSIS — I471 Supraventricular tachycardia, unspecified: Secondary | ICD-10-CM | POA: Diagnosis not present

## 2023-03-13 DIAGNOSIS — E559 Vitamin D deficiency, unspecified: Secondary | ICD-10-CM | POA: Diagnosis not present

## 2023-03-13 DIAGNOSIS — E119 Type 2 diabetes mellitus without complications: Secondary | ICD-10-CM | POA: Diagnosis not present

## 2023-03-15 ENCOUNTER — Telehealth: Payer: Self-pay

## 2023-03-15 DIAGNOSIS — R000 Tachycardia, unspecified: Secondary | ICD-10-CM | POA: Diagnosis not present

## 2023-03-15 DIAGNOSIS — I10 Essential (primary) hypertension: Secondary | ICD-10-CM | POA: Diagnosis not present

## 2023-03-15 DIAGNOSIS — S72141D Displaced intertrochanteric fracture of right femur, subsequent encounter for closed fracture with routine healing: Secondary | ICD-10-CM | POA: Diagnosis not present

## 2023-03-15 DIAGNOSIS — E113293 Type 2 diabetes mellitus with mild nonproliferative diabetic retinopathy without macular edema, bilateral: Secondary | ICD-10-CM | POA: Diagnosis not present

## 2023-03-15 DIAGNOSIS — M1A9XX0 Chronic gout, unspecified, without tophus (tophi): Secondary | ICD-10-CM | POA: Diagnosis not present

## 2023-03-15 NOTE — Transitions of Care (Post Inpatient/ED Visit) (Signed)
   03/15/2023  Name: [name] MRN: [id number] DOB: November 07, 1941  Today's TOC FU Call Status: EMMI Today's TOC FU Call Status:: Successful TOC FU Call Competed TOC FU Call Complete Date: 03/15/23  Transition Care Management Follow-up Telephone Call Date of Discharge: 03/12/23 Discharge [facility]) Type of Discharge: Emergency Department Primary Inpatient Discharge Diagnosis:: Near Syncope How have you been since you were released from the hospital?: Better Any questions or concerns?: No (patient having hip pain)  Items Reviewed: Did you receive and understand the discharge instructions provided?: Yes Medications obtained and verified?: Yes (Medications Reviewed) Any new allergies since your discharge?: No Dietary orders reviewed?: No Do you have support at home?: Yes People in Home: child(ren), adult Name of Support/Comfort Primary Source: Son-in law and daughter assist patient  Home Care and Equipment/Supplies: [place] Ordered?: Yes Name of [place]:: Alvis Lemmings Has Agency set up a time to come to your home?: Yes [place] Visit Date: 03/13/23 Any new equipment or medical supplies ordered?: No  Functional Questionnaire: Do you need assistance with bathing/showering or dressing?: Yes Do you need assistance with meal preparation?: Yes Do you need assistance with eating?: No Do you have difficulty maintaining continence: No Do you need assistance with getting out of bed/getting out of a chair/moving?: Yes Do you have difficulty managing or taking your medications?: No  Follow up appointments reviewed: PCP Follow-up appointment confirmed?: Yes Date of PCP follow-up appointment?: 03/23/23 Follow-up Provider: Dr. Jeanie Cooks Specialist [facility] Follow-up appointment confirmed?: Yes Follow-Up Specialty Provider:: Patient could not provide date- Dr. Percell Miller Do you need transportation to your follow-up appointment?: No Do you understand  care options if your condition(s) worsen?: Yes-patient verbalized understanding  SDOH Interventions Today    Flowsheet Row Most Recent Value  SDOH Interventions   Food Insecurity Interventions Intervention Not Indicated  Housing Interventions Intervention Not Indicated      Johnney Killian, RN, BSN, CCM Care Management Coordinator [facility] Phone: [phone]: [phone]

## 2023-03-16 DIAGNOSIS — D509 Iron deficiency anemia, unspecified: Secondary | ICD-10-CM | POA: Diagnosis not present

## 2023-03-16 DIAGNOSIS — E119 Type 2 diabetes mellitus without complications: Secondary | ICD-10-CM | POA: Diagnosis not present

## 2023-03-16 DIAGNOSIS — E785 Hyperlipidemia, unspecified: Secondary | ICD-10-CM | POA: Diagnosis not present

## 2023-03-16 DIAGNOSIS — Z96641 Presence of right artificial hip joint: Secondary | ICD-10-CM | POA: Diagnosis not present

## 2023-03-16 DIAGNOSIS — I10 Essential (primary) hypertension: Secondary | ICD-10-CM | POA: Diagnosis not present

## 2023-03-16 DIAGNOSIS — I471 Supraventricular tachycardia, unspecified: Secondary | ICD-10-CM | POA: Diagnosis not present

## 2023-03-16 DIAGNOSIS — E559 Vitamin D deficiency, unspecified: Secondary | ICD-10-CM | POA: Diagnosis not present

## 2023-03-16 DIAGNOSIS — S72001D Fracture of unspecified part of neck of right femur, subsequent encounter for closed fracture with routine healing: Secondary | ICD-10-CM | POA: Diagnosis not present

## 2023-03-16 DIAGNOSIS — I2510 Atherosclerotic heart disease of native coronary artery without angina pectoris: Secondary | ICD-10-CM | POA: Diagnosis not present

## 2023-03-17 DIAGNOSIS — I471 Supraventricular tachycardia, unspecified: Secondary | ICD-10-CM | POA: Diagnosis not present

## 2023-03-17 DIAGNOSIS — S72001D Fracture of unspecified part of neck of right femur, subsequent encounter for closed fracture with routine healing: Secondary | ICD-10-CM | POA: Diagnosis not present

## 2023-03-17 DIAGNOSIS — E559 Vitamin D deficiency, unspecified: Secondary | ICD-10-CM | POA: Diagnosis not present

## 2023-03-17 DIAGNOSIS — E785 Hyperlipidemia, unspecified: Secondary | ICD-10-CM | POA: Diagnosis not present

## 2023-03-17 DIAGNOSIS — E119 Type 2 diabetes mellitus without complications: Secondary | ICD-10-CM | POA: Diagnosis not present

## 2023-03-17 DIAGNOSIS — I10 Essential (primary) hypertension: Secondary | ICD-10-CM | POA: Diagnosis not present

## 2023-03-17 DIAGNOSIS — Z96641 Presence of right artificial hip joint: Secondary | ICD-10-CM | POA: Diagnosis not present

## 2023-03-17 DIAGNOSIS — I2510 Atherosclerotic heart disease of native coronary artery without angina pectoris: Secondary | ICD-10-CM | POA: Diagnosis not present

## 2023-03-17 DIAGNOSIS — D509 Iron deficiency anemia, unspecified: Secondary | ICD-10-CM | POA: Diagnosis not present

## 2023-03-18 DIAGNOSIS — E119 Type 2 diabetes mellitus without complications: Secondary | ICD-10-CM | POA: Diagnosis not present

## 2023-03-18 DIAGNOSIS — Z96641 Presence of right artificial hip joint: Secondary | ICD-10-CM | POA: Diagnosis not present

## 2023-03-18 DIAGNOSIS — I10 Essential (primary) hypertension: Secondary | ICD-10-CM | POA: Diagnosis not present

## 2023-03-18 DIAGNOSIS — E559 Vitamin D deficiency, unspecified: Secondary | ICD-10-CM | POA: Diagnosis not present

## 2023-03-18 DIAGNOSIS — D509 Iron deficiency anemia, unspecified: Secondary | ICD-10-CM | POA: Diagnosis not present

## 2023-03-18 DIAGNOSIS — S72001D Fracture of unspecified part of neck of right femur, subsequent encounter for closed fracture with routine healing: Secondary | ICD-10-CM | POA: Diagnosis not present

## 2023-03-18 DIAGNOSIS — I471 Supraventricular tachycardia, unspecified: Secondary | ICD-10-CM | POA: Diagnosis not present

## 2023-03-18 DIAGNOSIS — I2510 Atherosclerotic heart disease of native coronary artery without angina pectoris: Secondary | ICD-10-CM | POA: Diagnosis not present

## 2023-03-18 DIAGNOSIS — E785 Hyperlipidemia, unspecified: Secondary | ICD-10-CM | POA: Diagnosis not present

## 2023-03-22 DIAGNOSIS — I2510 Atherosclerotic heart disease of native coronary artery without angina pectoris: Secondary | ICD-10-CM | POA: Diagnosis not present

## 2023-03-22 DIAGNOSIS — S72001D Fracture of unspecified part of neck of right femur, subsequent encounter for closed fracture with routine healing: Secondary | ICD-10-CM | POA: Diagnosis not present

## 2023-03-22 DIAGNOSIS — E113293 Type 2 diabetes mellitus with mild nonproliferative diabetic retinopathy without macular edema, bilateral: Secondary | ICD-10-CM | POA: Diagnosis not present

## 2023-03-22 DIAGNOSIS — I10 Essential (primary) hypertension: Secondary | ICD-10-CM | POA: Diagnosis not present

## 2023-03-23 DIAGNOSIS — I10 Essential (primary) hypertension: Secondary | ICD-10-CM | POA: Diagnosis not present

## 2023-03-23 DIAGNOSIS — D509 Iron deficiency anemia, unspecified: Secondary | ICD-10-CM | POA: Diagnosis not present

## 2023-03-23 DIAGNOSIS — S72001D Fracture of unspecified part of neck of right femur, subsequent encounter for closed fracture with routine healing: Secondary | ICD-10-CM | POA: Diagnosis not present

## 2023-03-23 DIAGNOSIS — I2510 Atherosclerotic heart disease of native coronary artery without angina pectoris: Secondary | ICD-10-CM | POA: Diagnosis not present

## 2023-03-23 DIAGNOSIS — E119 Type 2 diabetes mellitus without complications: Secondary | ICD-10-CM | POA: Diagnosis not present

## 2023-03-23 DIAGNOSIS — E785 Hyperlipidemia, unspecified: Secondary | ICD-10-CM | POA: Diagnosis not present

## 2023-03-23 DIAGNOSIS — I471 Supraventricular tachycardia, unspecified: Secondary | ICD-10-CM | POA: Diagnosis not present

## 2023-03-23 DIAGNOSIS — Z96641 Presence of right artificial hip joint: Secondary | ICD-10-CM | POA: Diagnosis not present

## 2023-03-23 DIAGNOSIS — E559 Vitamin D deficiency, unspecified: Secondary | ICD-10-CM | POA: Diagnosis not present

## 2023-03-24 DIAGNOSIS — S72041A Displaced fracture of base of neck of right femur, initial encounter for closed fracture: Secondary | ICD-10-CM | POA: Diagnosis not present

## 2023-03-24 DIAGNOSIS — I471 Supraventricular tachycardia, unspecified: Secondary | ICD-10-CM | POA: Diagnosis not present

## 2023-03-24 DIAGNOSIS — E559 Vitamin D deficiency, unspecified: Secondary | ICD-10-CM | POA: Diagnosis not present

## 2023-03-24 DIAGNOSIS — D509 Iron deficiency anemia, unspecified: Secondary | ICD-10-CM | POA: Diagnosis not present

## 2023-03-24 DIAGNOSIS — Z96641 Presence of right artificial hip joint: Secondary | ICD-10-CM | POA: Diagnosis not present

## 2023-03-24 DIAGNOSIS — S72001D Fracture of unspecified part of neck of right femur, subsequent encounter for closed fracture with routine healing: Secondary | ICD-10-CM | POA: Diagnosis not present

## 2023-03-24 DIAGNOSIS — I2510 Atherosclerotic heart disease of native coronary artery without angina pectoris: Secondary | ICD-10-CM | POA: Diagnosis not present

## 2023-03-24 DIAGNOSIS — E785 Hyperlipidemia, unspecified: Secondary | ICD-10-CM | POA: Diagnosis not present

## 2023-03-24 DIAGNOSIS — E119 Type 2 diabetes mellitus without complications: Secondary | ICD-10-CM | POA: Diagnosis not present

## 2023-03-24 DIAGNOSIS — I10 Essential (primary) hypertension: Secondary | ICD-10-CM | POA: Diagnosis not present

## 2023-03-24 DIAGNOSIS — M25561 Pain in right knee: Secondary | ICD-10-CM | POA: Diagnosis not present

## 2023-03-29 ENCOUNTER — Other Ambulatory Visit (HOSPITAL_COMMUNITY): Payer: Self-pay

## 2023-03-30 DIAGNOSIS — I471 Supraventricular tachycardia, unspecified: Secondary | ICD-10-CM | POA: Diagnosis not present

## 2023-03-30 DIAGNOSIS — E559 Vitamin D deficiency, unspecified: Secondary | ICD-10-CM | POA: Diagnosis not present

## 2023-03-30 DIAGNOSIS — E785 Hyperlipidemia, unspecified: Secondary | ICD-10-CM | POA: Diagnosis not present

## 2023-03-30 DIAGNOSIS — E119 Type 2 diabetes mellitus without complications: Secondary | ICD-10-CM | POA: Diagnosis not present

## 2023-03-30 DIAGNOSIS — I10 Essential (primary) hypertension: Secondary | ICD-10-CM | POA: Diagnosis not present

## 2023-03-30 DIAGNOSIS — S72001D Fracture of unspecified part of neck of right femur, subsequent encounter for closed fracture with routine healing: Secondary | ICD-10-CM | POA: Diagnosis not present

## 2023-03-30 DIAGNOSIS — I2510 Atherosclerotic heart disease of native coronary artery without angina pectoris: Secondary | ICD-10-CM | POA: Diagnosis not present

## 2023-03-30 DIAGNOSIS — D509 Iron deficiency anemia, unspecified: Secondary | ICD-10-CM | POA: Diagnosis not present

## 2023-03-30 DIAGNOSIS — Z96641 Presence of right artificial hip joint: Secondary | ICD-10-CM | POA: Diagnosis not present

## 2023-03-31 DIAGNOSIS — S72041A Displaced fracture of base of neck of right femur, initial encounter for closed fracture: Secondary | ICD-10-CM | POA: Diagnosis not present

## 2023-04-02 NOTE — Addendum Note (Signed)
Encounter addended by: Flavia Shipper on: 04/02/2023 4:42 PM  Actions taken: Imaging Exam ended

## 2023-04-06 DIAGNOSIS — D509 Iron deficiency anemia, unspecified: Secondary | ICD-10-CM | POA: Diagnosis not present

## 2023-04-06 DIAGNOSIS — E559 Vitamin D deficiency, unspecified: Secondary | ICD-10-CM | POA: Diagnosis not present

## 2023-04-06 DIAGNOSIS — M25561 Pain in right knee: Secondary | ICD-10-CM | POA: Diagnosis not present

## 2023-04-06 DIAGNOSIS — S72001D Fracture of unspecified part of neck of right femur, subsequent encounter for closed fracture with routine healing: Secondary | ICD-10-CM | POA: Diagnosis not present

## 2023-04-06 DIAGNOSIS — E785 Hyperlipidemia, unspecified: Secondary | ICD-10-CM | POA: Diagnosis not present

## 2023-04-06 DIAGNOSIS — E119 Type 2 diabetes mellitus without complications: Secondary | ICD-10-CM | POA: Diagnosis not present

## 2023-04-06 DIAGNOSIS — S72041D Displaced fracture of base of neck of right femur, subsequent encounter for closed fracture with routine healing: Secondary | ICD-10-CM | POA: Diagnosis not present

## 2023-04-06 DIAGNOSIS — I10 Essential (primary) hypertension: Secondary | ICD-10-CM | POA: Diagnosis not present

## 2023-04-06 DIAGNOSIS — I2510 Atherosclerotic heart disease of native coronary artery without angina pectoris: Secondary | ICD-10-CM | POA: Diagnosis not present

## 2023-04-06 DIAGNOSIS — I471 Supraventricular tachycardia, unspecified: Secondary | ICD-10-CM | POA: Diagnosis not present

## 2023-04-06 DIAGNOSIS — Z96641 Presence of right artificial hip joint: Secondary | ICD-10-CM | POA: Diagnosis not present

## 2023-04-07 ENCOUNTER — Telehealth: Payer: Self-pay | Admitting: *Deleted

## 2023-04-07 NOTE — Telephone Encounter (Signed)
1st attempt to reach pt regarding a surgical clearance request and the need for an in office appointment.  Left pt a message to call back and get that scheduled.

## 2023-04-07 NOTE — Telephone Encounter (Signed)
   Pre-operative Risk Assessment    Patient Name: [name]  DOB: September 13, 1941 MRN: [id number]      Request for Surgical Clearance    Procedure:   RIGHT DISTAL FEMUR REPLACEMENT  Date of Surgery:  Clearance TBD                                 Surgeon:  Weber Cooks, MD Surgeon's Group or Practice Name:  [facility] Phone number:  [phone] Fax number:  [phone]   Type of Clearance Requested:   - Medical  - Pharmacy:  Hold Aspirin NOT INDICATED HOW LONG   Type of Anesthesia:   SPINAL   Additional requests/questions:    [name]   04/07/2023, 7:41 AM

## 2023-04-07 NOTE — Telephone Encounter (Signed)
   Name: [name]  DOB: [birth year]  MRN: [id number]  Primary Cardiologist: Thomasene Ripple, DO  Chart reviewed as part of pre-operative protocol coverage. Because of POLINA BURMASTER past medical history and time since last visit, she will require a follow-up in-office visit with Dr. Servando Salina, if possible, in order to better assess preoperative cardiovascular risk.  Pre-op covering staff: - Please schedule appointment and call patient to inform them. If patient already had an upcoming appointment within acceptable timeframe, please add "pre-op clearance" to the appointment notes so provider is aware. - Please contact requesting surgeon's office via preferred method (i.e, phone, fax) to inform them of need for appointment prior to surgery.   Joylene Grapes, NP  04/07/2023, 11:57 AM

## 2023-04-08 NOTE — Telephone Encounter (Signed)
I s/w the pt and she asked if I could call back in just a little as she is not available right this moment.

## 2023-04-08 NOTE — Telephone Encounter (Signed)
I called the pt back and when discussing she will need in office appt she said she will call back. I said that is fine, she just needs to be sure to let the operator know that she is calling to make in office appt with Dr. Servando Salina.

## 2023-04-12 NOTE — Telephone Encounter (Signed)
Reviewed schedule and spoke with Dr Mallory Shirk nurse and she was agreeable with the patient having an appointment on 05/18/23 at 11:30am.(acute slot on DOD day).   Contacted the patient and offered appointment. Patients daughter got on the phone and requested an appt for a Wednesday at 9 am. I explained that Dr Servando Salina was booked out and this appointment I'm offering on 05/18/23 at 11:30 am was the first available. Daughter stating to book the appointment, but the patient may be switching back to her old cardiologist.   Appointment scheduled and advised the patients daughter to contact the office if she needs to cancel the appointment. She voiced understanding.

## 2023-04-13 DIAGNOSIS — E785 Hyperlipidemia, unspecified: Secondary | ICD-10-CM | POA: Diagnosis not present

## 2023-04-13 DIAGNOSIS — S72001D Fracture of unspecified part of neck of right femur, subsequent encounter for closed fracture with routine healing: Secondary | ICD-10-CM | POA: Diagnosis not present

## 2023-04-13 DIAGNOSIS — I471 Supraventricular tachycardia, unspecified: Secondary | ICD-10-CM | POA: Diagnosis not present

## 2023-04-13 DIAGNOSIS — E119 Type 2 diabetes mellitus without complications: Secondary | ICD-10-CM | POA: Diagnosis not present

## 2023-04-13 DIAGNOSIS — Z96641 Presence of right artificial hip joint: Secondary | ICD-10-CM | POA: Diagnosis not present

## 2023-04-13 DIAGNOSIS — I10 Essential (primary) hypertension: Secondary | ICD-10-CM | POA: Diagnosis not present

## 2023-04-13 DIAGNOSIS — E559 Vitamin D deficiency, unspecified: Secondary | ICD-10-CM | POA: Diagnosis not present

## 2023-04-13 DIAGNOSIS — I2510 Atherosclerotic heart disease of native coronary artery without angina pectoris: Secondary | ICD-10-CM | POA: Diagnosis not present

## 2023-04-13 DIAGNOSIS — D509 Iron deficiency anemia, unspecified: Secondary | ICD-10-CM | POA: Diagnosis not present

## 2023-04-20 DIAGNOSIS — I2510 Atherosclerotic heart disease of native coronary artery without angina pectoris: Secondary | ICD-10-CM | POA: Diagnosis not present

## 2023-04-20 DIAGNOSIS — I471 Supraventricular tachycardia, unspecified: Secondary | ICD-10-CM | POA: Diagnosis not present

## 2023-04-20 DIAGNOSIS — I10 Essential (primary) hypertension: Secondary | ICD-10-CM | POA: Diagnosis not present

## 2023-04-20 DIAGNOSIS — D509 Iron deficiency anemia, unspecified: Secondary | ICD-10-CM | POA: Diagnosis not present

## 2023-04-20 DIAGNOSIS — E785 Hyperlipidemia, unspecified: Secondary | ICD-10-CM | POA: Diagnosis not present

## 2023-04-20 DIAGNOSIS — Z96641 Presence of right artificial hip joint: Secondary | ICD-10-CM | POA: Diagnosis not present

## 2023-04-20 DIAGNOSIS — E119 Type 2 diabetes mellitus without complications: Secondary | ICD-10-CM | POA: Diagnosis not present

## 2023-04-20 DIAGNOSIS — E559 Vitamin D deficiency, unspecified: Secondary | ICD-10-CM | POA: Diagnosis not present

## 2023-04-20 DIAGNOSIS — S72001D Fracture of unspecified part of neck of right femur, subsequent encounter for closed fracture with routine healing: Secondary | ICD-10-CM | POA: Diagnosis not present

## 2023-04-21 ENCOUNTER — Other Ambulatory Visit: Payer: Self-pay | Admitting: Internal Medicine

## 2023-04-21 DIAGNOSIS — Z0000 Encounter for general adult medical examination without abnormal findings: Secondary | ICD-10-CM | POA: Diagnosis not present

## 2023-04-21 DIAGNOSIS — S72401D Unspecified fracture of lower end of right femur, subsequent encounter for closed fracture with routine healing: Secondary | ICD-10-CM | POA: Diagnosis not present

## 2023-04-21 DIAGNOSIS — I10 Essential (primary) hypertension: Secondary | ICD-10-CM | POA: Diagnosis not present

## 2023-04-21 DIAGNOSIS — E7849 Other hyperlipidemia: Secondary | ICD-10-CM | POA: Diagnosis not present

## 2023-04-21 DIAGNOSIS — E113293 Type 2 diabetes mellitus with mild nonproliferative diabetic retinopathy without macular edema, bilateral: Secondary | ICD-10-CM | POA: Diagnosis not present

## 2023-04-22 LAB — COMPLETE METABOLIC PANEL WITH GFR
AG Ratio: 1.5 (calc) (ref 1.0–2.5)
ALT: 40 U/L — ABNORMAL HIGH (ref 6–29)
AST: 39 U/L — ABNORMAL HIGH (ref 10–35)
Albumin: 4.3 g/dL (ref 3.6–5.1)
Alkaline phosphatase (APISO): 142 U/L (ref 37–153)
BUN: 8 mg/dL (ref 7–25)
CO2: 24 mmol/L (ref 20–32)
Calcium: 9.9 mg/dL (ref 8.6–10.4)
Chloride: 105 mmol/L (ref 98–110)
Creat: 0.66 mg/dL (ref 0.60–0.95)
Globulin: 2.8 g/dL (calc) (ref 1.9–3.7)
Glucose, Bld: 77 mg/dL (ref 65–99)
Potassium: 3.6 mmol/L (ref 3.5–5.3)
Sodium: 142 mmol/L (ref 135–146)
Total Bilirubin: 0.4 mg/dL (ref 0.2–1.2)
Total Protein: 7.1 g/dL (ref 6.1–8.1)
eGFR: 89 mL/min/{1.73_m2} (ref >=60)

## 2023-04-22 LAB — CBC
HCT: 35.5 % (ref 35.0–45.0)
Hemoglobin: 10.9 g/dL — ABNORMAL LOW (ref 11.7–15.5)
MCH: 23.9 pg — ABNORMAL LOW (ref 27.0–33.0)
MCHC: 30.7 g/dL — ABNORMAL LOW (ref 32.0–36.0)
MCV: 77.7 fL — ABNORMAL LOW (ref 80.0–100.0)
Platelets: 205 10*3/uL (ref 140–400)
RBC: 4.57 10*6/uL (ref 3.80–5.10)
RDW: 16.4 % — ABNORMAL HIGH (ref 11.0–15.0)
WBC: 5.6 10*3/uL (ref 3.8–10.8)

## 2023-04-22 LAB — LIPID PANEL
Cholesterol: 134 mg/dL (ref <200)
HDL: 52 mg/dL (ref >=50)
LDL Cholesterol (Calc): 65 mg/dL (calc)
Non-HDL Cholesterol (Calc): 82 mg/dL (calc) (ref <130)
Total CHOL/HDL Ratio: 2.6 (calc) (ref <5.0)
Triglycerides: 90 mg/dL (ref <150)

## 2023-04-22 LAB — TSH: TSH: 0.96 mIU/L (ref 0.40–4.50)

## 2023-04-27 ENCOUNTER — Other Ambulatory Visit: Payer: Self-pay | Admitting: Internal Medicine

## 2023-04-27 DIAGNOSIS — R5381 Other malaise: Secondary | ICD-10-CM

## 2023-04-30 ENCOUNTER — Other Ambulatory Visit: Payer: Self-pay | Admitting: Internal Medicine

## 2023-04-30 DIAGNOSIS — S72091A Other fracture of head and neck of right femur, initial encounter for closed fracture: Secondary | ICD-10-CM

## 2023-04-30 DIAGNOSIS — M8580 Other specified disorders of bone density and structure, unspecified site: Secondary | ICD-10-CM

## 2023-04-30 DIAGNOSIS — Z780 Asymptomatic menopausal state: Secondary | ICD-10-CM

## 2023-05-18 ENCOUNTER — Ambulatory Visit: Payer: Medicare HMO | Admitting: Cardiology

## 2023-05-19 DIAGNOSIS — E119 Type 2 diabetes mellitus without complications: Secondary | ICD-10-CM | POA: Diagnosis not present

## 2023-05-19 DIAGNOSIS — S72091D Other fracture of head and neck of right femur, subsequent encounter for closed fracture with routine healing: Secondary | ICD-10-CM | POA: Diagnosis not present

## 2023-05-19 DIAGNOSIS — I10 Essential (primary) hypertension: Secondary | ICD-10-CM | POA: Diagnosis not present

## 2023-08-11 NOTE — Telephone Encounter (Signed)
Our office received a duplicate request today. See previous notes from original clearance request. Pt was to have an appt in office for pre op clearance. The pt cancelled her appt for 05/18/23 with Dr. Servando Salina. Pt is now scheduled to see Dr. Servando Salina 08/27/23. Will add need pre op clearance to appt notes. I will update all parties involved.

## 2023-08-27 ENCOUNTER — Ambulatory Visit: Payer: Medicare HMO | Admitting: Cardiology

## 2023-10-11 ENCOUNTER — Telehealth: Payer: Self-pay | Admitting: Cardiology

## 2023-10-11 NOTE — Telephone Encounter (Signed)
Patient is requesting to switch from Dr. Servando Salina to a provider at the [facility] office because the location is more convenient. Would either of you all be will to take her on as a patient?  Please advise, Thank you

## 2023-10-11 NOTE — Telephone Encounter (Signed)
Sure.  Thanks MJP  

## 2023-12-28 ENCOUNTER — Inpatient Hospital Stay: Admission: RE | Admit: 2023-12-28 | Payer: Medicaid Other | Source: Ambulatory Visit

## 2024-01-21 ENCOUNTER — Other Ambulatory Visit: Payer: Self-pay | Admitting: Internal Medicine

## 2024-01-21 DIAGNOSIS — Z780 Asymptomatic menopausal state: Secondary | ICD-10-CM

## 2024-01-21 DIAGNOSIS — S72091A Other fracture of head and neck of right femur, initial encounter for closed fracture: Secondary | ICD-10-CM

## 2024-01-21 DIAGNOSIS — M8580 Other specified disorders of bone density and structure, unspecified site: Secondary | ICD-10-CM

## 2024-03-09 ENCOUNTER — Encounter (INDEPENDENT_AMBULATORY_CARE_PROVIDER_SITE_OTHER): Admitting: Ophthalmology

## 2024-05-01 ENCOUNTER — Ambulatory Visit: Admitting: Podiatry

## 2024-05-01 DIAGNOSIS — L84 Corns and callosities: Secondary | ICD-10-CM

## 2024-05-01 DIAGNOSIS — M79674 Pain in right toe(s): Secondary | ICD-10-CM

## 2024-05-01 DIAGNOSIS — L600 Ingrowing nail: Secondary | ICD-10-CM

## 2024-05-01 DIAGNOSIS — E119 Type 2 diabetes mellitus without complications: Secondary | ICD-10-CM

## 2024-05-01 DIAGNOSIS — M79675 Pain in left toe(s): Secondary | ICD-10-CM | POA: Diagnosis not present

## 2024-05-01 DIAGNOSIS — B351 Tinea unguium: Secondary | ICD-10-CM

## 2024-05-01 NOTE — Progress Notes (Signed)
 Subjective: Chief Complaint  Patient presents with   Nail Problem    Pt is here for ingrown toe nail of rt great toe.   83 year old female presents the office with concerns of a possible ingrown toenail to the right big toe.  She states that her grandson tried to trim the nails and afterwards she got pain but then she went got a pedicure and once the nails were trimmed she is no longer having pain.  She states her nails are thick and they do cause discomfort as they get elongated.  Denies any swelling, redness or any drainage.  No open lesions.  Her last A1c was 5.7 on Apr 14, 2024  Objective: AAO x3, NAD DP/PT pulses palpable bilaterally, CRT less than 3 seconds Mild incurvation present right hallux toenail with slight tenderness palpation of the distal medial nail border without any edema, erythema, drainage or pus or signs of infection.  Nails are hypertrophic, dystrophic, brittle, discolored, elongated 10. No surrounding redness or drainage. Tenderness nails 1-5 bilaterally. Hyperkeratotic lesion noted left fifth digit without any underlying ulceration, drainage or signs of infection. No open lesions or pre-ulcerative lesions are identified today. No pain with calf compression, swelling, warmth, erythema  Assessment: Symptomatic onychomycosis, ingrown toenail right hallux  Plan: -All treatment options discussed with the patient including all alternatives, risks, complications.  - We ingrown toenail regarding both conservative as well as surgical options.  Her symptoms have almost completely resolved there is no signs of infection.  Discussed that if it were to recur consider partial nail avulsion which we discussed today but were not deferred today as is not causing significant issues. - Sharply debrided nails x 10 without any complications or bleeding. - Hyperkeratotic lesion noted left fifth toe without any underlying ulceration, drainage or signs of infection.  Return in about 3  months (around 08/01/2024) for routine care.  Charity Conch DPM

## 2024-05-11 ENCOUNTER — Encounter (INDEPENDENT_AMBULATORY_CARE_PROVIDER_SITE_OTHER): Admitting: Ophthalmology

## 2024-05-11 DIAGNOSIS — H35033 Hypertensive retinopathy, bilateral: Secondary | ICD-10-CM | POA: Diagnosis not present

## 2024-05-11 DIAGNOSIS — Z7984 Long term (current) use of oral hypoglycemic drugs: Secondary | ICD-10-CM

## 2024-05-11 DIAGNOSIS — E113393 Type 2 diabetes mellitus with moderate nonproliferative diabetic retinopathy without macular edema, bilateral: Secondary | ICD-10-CM

## 2024-05-11 DIAGNOSIS — H43813 Vitreous degeneration, bilateral: Secondary | ICD-10-CM

## 2024-05-11 DIAGNOSIS — I10 Essential (primary) hypertension: Secondary | ICD-10-CM

## 2024-06-28 ENCOUNTER — Other Ambulatory Visit (HOSPITAL_COMMUNITY): Payer: Self-pay

## 2024-07-10 ENCOUNTER — Encounter: Payer: Self-pay | Admitting: Cardiology

## 2024-07-10 ENCOUNTER — Ambulatory Visit: Attending: Cardiology | Admitting: Cardiology

## 2024-07-10 VITALS — BP 118/58 | HR 78 | Ht 64.0 in | Wt 154.0 lb

## 2024-07-10 DIAGNOSIS — I2510 Atherosclerotic heart disease of native coronary artery without angina pectoris: Secondary | ICD-10-CM

## 2024-07-10 DIAGNOSIS — I471 Supraventricular tachycardia, unspecified: Secondary | ICD-10-CM

## 2024-07-10 DIAGNOSIS — I10 Essential (primary) hypertension: Secondary | ICD-10-CM | POA: Diagnosis not present

## 2024-07-10 DIAGNOSIS — E782 Mixed hyperlipidemia: Secondary | ICD-10-CM

## 2024-07-10 NOTE — Progress Notes (Signed)
 Cardiology Office Note:  .   Date:  07/10/2024  ID:  [name] [place], DOB 07/26/1941, MRN [id number] PCP: Shelda Atlas, MD  [facility] Providers Cardiologist:  Newman Lawrence, MD PCP: Shelda Atlas, MD  Chief Complaint  Patient presents with   Hyperlipidemia   Hypertension   Coronary Artery Disease     [name] is a 83 y.o. 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  Patient is here today with her aide.  Her physical capacity has been limited since her hip surgery.  With her limited physical activity, she denies any chest pain or shortness of breath.  She also denies any palpitations, skipping beat sensation.  She has not had any recurrent syncope episodes.     Vitals:   07/10/24 0924  BP: (!) 118/58  Pulse: 78  SpO2: 97%      Review of Systems  Cardiovascular:  Negative for chest pain, dyspnea on exertion, leg swelling, palpitations and syncope.        Studies Reviewed: SABRA        EKG 07/10/2024: Normal sinus rhythm with sinus arrhythmia Incomplete right bundle branch block When compared with ECG of 12-Mar-2023 12:59, No significant change was found     Echocardiogram 02/2023:  1. Left ventricular ejection fraction, by estimation, is 65 to 70%. The  left ventricle has normal function. The left ventricle has no regional  wall motion abnormalities. Left ventricular diastolic parameters were  normal.   2. Right ventricular systolic function is normal. The right ventricular  size is normal.   3. The mitral valve is normal in structure. Trivial mitral valve  regurgitation. No evidence of mitral stenosis.   4. The aortic valve is tricuspid. There is mild calcification of the  aortic valve. Aortic valve regurgitation is not visualized. Aortic  valve  sclerosis/calcification is present, without any evidence of aortic  stenosis. Aortic valve area, by VTI measures   2.24 cm. Aortic valve mean gradient measures 4.0 mmHg. Aortic valve Vmax  measures 1.49 m/s.   5. The inferior vena cava is dilated in size with >50% respiratory  variability, suggesting right atrial pressure of 8 mmHg.   Long-term monitor 02/2023: Conclusion: This study shows evidence of the following:                       1. One short run of Nonsustained ventricular tachycardia                       2. Paroxysmal Supraventricular tachycardia                       3. 2 episodes 2nd degree,Mobitz Type II ( occurred during the overnight hours raising suspicion that sleep apnea may be contributing).     Labs 04/2023: Chol 134, TG 90, HDL 52, LDL 65 HbA1C 5.7% Hb 10.9 Cr 0.6 TSH 0.9  Physical Exam Vitals and nursing note reviewed.  Constitutional:      General: She is not in acute distress. Neck:     Vascular: No JVD.  Cardiovascular:     Rate and Rhythm: Normal rate and regular rhythm.     Heart sounds: Normal heart sounds. No murmur heard. Pulmonary:     Effort: Pulmonary  effort is normal.     Breath sounds: Normal breath sounds. No wheezing or rales.  Musculoskeletal:     Right lower leg: No edema.     Left lower leg: No edema.      VISIT DIAGNOSES:   ICD-10-CM   1. SVT (supraventricular tachycardia) (HCC)  I47.10 EKG 12-Lead    2. Primary hypertension  I10 EKG 12-Lead    3. Coronary artery disease involving native coronary artery of native heart without angina pectoris  I25.10 EKG 12-Lead    4. Mixed hyperlipidemia  E78.2 EKG 12-Lead       [name] is a 83 y.o. 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 Continue aspirin , statin, lipids very well-controlled.  PSVT, intermittent nighttime second degree type II  block: Noted on monitor in 2024.  No symptoms at this time.  No further workup or medications necessary at this time. Okay to continue metoprolol  succinate.  Hypertension: Well-controlled. Continue lisinopril , hydrochlorothiazide   Mixed hyperlipidemia:  Well-controlled.  Continue rosuvastatin  40 mg daily.     Continue follow-up with PCP Dr. Shelda.  I will see her as needed.  Signed, Newman JINNY Lawrence, MD

## 2024-07-10 NOTE — Patient Instructions (Signed)
 Follow-Up: At [facility], you and your health needs are our priority.  As part of our continuing mission to provide you with exceptional heart care, our providers are all part of one team.  This team includes your primary Cardiologist (physician) and Advanced Practice Providers or APPs (Physician Assistants and Nurse Practitioners) who all work together to provide you with the care you need, when you need it.  Your next appointment:   As needed   Provider:   Cody Das, MD    We recommend signing up for the patient portal called "MyChart".  Sign up information is provided on this After Visit Summary.  MyChart is used to connect with patients for Virtual Visits (Telemedicine).  Patients are able to view lab/test results, encounter notes, upcoming appointments, etc.  Non-urgent messages can be sent to your provider as well.   To learn more about what you can do with MyChart, go to [email or web address].

## 2024-08-02 ENCOUNTER — Ambulatory Visit (INDEPENDENT_AMBULATORY_CARE_PROVIDER_SITE_OTHER): Admitting: Podiatry

## 2024-08-02 ENCOUNTER — Encounter: Payer: Self-pay | Admitting: Podiatry

## 2024-08-02 DIAGNOSIS — E119 Type 2 diabetes mellitus without complications: Secondary | ICD-10-CM

## 2024-08-02 DIAGNOSIS — B351 Tinea unguium: Secondary | ICD-10-CM | POA: Diagnosis not present

## 2024-08-02 NOTE — Progress Notes (Signed)
 This patient returns to my office for at risk foot care.  This patient requires this care by a professional since this patient will be at risk due to having diabetes. This patient is unable to cut nails herself since the patient cannot reach her nails.These nails are painful walking and wearing shoes.  This patient presents for at risk foot care today.  General Appearance  Alert, conversant and in no acute stress.  Vascular  Dorsalis pedis and posterior tibial  pulses are palpable  bilaterally.  Capillary return is within normal limits  bilaterally. Temperature is within normal limits  bilaterally.  Neurologic  Senn-Weinstein monofilament wire test within normal limits  bilaterally. Muscle power within normal limits bilaterally.  Nails Thick disfigured discolored nails with subungual debris  from hallux to fifth toes bilaterally. No evidence of bacterial infection or drainage bilaterally. Pincer hallux nails.    Orthopedic  No limitations of motion  feet .  No crepitus or effusions noted.  No bony pathology or digital deformities noted.  Swelling ankles  B/L.  Skin  normotropic skin with no porokeratosis noted bilaterally.  No signs of infections or ulcers noted.     Onychomycosis  Pain in right toes  Pain in left toes  Consent was obtained for treatment procedures.   Mechanical debridement of nails 1-5  bilaterally performed with a nail nipper.  Filed with dremel without incident. Discussed her ankle swelling.  Consider anklet.   Return office visit   3 months                   Told patient to return for periodic foot care and evaluation due to potential at risk complications.   Cordella Bold DPM

## 2024-09-14 ENCOUNTER — Other Ambulatory Visit: Payer: Medicare HMO

## 2024-11-02 ENCOUNTER — Encounter: Payer: Self-pay | Admitting: Podiatry

## 2024-11-02 ENCOUNTER — Ambulatory Visit (INDEPENDENT_AMBULATORY_CARE_PROVIDER_SITE_OTHER): Admitting: Podiatry

## 2024-11-02 DIAGNOSIS — E119 Type 2 diabetes mellitus without complications: Secondary | ICD-10-CM | POA: Diagnosis not present

## 2024-11-02 DIAGNOSIS — B351 Tinea unguium: Secondary | ICD-10-CM | POA: Diagnosis not present

## 2024-11-02 NOTE — Progress Notes (Signed)
 This patient returns to my office for at risk foot care.  This patient requires this care by a professional since this patient will be at risk due to having diabetes. This patient is unable to cut nails herself since the patient cannot reach her nails.These nails are painful walking and wearing shoes.  This patient presents for at risk foot care today.  General Appearance  Alert, conversant and in no acute stress.  Vascular  Dorsalis pedis and posterior tibial  pulses are palpable  bilaterally.  Capillary return is within normal limits  bilaterally. Temperature is within normal limits  bilaterally.  Neurologic  Senn-Weinstein monofilament wire test within normal limits  bilaterally. Muscle power within normal limits bilaterally.  Nails Thick disfigured discolored nails with subungual debris  from hallux to fifth toes bilaterally. No evidence of bacterial infection or drainage bilaterally. Pincer hallux nails.    Orthopedic  No limitations of motion  feet .  No crepitus or effusions noted.  No bony pathology or digital deformities noted.    Skin  normotropic skin with no porokeratosis noted bilaterally.  No signs of infections or ulcers noted.     Onychomycosis  Pain in right toes  Pain in left toes  Consent was obtained for treatment procedures.   Mechanical debridement of nails 1-5  bilaterally performed with a nail nipper.  Filed with dremel without incident.    Return office visit   3 months                   Told patient to return for periodic foot care and evaluation due to potential at risk complications.   Cordella Bold DPM

## 2025-02-02 ENCOUNTER — Ambulatory Visit: Admitting: Podiatry

## 2025-05-10 ENCOUNTER — Encounter (INDEPENDENT_AMBULATORY_CARE_PROVIDER_SITE_OTHER): Admitting: Ophthalmology
